# Patient Record
Sex: Female | Born: 1950
Health system: Southern US, Community
[De-identification: ages and names within clinical notes are randomized; demographics above are authoritative.]

## PROBLEM LIST (undated history)

## (undated) DIAGNOSIS — F32A Depression, unspecified: Secondary | ICD-10-CM

## (undated) DIAGNOSIS — M810 Age-related osteoporosis without current pathological fracture: Secondary | ICD-10-CM

## (undated) DIAGNOSIS — G5680 Other specified mononeuropathies of unspecified upper limb: Secondary | ICD-10-CM

## (undated) DIAGNOSIS — J189 Pneumonia, unspecified organism: Secondary | ICD-10-CM

## (undated) DIAGNOSIS — J449 Chronic obstructive pulmonary disease, unspecified: Secondary | ICD-10-CM

## (undated) DIAGNOSIS — K219 Gastro-esophageal reflux disease without esophagitis: Secondary | ICD-10-CM

## (undated) DIAGNOSIS — F329 Major depressive disorder, single episode, unspecified: Secondary | ICD-10-CM

## (undated) DIAGNOSIS — R519 Headache, unspecified: Secondary | ICD-10-CM

## (undated) DIAGNOSIS — I10 Essential (primary) hypertension: Secondary | ICD-10-CM

## (undated) DIAGNOSIS — G8929 Other chronic pain: Secondary | ICD-10-CM

## (undated) DIAGNOSIS — Z860100 Personal history of colon polyps, unspecified: Secondary | ICD-10-CM

## (undated) DIAGNOSIS — R200 Anesthesia of skin: Secondary | ICD-10-CM

## (undated) DIAGNOSIS — F419 Anxiety disorder, unspecified: Secondary | ICD-10-CM

## (undated) DIAGNOSIS — F1910 Other psychoactive substance abuse, uncomplicated: Secondary | ICD-10-CM

## (undated) DIAGNOSIS — M2550 Pain in unspecified joint: Secondary | ICD-10-CM

## (undated) DIAGNOSIS — M549 Dorsalgia, unspecified: Secondary | ICD-10-CM

## (undated) DIAGNOSIS — E785 Hyperlipidemia, unspecified: Secondary | ICD-10-CM

## (undated) DIAGNOSIS — Z8619 Personal history of other infectious and parasitic diseases: Secondary | ICD-10-CM

## (undated) DIAGNOSIS — IMO0002 Reserved for concepts with insufficient information to code with codable children: Secondary | ICD-10-CM

## (undated) DIAGNOSIS — Z8709 Personal history of other diseases of the respiratory system: Secondary | ICD-10-CM

## (undated) DIAGNOSIS — K759 Inflammatory liver disease, unspecified: Secondary | ICD-10-CM

## (undated) DIAGNOSIS — R51 Headache: Secondary | ICD-10-CM

## (undated) DIAGNOSIS — D332 Benign neoplasm of brain, unspecified: Secondary | ICD-10-CM

## (undated) DIAGNOSIS — T40601A Poisoning by unspecified narcotics, accidental (unintentional), initial encounter: Secondary | ICD-10-CM

## (undated) HISTORY — PX: COLONOSCOPY: SHX174

## (undated) HISTORY — PX: TUBAL LIGATION: SHX77

## (undated) HISTORY — PX: ANKLE FRACTURE SURGERY: SHX122

## (undated) HISTORY — PX: FACIAL FRACTURE SURGERY: SHX1570

## (undated) HISTORY — PX: KNEE SURGERY: SHX244

---

## 2000-04-19 ENCOUNTER — Other Ambulatory Visit: Admission: RE | Admit: 2000-04-19 | Discharge: 2000-04-19 | Payer: Self-pay | Admitting: Family Medicine

## 2001-03-07 ENCOUNTER — Ambulatory Visit (HOSPITAL_COMMUNITY): Admission: RE | Admit: 2001-03-07 | Discharge: 2001-03-07 | Payer: Self-pay | Admitting: Gastroenterology

## 2001-11-08 ENCOUNTER — Encounter: Admission: RE | Admit: 2001-11-08 | Discharge: 2001-11-08 | Payer: Self-pay | Admitting: Family Medicine

## 2001-11-08 ENCOUNTER — Encounter: Payer: Self-pay | Admitting: Family Medicine

## 2001-11-13 ENCOUNTER — Encounter: Admission: RE | Admit: 2001-11-13 | Discharge: 2001-12-28 | Payer: Self-pay | Admitting: Family Medicine

## 2002-02-12 ENCOUNTER — Encounter: Admission: RE | Admit: 2002-02-12 | Discharge: 2002-02-12 | Payer: Self-pay | Admitting: Orthopaedic Surgery

## 2002-02-12 ENCOUNTER — Encounter: Payer: Self-pay | Admitting: Orthopaedic Surgery

## 2002-07-03 ENCOUNTER — Encounter: Payer: Self-pay | Admitting: Emergency Medicine

## 2002-07-03 ENCOUNTER — Inpatient Hospital Stay (HOSPITAL_COMMUNITY): Admission: AC | Admit: 2002-07-03 | Discharge: 2002-07-10 | Payer: Self-pay

## 2002-07-04 ENCOUNTER — Encounter: Payer: Self-pay | Admitting: Orthopedic Surgery

## 2002-07-05 ENCOUNTER — Encounter: Payer: Self-pay | Admitting: Orthopedic Surgery

## 2002-07-10 ENCOUNTER — Inpatient Hospital Stay (HOSPITAL_COMMUNITY)
Admission: RE | Admit: 2002-07-10 | Discharge: 2002-07-16 | Payer: Self-pay | Admitting: Physical Medicine & Rehabilitation

## 2002-09-12 ENCOUNTER — Ambulatory Visit (HOSPITAL_BASED_OUTPATIENT_CLINIC_OR_DEPARTMENT_OTHER): Admission: RE | Admit: 2002-09-12 | Discharge: 2002-09-13 | Payer: Self-pay | Admitting: Orthopedic Surgery

## 2003-03-13 ENCOUNTER — Ambulatory Visit (HOSPITAL_COMMUNITY): Admission: RE | Admit: 2003-03-13 | Discharge: 2003-03-14 | Payer: Self-pay | Admitting: Orthopedic Surgery

## 2003-07-15 ENCOUNTER — Emergency Department (HOSPITAL_COMMUNITY): Admission: EM | Admit: 2003-07-15 | Discharge: 2003-07-15 | Payer: Self-pay | Admitting: Emergency Medicine

## 2003-08-08 ENCOUNTER — Inpatient Hospital Stay (HOSPITAL_COMMUNITY): Admission: RE | Admit: 2003-08-08 | Discharge: 2003-08-12 | Payer: Self-pay | Admitting: Orthopedic Surgery

## 2003-11-05 ENCOUNTER — Encounter
Admission: RE | Admit: 2003-11-05 | Discharge: 2004-02-03 | Payer: Self-pay | Admitting: Physical Medicine and Rehabilitation

## 2003-11-12 ENCOUNTER — Encounter
Admission: RE | Admit: 2003-11-12 | Discharge: 2003-11-19 | Payer: Self-pay | Admitting: Physical Medicine and Rehabilitation

## 2004-02-03 ENCOUNTER — Encounter
Admission: RE | Admit: 2004-02-03 | Discharge: 2004-05-03 | Payer: Self-pay | Admitting: Physical Medicine and Rehabilitation

## 2004-04-30 ENCOUNTER — Encounter
Admission: RE | Admit: 2004-04-30 | Discharge: 2004-07-29 | Payer: Self-pay | Admitting: Physical Medicine and Rehabilitation

## 2006-06-03 ENCOUNTER — Emergency Department (HOSPITAL_COMMUNITY): Admission: EM | Admit: 2006-06-03 | Discharge: 2006-06-03 | Payer: Self-pay | Admitting: Emergency Medicine

## 2008-02-15 ENCOUNTER — Ambulatory Visit (HOSPITAL_COMMUNITY): Payer: Self-pay | Admitting: Psychiatry

## 2008-03-12 ENCOUNTER — Ambulatory Visit (HOSPITAL_COMMUNITY): Payer: Self-pay | Admitting: Psychiatry

## 2008-03-21 ENCOUNTER — Ambulatory Visit (HOSPITAL_COMMUNITY): Admission: RE | Admit: 2008-03-21 | Discharge: 2008-03-21 | Payer: Self-pay | Admitting: Family Medicine

## 2008-04-18 ENCOUNTER — Emergency Department (HOSPITAL_COMMUNITY): Admission: EM | Admit: 2008-04-18 | Discharge: 2008-04-18 | Payer: Self-pay | Admitting: Emergency Medicine

## 2008-05-23 ENCOUNTER — Ambulatory Visit (HOSPITAL_COMMUNITY): Payer: Self-pay | Admitting: Psychiatry

## 2008-06-02 ENCOUNTER — Emergency Department (HOSPITAL_COMMUNITY): Admission: EM | Admit: 2008-06-02 | Discharge: 2008-06-02 | Payer: Self-pay | Admitting: Emergency Medicine

## 2008-06-29 ENCOUNTER — Emergency Department (HOSPITAL_COMMUNITY): Admission: EM | Admit: 2008-06-29 | Discharge: 2008-06-29 | Payer: Self-pay | Admitting: Emergency Medicine

## 2008-07-06 ENCOUNTER — Emergency Department (HOSPITAL_COMMUNITY): Admission: EM | Admit: 2008-07-06 | Discharge: 2008-07-07 | Payer: Self-pay | Admitting: Emergency Medicine

## 2009-05-16 ENCOUNTER — Emergency Department (HOSPITAL_COMMUNITY): Admission: EM | Admit: 2009-05-16 | Discharge: 2009-05-16 | Payer: Self-pay | Admitting: Emergency Medicine

## 2009-06-01 ENCOUNTER — Emergency Department (HOSPITAL_COMMUNITY): Admission: EM | Admit: 2009-06-01 | Discharge: 2009-06-01 | Payer: Self-pay | Admitting: Emergency Medicine

## 2009-06-14 ENCOUNTER — Emergency Department (HOSPITAL_COMMUNITY): Admission: EM | Admit: 2009-06-14 | Discharge: 2009-06-14 | Payer: Self-pay | Admitting: Emergency Medicine

## 2009-07-24 ENCOUNTER — Emergency Department (HOSPITAL_COMMUNITY): Admission: EM | Admit: 2009-07-24 | Discharge: 2009-07-24 | Payer: Self-pay | Admitting: Emergency Medicine

## 2009-09-18 ENCOUNTER — Emergency Department (HOSPITAL_COMMUNITY): Admission: EM | Admit: 2009-09-18 | Discharge: 2009-09-18 | Payer: Self-pay | Admitting: Emergency Medicine

## 2009-10-08 ENCOUNTER — Ambulatory Visit: Payer: Self-pay | Admitting: Physician Assistant

## 2009-10-19 ENCOUNTER — Emergency Department (HOSPITAL_COMMUNITY): Admission: EM | Admit: 2009-10-19 | Discharge: 2009-10-19 | Payer: Self-pay | Admitting: Emergency Medicine

## 2009-11-07 ENCOUNTER — Emergency Department (HOSPITAL_COMMUNITY): Admission: EM | Admit: 2009-11-07 | Discharge: 2009-11-07 | Payer: Self-pay | Admitting: Emergency Medicine

## 2009-12-06 ENCOUNTER — Emergency Department (HOSPITAL_COMMUNITY): Admission: EM | Admit: 2009-12-06 | Discharge: 2009-12-06 | Payer: Self-pay | Admitting: Emergency Medicine

## 2009-12-24 ENCOUNTER — Emergency Department (HOSPITAL_COMMUNITY): Admission: EM | Admit: 2009-12-24 | Discharge: 2009-12-24 | Payer: Self-pay | Admitting: Emergency Medicine

## 2010-01-06 ENCOUNTER — Emergency Department (HOSPITAL_COMMUNITY): Admission: EM | Admit: 2010-01-06 | Discharge: 2010-01-06 | Payer: Self-pay | Admitting: Emergency Medicine

## 2010-02-17 ENCOUNTER — Emergency Department (HOSPITAL_COMMUNITY): Admission: EM | Admit: 2010-02-17 | Discharge: 2010-02-17 | Payer: Self-pay | Admitting: Emergency Medicine

## 2010-02-25 ENCOUNTER — Emergency Department (HOSPITAL_COMMUNITY): Admission: EM | Admit: 2010-02-25 | Discharge: 2010-02-25 | Payer: Self-pay | Admitting: Emergency Medicine

## 2010-03-17 ENCOUNTER — Ambulatory Visit (HOSPITAL_COMMUNITY): Admission: RE | Admit: 2010-03-17 | Discharge: 2010-03-17 | Payer: Self-pay | Admitting: Family Medicine

## 2010-04-12 ENCOUNTER — Emergency Department (HOSPITAL_COMMUNITY): Admission: EM | Admit: 2010-04-12 | Discharge: 2010-04-12 | Payer: Self-pay | Admitting: Emergency Medicine

## 2010-07-04 ENCOUNTER — Emergency Department (HOSPITAL_COMMUNITY): Admission: EM | Admit: 2010-07-04 | Discharge: 2010-07-04 | Payer: Self-pay | Admitting: Emergency Medicine

## 2010-08-03 ENCOUNTER — Emergency Department (HOSPITAL_COMMUNITY): Admission: EM | Admit: 2010-08-03 | Discharge: 2010-08-03 | Payer: Self-pay | Admitting: Emergency Medicine

## 2010-08-24 ENCOUNTER — Emergency Department (HOSPITAL_COMMUNITY): Admission: EM | Admit: 2010-08-24 | Discharge: 2010-08-24 | Payer: Self-pay | Admitting: Emergency Medicine

## 2010-10-06 ENCOUNTER — Emergency Department (HOSPITAL_COMMUNITY): Admission: EM | Admit: 2010-10-06 | Discharge: 2010-10-06 | Payer: Self-pay | Admitting: Emergency Medicine

## 2010-10-18 ENCOUNTER — Emergency Department (HOSPITAL_COMMUNITY): Admission: EM | Admit: 2010-10-18 | Discharge: 2010-10-18 | Payer: Self-pay | Admitting: Emergency Medicine

## 2010-11-09 ENCOUNTER — Encounter
Admission: RE | Admit: 2010-11-09 | Discharge: 2010-11-17 | Payer: Self-pay | Admitting: Physical Medicine & Rehabilitation

## 2010-11-17 ENCOUNTER — Ambulatory Visit: Payer: Self-pay | Admitting: Physical Medicine & Rehabilitation

## 2010-11-18 ENCOUNTER — Emergency Department (HOSPITAL_COMMUNITY)
Admission: EM | Admit: 2010-11-18 | Discharge: 2010-11-18 | Payer: Self-pay | Source: Home / Self Care | Admitting: Emergency Medicine

## 2010-12-07 ENCOUNTER — Emergency Department (HOSPITAL_COMMUNITY)
Admission: EM | Admit: 2010-12-07 | Discharge: 2010-12-07 | Payer: Self-pay | Source: Home / Self Care | Admitting: Emergency Medicine

## 2010-12-20 ENCOUNTER — Inpatient Hospital Stay (HOSPITAL_COMMUNITY)
Admission: EM | Admit: 2010-12-20 | Discharge: 2010-12-24 | Payer: Self-pay | Source: Home / Self Care | Attending: Internal Medicine | Admitting: Internal Medicine

## 2010-12-20 DIAGNOSIS — J189 Pneumonia, unspecified organism: Secondary | ICD-10-CM

## 2010-12-20 HISTORY — DX: Pneumonia, unspecified organism: J18.9

## 2010-12-22 ENCOUNTER — Other Ambulatory Visit: Payer: Self-pay | Admitting: Internal Medicine

## 2010-12-23 LAB — CBC
HCT: 33.5 % — ABNORMAL LOW (ref 36.0–46.0)
Hemoglobin: 11.4 g/dL — ABNORMAL LOW (ref 12.0–15.0)
MCH: 31.6 pg (ref 26.0–34.0)
MCHC: 34 g/dL (ref 30.0–36.0)
MCV: 92.8 fL (ref 78.0–100.0)
Platelets: 272 10*3/uL (ref 150–400)
RBC: 3.61 MIL/uL — ABNORMAL LOW (ref 3.87–5.11)
RDW: 14.3 % (ref 11.5–15.5)
WBC: 9.2 10*3/uL (ref 4.0–10.5)

## 2010-12-23 LAB — DIFFERENTIAL
Basophils Absolute: 0.1 10*3/uL (ref 0.0–0.1)
Basophils Relative: 1 % (ref 0–1)
Eosinophils Absolute: 0.6 10*3/uL (ref 0.0–0.7)
Eosinophils Relative: 6 % — ABNORMAL HIGH (ref 0–5)
Lymphocytes Relative: 19 % (ref 12–46)
Lymphs Abs: 1.7 10*3/uL (ref 0.7–4.0)
Monocytes Absolute: 0.8 10*3/uL (ref 0.1–1.0)
Monocytes Relative: 9 % (ref 3–12)
Neutro Abs: 6.1 10*3/uL (ref 1.7–7.7)
Neutrophils Relative %: 66 % (ref 43–77)

## 2011-01-10 ENCOUNTER — Encounter: Payer: Self-pay | Admitting: Family Medicine

## 2011-01-27 ENCOUNTER — Emergency Department (HOSPITAL_COMMUNITY)
Admission: EM | Admit: 2011-01-27 | Discharge: 2011-01-27 | Disposition: A | Discharge status: Home / Self Care | Payer: MEDICARE | Attending: Emergency Medicine | Admitting: Emergency Medicine

## 2011-01-27 ENCOUNTER — Emergency Department (HOSPITAL_COMMUNITY): Payer: MEDICARE

## 2011-01-27 DIAGNOSIS — Y92009 Unspecified place in unspecified non-institutional (private) residence as the place of occurrence of the external cause: Secondary | ICD-10-CM | POA: Insufficient documentation

## 2011-01-27 DIAGNOSIS — M25579 Pain in unspecified ankle and joints of unspecified foot: Secondary | ICD-10-CM | POA: Insufficient documentation

## 2011-01-27 DIAGNOSIS — X500XXA Overexertion from strenuous movement or load, initial encounter: Secondary | ICD-10-CM | POA: Insufficient documentation

## 2011-02-04 ENCOUNTER — Emergency Department (HOSPITAL_COMMUNITY)
Admission: EM | Admit: 2011-02-04 | Discharge: 2011-02-04 | Disposition: A | Discharge status: Home / Self Care | Payer: MEDICARE | Attending: Emergency Medicine | Admitting: Emergency Medicine

## 2011-02-04 DIAGNOSIS — M25579 Pain in unspecified ankle and joints of unspecified foot: Secondary | ICD-10-CM | POA: Insufficient documentation

## 2011-03-01 LAB — PROTIME-INR
INR: 1.1 (ref 0.00–1.49)
Prothrombin Time: 14.4 seconds (ref 11.6–15.2)

## 2011-03-01 LAB — LEGIONELLA ANTIGEN, URINE: Legionella Antigen, Urine: NEGATIVE

## 2011-03-01 LAB — DIFFERENTIAL
Basophils Absolute: 0 K/uL (ref 0.0–0.1)
Basophils Absolute: 0.1 10*3/uL (ref 0.0–0.1)
Basophils Relative: 0 % (ref 0–1)
Basophils Relative: 0 % (ref 0–1)
Eosinophils Absolute: 0.4 K/uL (ref 0.0–0.7)
Eosinophils Relative: 3 % (ref 0–5)
Lymphocytes Relative: 14 % (ref 12–46)
Lymphs Abs: 1.6 K/uL (ref 0.7–4.0)
Lymphs Abs: 2.1 10*3/uL (ref 0.7–4.0)
Monocytes Absolute: 0.7 K/uL (ref 0.1–1.0)
Monocytes Relative: 6 % (ref 3–12)
Neutro Abs: 8.8 K/uL — ABNORMAL HIGH (ref 1.7–7.7)
Neutrophils Relative %: 76 % (ref 43–77)

## 2011-03-01 LAB — CBC
HCT: 31.3 % — ABNORMAL LOW (ref 36.0–46.0)
HCT: 35.7 % — ABNORMAL LOW (ref 36.0–46.0)
Hemoglobin: 10.4 g/dL — ABNORMAL LOW (ref 12.0–15.0)
Hemoglobin: 12 g/dL (ref 12.0–15.0)
MCH: 31 pg (ref 26.0–34.0)
MCHC: 33.2 g/dL (ref 30.0–36.0)
MCHC: 33.6 g/dL (ref 30.0–36.0)
MCV: 93.4 fL (ref 78.0–100.0)
MCV: 93.9 fL (ref 78.0–100.0)
Platelets: 211 10*3/uL (ref 150–400)
RBC: 3.35 MIL/uL — ABNORMAL LOW (ref 3.87–5.11)
RBC: 3.8 MIL/uL — ABNORMAL LOW (ref 3.87–5.11)
RDW: 14.3 % (ref 11.5–15.5)

## 2011-03-01 LAB — RENAL FUNCTION PANEL
CO2: 25 mEq/L (ref 19–32)
Calcium: 8.4 mg/dL (ref 8.4–10.5)
Glucose, Bld: 104 mg/dL — ABNORMAL HIGH (ref 70–99)
Sodium: 144 mEq/L (ref 135–145)

## 2011-03-01 LAB — CULTURE, BLOOD (ROUTINE X 2)
Culture  Setup Time: 201201012053
Culture  Setup Time: 201201012053
Culture: NO GROWTH
Culture: NO GROWTH

## 2011-03-01 LAB — STREP PNEUMONIAE URINARY ANTIGEN: Strep Pneumo Urinary Antigen: NEGATIVE

## 2011-03-01 LAB — BASIC METABOLIC PANEL
GFR calc non Af Amer: >60 mL/min (ref >60)
Glucose, Bld: 91 mg/dL (ref 70–99)
Sodium: 140 mEq/L (ref 135–145)

## 2011-03-01 LAB — URINALYSIS, ROUTINE W REFLEX MICROSCOPIC
Bilirubin Urine: NEGATIVE
Glucose, UA: NEGATIVE mg/dL
Hgb urine dipstick: NEGATIVE
Protein, ur: NEGATIVE mg/dL
Specific Gravity, Urine: 1.006 (ref 1.005–1.030)
Urobilinogen, UA: 0.2 mg/dL (ref 0.0–1.0)

## 2011-03-01 LAB — PROCALCITONIN

## 2011-03-01 LAB — BRAIN NATRIURETIC PEPTIDE: Pro B Natriuretic peptide (BNP): 72 pg/mL (ref 0.0–100.0)

## 2011-03-01 LAB — URINE CULTURE
Colony Count: NO GROWTH
Culture  Setup Time: 201201012121

## 2011-03-01 LAB — COMPREHENSIVE METABOLIC PANEL
ALT: 25 U/L (ref 0–35)
GFR calc Af Amer: >60 mL/min (ref >60)
Sodium: 143 mEq/L (ref 135–145)
Total Bilirubin: 0.3 mg/dL (ref 0.3–1.2)

## 2011-03-01 LAB — LIPID PANEL
Triglycerides: 85 mg/dL (ref <150)
VLDL: 17 mg/dL (ref 0–40)

## 2011-03-01 LAB — LACTIC ACID, PLASMA
Lactic Acid, Venous: 0.7 mmol/L (ref 0.5–2.2)
Lactic Acid, Venous: 1.8 mmol/L (ref 0.5–2.2)

## 2011-03-01 LAB — APTT: aPTT: 33 seconds (ref 24–37)

## 2011-04-01 ENCOUNTER — Emergency Department (HOSPITAL_COMMUNITY)
Admission: EM | Admit: 2011-04-01 | Discharge: 2011-04-01 | Disposition: A | Discharge status: Home / Self Care | Payer: MEDICARE | Attending: Emergency Medicine | Admitting: Emergency Medicine

## 2011-04-01 DIAGNOSIS — M549 Dorsalgia, unspecified: Secondary | ICD-10-CM | POA: Insufficient documentation

## 2011-04-01 DIAGNOSIS — E7800 Pure hypercholesterolemia, unspecified: Secondary | ICD-10-CM | POA: Insufficient documentation

## 2011-04-01 DIAGNOSIS — M25579 Pain in unspecified ankle and joints of unspecified foot: Secondary | ICD-10-CM | POA: Insufficient documentation

## 2011-04-01 DIAGNOSIS — F329 Major depressive disorder, single episode, unspecified: Secondary | ICD-10-CM | POA: Insufficient documentation

## 2011-04-01 DIAGNOSIS — F3289 Other specified depressive episodes: Secondary | ICD-10-CM | POA: Insufficient documentation

## 2011-04-01 DIAGNOSIS — M810 Age-related osteoporosis without current pathological fracture: Secondary | ICD-10-CM | POA: Insufficient documentation

## 2011-04-01 DIAGNOSIS — G8929 Other chronic pain: Secondary | ICD-10-CM | POA: Insufficient documentation

## 2011-04-01 DIAGNOSIS — I10 Essential (primary) hypertension: Secondary | ICD-10-CM | POA: Insufficient documentation

## 2011-05-07 NOTE — Assessment & Plan Note (Signed)
[name] is back in for a recheck today.  Her problem list includes the  following:  1. Cervical stenosis.  2. Chronic low back pain.  3. Right pylon fracture, status post revision.  4. Bilateral carpal tunnel syndrome.   Current medications include:  1. Effexor 75 mg p.o. b.i.d.  2. Tylox 5/500, 2 daily.  3. OxyContin 20 mg b.i.d.  4. Xanax 2 mg b.i.d. to 3 times daily.   She recently called in for a refill on her medications on December 17, 2003.  At that time OxyContin was referred as well as the Tylox.  She was given 62  units each.   Overall, her pain scores have improved since her last visit.  Her average  daily pain is about a 5, and prior to that her average daily pain had been a  7.  She reports her best time of day is after lunch.  She has been staying  quite active.  She stays very busy and was especially busy during the  holidays.  Her back pain is located right across the low back area just  above the gluteal crevice.   On exam today her blood pressure was 124/65, pulse 67, 99% saturation on  room air.  She appeared comfortable as she talked to me during our  interview.  Her mood was bright.  She was able to get out of the chair  easily.  Her right lower extremity, again, does impede her normal gait, and  she has somewhat of an antalgic gait.  She has limitation, especially in  extension, with her lumbar mobility.  Seated, her reflexes were 2+ at the  knees, 1+ at the ankles.  Toes are downgoing.  There is no clonus noted.  Sensation was intact, and motor strength was 5/5.   IMPRESSION:  Overall stable with pain management on current medications.  Will continue these medications.  We will see her back again in 4-6 weeks.  She noted also during the interview that she is going to be seeing Dr. Lajoyce Corners  on December 30, 2003, for him to look at her right ankle again.      Brantley Stage, M.D.   DMK/MedQ  D:  12/25/2003 18:22:48  T:  12/26/2003 06:55:20  Job  #:  [id number]

## 2011-05-07 NOTE — Op Note (Signed)
   [name], [name]                        ACCOUNT NO.:  [id number]   MEDICAL RECORD NO.:  [id number]                   PATIENT TYPE:  AMB   LOCATION:  DSC                                  FACILITY:  [facility]   PHYSICIAN:  Danise Edge, M.D.                DATE OF BIRTH:  Sep 08, 1951   DATE OF PROCEDURE:  09/26/2002  DATE OF DISCHARGE:  09/13/2002                                 OPERATIVE REPORT   PROCEDURE PERFORMED:  Esophagogastroduodenoscopy.   REFERRING PHYSICIAN:  Richard A. Jacky Kindle, M.D.   ENDOSCOPIST:  Charolett Bumpers, M.D.   INDICATIONS FOR PROCEDURE:  The patient is a 60 year old female born May 29, 1933.  [name] was admitted through the [facility] emergency  department last night with a bleeding gastric cardia, Dieulafoy's lesion.  I  was able to inject this arterial bleeder with epinephrine through a  sclerotherapy needle and applied endo clips.  Unfortunately, the endoscopic  view of this lesion is tangential making endoclipping difficult and probably  somewhat ineffective.  The patient redeveloped upper gastrointestinal  bleeding this morning.   PROCEDURE PERFORMED:  Esophagogastroduodenoscopy.   ENDOSCOPIST:  Charolett Bumpers, M.D.   PREMEDICATION:  Fentanyl 25 mcg, Versed 5 mg.   INSTRUMENT USED:  Olympus gastroscope.   DESCRIPTION OF PROCEDURE:  The patient was placed in the left lateral  decubitus position.  I administered fentanyl and intravenous Versed to  achieve conscious sedation for the procedure.  The patient's blood pressure,  oxygen saturations and cardiac rhythm were monitored throughout the  procedure and documented in the medical record.   The Olympus gastroscope was passed through the posterior hypopharynx and the  proximal esophagus without difficulty.  The hypopharynx, larynx and vocal  cords appeared normal.   Esophagoscopy:  The proximal, mid and lower segments of the esophagus appear  normal.   Gastroscopy:  Upon  entering the stomach, the stomach is filled with fresh  blood and blood clots.  I was able to identify the gastric cardia,  Dieulafoy's lesion on the greater curvature aspect with endo clips in place.  An additional endo clip was applied.  The gastric body, antrum and pylorus  appeared normal.   Duodenoscopy:  The duodenal bulb and descending duodenum appeared normal.   PLAN:  Dr. Ezzard Standing is at the patient's bedside and gastric surgery is planned  on an emergent basis today.                                               Danise Edge, M.D.    MJ/MEDQ  D:  09/26/2002  T:  09/26/2002  Job:  [id number]
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
## 2011-05-07 NOTE — Discharge Summary (Signed)
   [name], [name]                        ACCOUNT NO.:  [id number]   MEDICAL RECORD NO.:  [id number]                   PATIENT TYPE:  INP   LOCATION:  5004                                 FACILITY:  [facility]   PHYSICIAN:  Nadara Mustard, M.D.                DATE OF BIRTH:  Jan 16, 1951   DATE OF ADMISSION:  08/08/2003  DATE OF DISCHARGE:  08/12/2003                                 DISCHARGE SUMMARY   DIAGNOSIS:  Osteomyelitis and abscess with retained hardware, right ankle.   PROCEDURE:  Incision and drainage of skin and soft tissue, removal of  infected bone, removal of hardware.   DISPOSITION:  Discharged to home in stable condition.   HISTORY OF PRESENT ILLNESS:  The patient is a 60 year old woman who is  status post subtalar fusion with painful retained hardware. Radiograph shows  lytic changes around the retained hardware, shows a stable fusion, and she  presents at this time for removal of retained hardware, cultures, and IV  antibiotics.   HOSPITAL COURSE:  The patient's hospital course was essentially  unremarkable. She underwent irrigation and debridement of skin and soft  tissue with removal of infected bone and removal of retained hardware on  August 19. Cultures were obtained x2. Tourniquet time none. The patient was  placed on IV Zosyn postoperatively, Kefzol preoperatively. Cultures were  negative on postoperative day #1 and remained negative throughout her  hospital stay. The patient was ambulating with physical therapy  approximately 100 feet. She was kept on Zosyn during her hospital course.  The drain was removed. The dressing was removed. The incision was clean and  dry. She was discharged to home in stable condition with a prescription for  Tylox. Plan to followup in one week. The patient will continue to use her  Cipro 500 mg two times a day after discharge. Followup in one week. Cultures  remained negative through date of discharge.                                 Nadara Mustard, M.D.    MVD/MEDQ  D:  09/10/2003  T:  09/11/2003  Job:  [id number]
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
## 2011-05-07 NOTE — Assessment & Plan Note (Signed)
REFERRING PHYSICIAN:  Nadara Mustard, M.D.   REASON FOR VISIT:  [name] is in for a recheck today.  She is being  followed for her chronic low back pain.  She reports that she has been a  little bit worse.  Her right foot was recently taken out of the cast boot.  She is status post a pylon fracture with revision for nonunion and she  reports it has been a little stiff and achy since it has been out of the  boot.  She has also been attending physical therapy and with the holidays  coming up already she feels a little overwhelmed with all that she has to do  with respect to Christmas and her daily activities and is wondering if we  might be able to give her the physical therapy for a month.   Today she denies any new problems.  No new numbness, tingling, weakness.  No  bowel or bladder problems.  Her gait is somewhat stiffer because of her  right foot.  She has been taking the OxyContin 20 mg p.o. b.i.d.  It helps  for several hours and she is sleeping okay when she takes it at 6 p.m. at  night, she has no problems during the night; she takes it also at 6 a.m.  It  is during the middle part of the day she is having some trouble.   She is also saying she has been a lot more anxious with the holidays coming  and is wondering if she can return back to her original dose of Xanax.   On exam she appears comfortable as she sits talking to me in the room today.  Her mood appears bright.  She is able to get out of the chair.  Her right  lower extremity is obviously bothering her somewhat as she walks as she has  a slightly antalgic gait and her right foot appears to be somewhat stiff as  she bears weight on it.  Back in the seated position her reflexes were 2+ at  the knees, 1+ at the ankles, toes are downgoing, no clonus is noted,  sensation is intact.  Motor strength is 5/5.   IMPRESSION:  1. Cervical stenosis.  2. Chronic low back pain.  3. Right pylon fracture status post  revision.  4. History of bilateral carpal tunnel syndrome.   PLAN:  Will continue her on OxyContin 20 mg p.o. b.i.d.  Will give her Tylox  5/500 one to two p.o. daily.  Will also refill her Xanax 2 mg one p.o.  t.i.d. p.r.n.  Will see her in about four weeks.  At that time we will  consider having her set up to see Dr. Leonides Cave to learn how to pace herself  and for an evaluation.  Also consider having her return to physical therapy  in about a month and at that time will have them finish up her home program  education as well as work on Estate manager/land agent and pacing issues.   MEDICATIONS WRITTEN FOR TODAY:  1. Xanax 2 mg one p.o. t.i.d. #93 units.  2. Tylox 5/500 one to two p.o. daily p.r.n. #62 units.  3. OxyContin 20 mg one p.o. q.12h. #62 units.      Brantley Stage, M.D.   DMK/MedQ  D:  11/20/2003 13:16:54  T:  11/20/2003 13:53:24  Job #:  [id number]   cc:   Nadara Mustard, M.D.  [address]  [place]  [place] [address]  Fax: [phone]

## 2011-05-07 NOTE — Assessment & Plan Note (Signed)
PROBLEM LIST:  1. Cervical stenosis.  2. Chronic low back pain.  3. Status post right pylon fracture.  4. History of bilateral carpal tunnel syndrome.   HISTORY OF PRESENT ILLNESS:  [name] ran out of her medications  approximately 2 days ago. She is somewhat upset in the office today but is  not experiencing any kind of severe withdrawal symptoms at this time. She  tells me that she will be following up with Dr. Charm Barges with an appointment  on Monday. Overall, she has been doing well. She has continued to be able to  negotiate her stairs, do her cleaning, and grocery shopping and the rest of  her activities of daily living without much difficulty. At the last visit,  she tells me that she had had some of her medications taken and apparently  was at a [employer] party.   PHYSICAL EXAMINATION:  VITAL SIGNS:  Blood pressure 125/77, pulse 82. She is  95% saturated on room air.  GENERAL:  She is appropriate, cooperative, somewhat anxious. However,  otherwise in no apparent distress.  SKIN:  Unremarkable.  NEUROLOGIC:  She is able to get off the examination table easily. Her gait  looks pretty good in the office today. No antalgia at all and weight bearing  fairly equally during the gait cycle. Seated, her reflexes are 2+ at the  upper extremities at biceps, triceps, brachial radialis and 2+ at the lower  extremities at the patellar tendons and Achilles tendon. Toes are downgoing.  No clonus is noted.  NECK:  She has good cervical range of motion. No adenopathy noted.  HEART:  Regular rhythm.  LUNGS:  Clear.  ABDOMEN:  Benign.  EXTREMITIES:  Her motor strength in the upper and lower extremities is in  the 5 over 5 range without any side-to-side weakness. Mild calf atrophy is  noted in the right lower extremity. No edema is noted. No significant  tenderness to palpation today in the right lower extremity.   PLAN:  Will be switching patient from OxyContin to Methadone 5 mg 1 p.o.  q.  12 hours, #60 tablets given. Will also give her Ultracet 2 tablets p.o. q. 4  hours p.r.n. #84, and Paxil 20 mg 1 p.o. q. a.m. #30. I discussed using  Methadone at length with her. I have told her that it is important that she  take it as directed, that as with any of the opiates, there is a possibility  if it is taken incorrectly, complications could arise including respiratory  depression and possibly death. She understands the risks and benefits of the  Methadone and would like to try it and she will give [place] a call back in about  2 weeks and let us know how she is doing. We may add a third dose, as we  titrate her on this particular medication. She was given some Ultracet to  bridge her while she starts on the Methadone. I will see her back in 1 month  otherwise.      Brantley Stage, M.D.   DMK/MedQ  D:  03/06/2004 18:44:24  T:  03/07/2004 14:15:52  Job #:  [id number]

## 2011-05-14 ENCOUNTER — Emergency Department (HOSPITAL_COMMUNITY)
Admission: EM | Admit: 2011-05-14 | Discharge: 2011-05-14 | Disposition: A | Discharge status: Home / Self Care | Payer: Medicare Other | Attending: Emergency Medicine | Admitting: Emergency Medicine

## 2011-05-14 DIAGNOSIS — K219 Gastro-esophageal reflux disease without esophagitis: Secondary | ICD-10-CM | POA: Insufficient documentation

## 2011-05-14 DIAGNOSIS — Z79899 Other long term (current) drug therapy: Secondary | ICD-10-CM | POA: Insufficient documentation

## 2011-05-14 DIAGNOSIS — M810 Age-related osteoporosis without current pathological fracture: Secondary | ICD-10-CM | POA: Insufficient documentation

## 2011-05-14 DIAGNOSIS — E7800 Pure hypercholesterolemia, unspecified: Secondary | ICD-10-CM | POA: Insufficient documentation

## 2011-05-14 DIAGNOSIS — I10 Essential (primary) hypertension: Secondary | ICD-10-CM | POA: Insufficient documentation

## 2011-05-14 DIAGNOSIS — IMO0002 Reserved for concepts with insufficient information to code with codable children: Secondary | ICD-10-CM | POA: Insufficient documentation

## 2011-05-14 DIAGNOSIS — J40 Bronchitis, not specified as acute or chronic: Secondary | ICD-10-CM | POA: Insufficient documentation

## 2011-05-14 DIAGNOSIS — F411 Generalized anxiety disorder: Secondary | ICD-10-CM | POA: Insufficient documentation

## 2011-09-16 LAB — URINALYSIS, ROUTINE W REFLEX MICROSCOPIC
Nitrite: NEGATIVE
Specific Gravity, Urine: 1.008
Urobilinogen, UA: 0.2

## 2011-12-18 ENCOUNTER — Encounter: Payer: Self-pay | Admitting: *Deleted

## 2011-12-18 ENCOUNTER — Emergency Department (HOSPITAL_COMMUNITY)
Admission: EM | Admit: 2011-12-18 | Discharge: 2011-12-18 | Disposition: A | Discharge status: Home / Self Care | Payer: Medicare Other | Attending: Emergency Medicine | Admitting: Emergency Medicine

## 2011-12-18 DIAGNOSIS — M545 Low back pain, unspecified: Secondary | ICD-10-CM | POA: Insufficient documentation

## 2011-12-18 DIAGNOSIS — IMO0002 Reserved for concepts with insufficient information to code with codable children: Secondary | ICD-10-CM | POA: Insufficient documentation

## 2011-12-18 DIAGNOSIS — M79609 Pain in unspecified limb: Secondary | ICD-10-CM | POA: Insufficient documentation

## 2011-12-18 DIAGNOSIS — E785 Hyperlipidemia, unspecified: Secondary | ICD-10-CM | POA: Insufficient documentation

## 2011-12-18 DIAGNOSIS — M62838 Other muscle spasm: Secondary | ICD-10-CM

## 2011-12-18 DIAGNOSIS — M5380 Other specified dorsopathies, site unspecified: Secondary | ICD-10-CM | POA: Insufficient documentation

## 2011-12-18 DIAGNOSIS — G8929 Other chronic pain: Secondary | ICD-10-CM

## 2011-12-18 DIAGNOSIS — I10 Essential (primary) hypertension: Secondary | ICD-10-CM | POA: Insufficient documentation

## 2011-12-18 DIAGNOSIS — F17200 Nicotine dependence, unspecified, uncomplicated: Secondary | ICD-10-CM | POA: Insufficient documentation

## 2011-12-18 HISTORY — DX: Hyperlipidemia, unspecified: E78.5

## 2011-12-18 HISTORY — DX: Essential (primary) hypertension: I10

## 2011-12-18 HISTORY — DX: Reserved for concepts with insufficient information to code with codable children: IMO0002

## 2011-12-18 HISTORY — DX: Benign neoplasm of brain, unspecified: D33.2

## 2011-12-18 HISTORY — DX: Other specified mononeuropathies of unspecified upper limb: G56.80

## 2011-12-18 MED ORDER — OXYCODONE-ACETAMINOPHEN 5-325 MG PO TABS: 1.0000 | ORAL_TABLET | Freq: Four times a day (QID) | ORAL | Status: AC | PRN

## 2011-12-18 MED ORDER — CYCLOBENZAPRINE HCL 10 MG PO TABS: 10.0000 mg | ORAL_TABLET | Freq: Two times a day (BID) | ORAL | Status: AC | PRN

## 2011-12-18 MED ORDER — OXYCODONE-ACETAMINOPHEN 5-325 MG PO TABS
1.0000 | ORAL_TABLET | Freq: Once | ORAL | Status: AC
  Administered 2011-12-18: 1 via ORAL
  Filled 2011-12-18: qty 1

## 2011-12-18 NOTE — ED Notes (Signed)
Pt lifted a church pew a couple of weeks ago , felt something "pop" in her lower back, symptoms are getting worse

## 2011-12-18 NOTE — ED Provider Notes (Signed)
History     CSN: [id number]  Arrival date & time 12/18/11  1659   First MD Initiated Contact with Patient 12/18/11 1833      Chief Complaint  Patient presents with  . Back Pain    (Consider location/radiation/quality/duration/timing/severity/associated sxs/prior treatment) HPI Comments: Patient with history of chronic right lower extremity and lower back pain -- presents with worsening pain in her right lower back without radiation for the past 10 days after lifting a pew at church. She states her back popped when she lifted. Pain is typical for her. Patient denies all red flag signs and symptoms of lower back pain except for age greater than 50. Patient admits to using more of her oxycodone than she's been prescribed. She is now out of this medication.   Patient is a 60 y.o. female presenting with back pain. The history is provided by the patient.  Back Pain  This is a chronic problem. The current episode started more than 1 week ago. The problem occurs constantly. The pain is associated with lifting heavy objects. The pain is present in the lumbar spine. The quality of the pain is described as cramping. The pain does not radiate. The symptoms are aggravated by twisting and bending. Pertinent negatives include no fever, no numbness, no weight loss, no bowel incontinence, no perianal numbness, no dysuria, no pelvic pain, no leg pain, no paresthesias, no tingling and no weakness.    Past Medical History  Diagnosis Date  . Disc degeneration   . Hypertension   . Hyperlipemia   . Brain tumor (benign)   . Pinched nerve in shoulder     Past Surgical History  Procedure Date  . Knee surgery   . Facial fracture surgery     No family history on file.  History  Substance Use Topics  . Smoking status: Current Everyday Smoker  . Smokeless tobacco: Not on file  . Alcohol Use: No    OB History    Grav Para Term Preterm Abortions TAB SAB Ect Mult Living                  Review of  Systems  Constitutional: Negative for fever and weight loss.  Gastrointestinal: Negative for constipation and bowel incontinence.       Negative for fecal incontinence.   Genitourinary: Negative for dysuria, hematuria, flank pain and pelvic pain.       Negative for urinary incontinence or retention.  Musculoskeletal: Positive for back pain. Negative for myalgias.  Skin: Negative for rash.  Neurological: Negative for tingling, weakness, numbness and paresthesias.       Denies saddle paresthesias.    Allergies  Review of patient's allergies indicates no known allergies.  Home Medications  No current outpatient prescriptions on file.  BP 126/79  Pulse 89  Temp(Src) 97.8 F (36.6 C) (Oral)  Resp 18  SpO2 95%  Physical Exam  Nursing note and vitals reviewed. Constitutional: She is oriented to person, place, and time. She appears well-developed and well-nourished.  HENT:  Head: Normocephalic and atraumatic.  Eyes: Conjunctivae are normal.  Neck: Normal range of motion. Neck supple.  Pulmonary/Chest: Effort normal.  Abdominal: Soft. There is no tenderness.  Musculoskeletal: Normal range of motion.       No tenderness to palpation over cervical/thoracic/lumbar/sacral cervical/thoracic/lumbar paraspinal muscles with spasm on right. No step-off noted with palpation of spine.   Neurological: She is alert and oriented to person, place, and time. She has normal reflexes.  Skin:  Skin is warm and dry. No rash noted.  Psychiatric: She has a normal mood and affect.    ED Course  Procedures (including critical care time)  Labs Reviewed - No data to display No results found.   1. Chronic back pain   2. Muscle spasm     7:26 PM patient seen and examined. Discussed the dangers of overusing prescribe narcotic medications. Patient was counseled on back pain precautions and told to do activity as tolerated but do not lift, push, or pull heavy objects more than 10 pounds.  Patient  counseled to use ice or heat on back for no longer than 15 minutes every hour.  Questions answered.  Patient verbalized understanding.    7:26 PM Patient counseled on use of narcotic pain medications. Counseled not to combine these medications with others containing tylenol. Urged not to drink alcohol, drive, or perform any other activities that requires focus while taking these medications. The patient verbalizes understanding and agrees with the plan.  7:26 PM Patient counseled on proper use of muscle relaxant medication.  They were told not to drink alcohol, drive any vehicle, or do any dangerous activities while taking this medication.  Patient verbalized understanding.    MDM  Patient with back pain.  No neurological deficits and normal neuro exam.  Patient can walk but states is painful.  No loss of bowel or bladder control.  No concern for cauda equina.  No fever, night sweats, weight loss, h/o cancer, IVDU.  RICE protocol and pain medicine indicated.            [name] [place], [place] 12/18/11 1929

## 2011-12-19 NOTE — ED Provider Notes (Signed)
Medical screening examination/treatment/procedure(s) were performed by non-physician practitioner and as supervising physician I was immediately available for consultation/collaboration.    Melanie Openshaw L Jakobee Brackins, MD 12/19/11 1124 

## 2012-09-01 ENCOUNTER — Emergency Department (HOSPITAL_COMMUNITY): Payer: Medicare Other

## 2012-09-01 ENCOUNTER — Emergency Department (HOSPITAL_COMMUNITY)
Admission: EM | Admit: 2012-09-01 | Discharge: 2012-09-01 | Disposition: A | Discharge status: Home / Self Care | Payer: Medicare Other | Attending: Emergency Medicine | Admitting: Emergency Medicine

## 2012-09-01 ENCOUNTER — Encounter (HOSPITAL_COMMUNITY): Payer: Self-pay | Admitting: *Deleted

## 2012-09-01 DIAGNOSIS — E785 Hyperlipidemia, unspecified: Secondary | ICD-10-CM | POA: Insufficient documentation

## 2012-09-01 DIAGNOSIS — I10 Essential (primary) hypertension: Secondary | ICD-10-CM | POA: Insufficient documentation

## 2012-09-01 DIAGNOSIS — J209 Acute bronchitis, unspecified: Secondary | ICD-10-CM

## 2012-09-01 DIAGNOSIS — F17200 Nicotine dependence, unspecified, uncomplicated: Secondary | ICD-10-CM | POA: Insufficient documentation

## 2012-09-01 DIAGNOSIS — J42 Unspecified chronic bronchitis: Secondary | ICD-10-CM | POA: Insufficient documentation

## 2012-09-01 MED ORDER — PREDNISONE 20 MG PO TABS
40.0000 mg | ORAL_TABLET | Freq: Once | ORAL | Status: AC
  Administered 2012-09-01: 40 mg via ORAL
  Filled 2012-09-01: qty 2

## 2012-09-01 MED ORDER — IPRATROPIUM BROMIDE 0.02 % IN SOLN
0.5000 mg | Freq: Once | RESPIRATORY_TRACT | Status: AC
  Administered 2012-09-01: 0.5 mg via RESPIRATORY_TRACT
  Filled 2012-09-01: qty 2.5

## 2012-09-01 MED ORDER — ALBUTEROL SULFATE (5 MG/ML) 0.5% IN NEBU
5.0000 mg | INHALATION_SOLUTION | Freq: Once | RESPIRATORY_TRACT | Status: AC
  Administered 2012-09-01: 5 mg via RESPIRATORY_TRACT
  Filled 2012-09-01: qty 1

## 2012-09-01 MED ORDER — PREDNISONE 10 MG PO TABS: 20.0000 mg | ORAL_TABLET | Freq: Every day | ORAL | Status: DC

## 2012-09-01 MED ORDER — AZITHROMYCIN 250 MG PO TABS: ORAL_TABLET | ORAL | Status: DC

## 2012-09-01 MED ORDER — ALBUTEROL SULFATE HFA 108 (90 BASE) MCG/ACT IN AERS: 2.0000 | INHALATION_SPRAY | RESPIRATORY_TRACT | Status: DC | PRN

## 2012-09-01 NOTE — ED Notes (Signed)
Pt reports that she has had a cough for 2 days, feeling worse today. Coughing up green mucus.  Unsure of fever.

## 2012-09-01 NOTE — ED Provider Notes (Signed)
History     CSN: [id number]  Arrival date & time 09/01/12  1439   First MD Initiated Contact with Patient 09/01/12 1504      Chief Complaint  Patient presents with  . Cough    (Consider location/radiation/quality/duration/timing/severity/associated sxs/prior treatment) HPI  Past Medical History  Diagnosis Date  . Disc degeneration   . Hypertension   . Hyperlipemia   . Brain tumor (benign)   . Pinched nerve in shoulder     Past Surgical History  Procedure Date  . Knee surgery   . Facial fracture surgery     History reviewed. No pertinent family history.  History  Substance Use Topics  . Smoking status: Current Every Day Smoker  . Smokeless tobacco: Not on file  . Alcohol Use: No    OB History    Grav Para Term Preterm Abortions TAB SAB Ect Mult Living                  Review of Systems  Allergies  Review of patient's allergies indicates no known allergies.  Home Medications   Current Outpatient Rx  Name Route Sig Dispense Refill  . ALBUTEROL SULFATE HFA 108 (90 BASE) MCG/ACT IN AERS Inhalation Inhale 2 puffs into the lungs every 6 (six) hours as needed. For wheezing     . ALENDRONATE SODIUM 70 MG PO TABS Oral Take 70 mg by mouth every 7 (seven) days. On mondays. Take with a full glass of water on an empty stomach.     . AMLODIPINE BESY-BENAZEPRIL HCL 5-10 MG PO CAPS Oral Take 1 capsule by mouth daily.    Marland Kitchen DIAZEPAM 10 MG PO TABS Oral Take 10 mg by mouth every 8 (eight) hours as needed. For anxiety     . GABAPENTIN 600 MG PO TABS Oral Take 600 mg by mouth 3 (three) times daily.      . OXYCODONE-ACETAMINOPHEN 7.5-500 MG PO TABS Oral Take 1 tablet by mouth every 8 (eight) hours as needed. For pain     . PRAVASTATIN SODIUM 20 MG PO TABS Oral Take 20 mg by mouth daily.    . VENLAFAXINE HCL ER 150 MG PO CP24 Oral Take 300 mg by mouth daily.      Marland Kitchen VITAMIN D (ERGOCALCIFEROL) 50000 UNITS PO CAPS Oral Take 50,000 Units by mouth every 7 (seven) days. On mondays      . ALBUTEROL SULFATE HFA 108 (90 BASE) MCG/ACT IN AERS Inhalation Inhale 2 puffs into the lungs every 4 (four) hours as needed for wheezing. 1 Inhaler 0  . AZITHROMYCIN 250 MG PO TABS  2 tabs the first day then one tab daily until gone 6 tablet 0  . PREDNISONE 10 MG PO TABS Oral Take 2 tablets (20 mg total) by mouth daily. 10 tablet 0    BP 119/76  Pulse 76  Temp 98.6 F (37 C) (Oral)  Resp 20  Ht 5\' 2"  (1.575 m)  Wt 147 lb (66.679 kg)  BMI 26.89 kg/m2  SpO2 90%  Physical Exam  ED Course  Procedures (including critical care time)  Labs Reviewed - No data to display Dg Chest 2 View  09/01/2012  *RADIOLOGY REPORT*  Clinical Data: Cough.  CHEST - 2 VIEW  Comparison: 12/20/2010  Findings: Heart and mediastinal contours are within normal limits. No focal opacities or effusions.  No acute bony abnormality.  IMPRESSION: No active cardiopulmonary disease.   Original Report Authenticated By: Cyndie Chime, M.D.  1. Chronic bronchitis with acute exacerbation       MDM  no PNA F/u with PCP in [place] in next few days. rx-prednisone 40 mg QD x 5 days Albuterol HFA rx-Z-pack        Evalina Field, PA 09/01/12 1835

## 2012-09-01 NOTE — ED Notes (Signed)
Cough , productive green sputum, .

## 2012-09-01 NOTE — ED Provider Notes (Signed)
History     CSN: [id number]  Arrival date & time 09/01/12  1439   First MD Initiated Contact with Patient 09/01/12 1504      Chief Complaint  Patient presents with  . Cough    HPI [name] is a 61 y.o. female who presents to the ED with cough. The cough started 2 days ago. She describes the cough as productive with yellow green sputum. Associated symptom include chills, ? Fever, wheezing and tightness in chest. Hx of bronchitis, using inhaler without relief.   Past Medical History  Diagnosis Date  . Disc degeneration   . Hypertension   . Hyperlipemia   . Brain tumor (benign)   . Pinched nerve in shoulder     Past Surgical History  Procedure Date  . Knee surgery   . Facial fracture surgery     History reviewed. No pertinent family history.  History  Substance Use Topics  . Smoking status: Current Every Day Smoker  . Smokeless tobacco: Not on file  . Alcohol Use: No    OB History    Grav Para Term Preterm Abortions TAB SAB Ect Mult Living                  Review of Systems  Constitutional: Negative for fever and chills.  HENT: Positive for congestion.   Eyes: Negative.   Respiratory: Positive for cough, chest tightness, shortness of breath and wheezing.   Cardiovascular: Negative for chest pain and palpitations.  Gastrointestinal: Negative for nausea and abdominal pain.  Musculoskeletal: Positive for back pain (with cough).  Skin: Negative for rash.  Neurological: Negative for dizziness and seizures.  Psychiatric/Behavioral: Negative for confusion and agitation.    Allergies  Review of patient's allergies indicates no known allergies.  Home Medications   Current Outpatient Rx  Name Route Sig Dispense Refill  . ALBUTEROL SULFATE HFA 108 (90 BASE) MCG/ACT IN AERS Inhalation Inhale 2 puffs into the lungs every 6 (six) hours as needed. For wheezing     . ALENDRONATE SODIUM 70 MG PO TABS Oral Take 70 mg by mouth every 7 (seven) days. On mondays.  Take with a full glass of water on an empty stomach.     . AMLODIPINE BESY-BENAZEPRIL HCL 5-10 MG PO CAPS Oral Take 1 capsule by mouth daily.    Marland Kitchen DIAZEPAM 10 MG PO TABS Oral Take 10 mg by mouth every 8 (eight) hours as needed. For anxiety     . GABAPENTIN 600 MG PO TABS Oral Take 600 mg by mouth 3 (three) times daily.      . OXYCODONE-ACETAMINOPHEN 7.5-500 MG PO TABS Oral Take 1 tablet by mouth every 8 (eight) hours as needed. For pain     . PRAVASTATIN SODIUM 20 MG PO TABS Oral Take 20 mg by mouth daily.    . VENLAFAXINE HCL ER 150 MG PO CP24 Oral Take 300 mg by mouth daily.      Marland Kitchen VITAMIN D (ERGOCALCIFEROL) 50000 UNITS PO CAPS Oral Take 50,000 Units by mouth every 7 (seven) days. On mondays       BP 119/76  Pulse 76  Temp 98.6 F (37 C) (Oral)  Resp 20  Ht 5\' 2"  (1.575 m)  Wt 147 lb (66.679 kg)  BMI 26.89 kg/m2  SpO2 98%  Physical Exam  Nursing note and vitals reviewed. Constitutional: She is oriented to person, place, and time. She appears well-developed and well-nourished.  HENT:  Head: Normocephalic.  Neck: Neck supple.  Cardiovascular: Normal rate.   Pulmonary/Chest: No respiratory distress. She has decreased breath sounds. She has wheezes.       Wheezing through   Neurological: She is alert and oriented to person, place, and time.  Skin: Skin is warm and dry.  Psychiatric: She has a normal mood and affect. Her behavior is normal. Judgment and thought content normal.    ED Course  Procedures  Dg Chest 2 View  09/01/2012  *RADIOLOGY REPORT*  Clinical Data: Cough.  CHEST - 2 VIEW  Comparison: 12/20/2010  Findings: Heart and mediastinal contours are within normal limits. No focal opacities or effusions.  No acute bony abnormality.  IMPRESSION: No active cardiopulmonary disease.   Original Report Authenticated By: Cyndie Chime, M.D.    Albuterol/atrovent HHN treatment ordered. Care turned over to [facility] @ 5:15 pm to re evaluate the patient and make disposition based  on clinical findings.           [address] [place], [place] 09/02/12 [phone]

## 2012-09-02 NOTE — ED Provider Notes (Signed)
Medical screening examination/treatment/procedure(s) were performed by non-physician practitioner and as supervising physician I was immediately available for consultation/collaboration.   Suzi Roots, MD 09/02/12 1440

## 2012-09-02 NOTE — ED Provider Notes (Signed)
Medical screening examination/treatment/procedure(s) were performed by non-physician practitioner and as supervising physician I was immediately available for consultation/collaboration.  Malka Bocek T Natsuko Kelsay, MD 09/02/12 1458 

## 2012-11-18 ENCOUNTER — Emergency Department (HOSPITAL_COMMUNITY)
Admission: EM | Admit: 2012-11-18 | Discharge: 2012-11-18 | Disposition: A | Discharge status: Home / Self Care | Payer: Medicare Other | Attending: Emergency Medicine | Admitting: Emergency Medicine

## 2012-11-18 ENCOUNTER — Encounter (HOSPITAL_COMMUNITY): Payer: Self-pay

## 2012-11-18 DIAGNOSIS — E785 Hyperlipidemia, unspecified: Secondary | ICD-10-CM | POA: Insufficient documentation

## 2012-11-18 DIAGNOSIS — T63461A Toxic effect of venom of wasps, accidental (unintentional), initial encounter: Secondary | ICD-10-CM | POA: Insufficient documentation

## 2012-11-18 DIAGNOSIS — F17200 Nicotine dependence, unspecified, uncomplicated: Secondary | ICD-10-CM | POA: Insufficient documentation

## 2012-11-18 DIAGNOSIS — W57XXXA Bitten or stung by nonvenomous insect and other nonvenomous arthropods, initial encounter: Secondary | ICD-10-CM

## 2012-11-18 DIAGNOSIS — Y9389 Activity, other specified: Secondary | ICD-10-CM | POA: Insufficient documentation

## 2012-11-18 DIAGNOSIS — I10 Essential (primary) hypertension: Secondary | ICD-10-CM | POA: Insufficient documentation

## 2012-11-18 DIAGNOSIS — Z79899 Other long term (current) drug therapy: Secondary | ICD-10-CM | POA: Insufficient documentation

## 2012-11-18 DIAGNOSIS — Y929 Unspecified place or not applicable: Secondary | ICD-10-CM | POA: Insufficient documentation

## 2012-11-18 DIAGNOSIS — T6391XA Toxic effect of contact with unspecified venomous animal, accidental (unintentional), initial encounter: Secondary | ICD-10-CM | POA: Insufficient documentation

## 2012-11-18 DIAGNOSIS — IMO0002 Reserved for concepts with insufficient information to code with codable children: Secondary | ICD-10-CM | POA: Insufficient documentation

## 2012-11-18 MED ORDER — DOXYCYCLINE HYCLATE 100 MG PO CAPS: 100.0000 mg | ORAL_CAPSULE | Freq: Two times a day (BID) | ORAL | Status: DC
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
## 2012-11-18 NOTE — ED Notes (Signed)
Had tick to upper chest wall 1 week ago, thinks head may still be in wound.

## 2012-11-18 NOTE — ED Provider Notes (Signed)
History/physical exam/procedure(s) were performed by non-physician practitioner and as supervising physician I was immediately available for consultation/collaboration. I have reviewed all notes and am in agreement with care and plan.   Yulanda Diggs S Timiyah Romito, MD 11/18/12 1552 

## 2013-06-30 ENCOUNTER — Encounter (HOSPITAL_COMMUNITY): Payer: Self-pay | Admitting: *Deleted

## 2013-06-30 ENCOUNTER — Emergency Department (HOSPITAL_COMMUNITY)
Admission: EM | Admit: 2013-06-30 | Discharge: 2013-07-01 | Disposition: A | Discharge status: Home / Self Care | Payer: Medicare Other | Attending: Emergency Medicine | Admitting: Emergency Medicine

## 2013-06-30 ENCOUNTER — Emergency Department (HOSPITAL_COMMUNITY): Payer: Medicare Other

## 2013-06-30 DIAGNOSIS — R112 Nausea with vomiting, unspecified: Secondary | ICD-10-CM | POA: Insufficient documentation

## 2013-06-30 DIAGNOSIS — S92009A Unspecified fracture of unspecified calcaneus, initial encounter for closed fracture: Secondary | ICD-10-CM | POA: Insufficient documentation

## 2013-06-30 DIAGNOSIS — S92001A Unspecified fracture of right calcaneus, initial encounter for closed fracture: Secondary | ICD-10-CM

## 2013-06-30 DIAGNOSIS — I10 Essential (primary) hypertension: Secondary | ICD-10-CM | POA: Insufficient documentation

## 2013-06-30 DIAGNOSIS — Z85841 Personal history of malignant neoplasm of brain: Secondary | ICD-10-CM | POA: Insufficient documentation

## 2013-06-30 DIAGNOSIS — Y939 Activity, unspecified: Secondary | ICD-10-CM | POA: Insufficient documentation

## 2013-06-30 DIAGNOSIS — Z8669 Personal history of other diseases of the nervous system and sense organs: Secondary | ICD-10-CM | POA: Insufficient documentation

## 2013-06-30 DIAGNOSIS — Y929 Unspecified place or not applicable: Secondary | ICD-10-CM | POA: Insufficient documentation

## 2013-06-30 DIAGNOSIS — Z79899 Other long term (current) drug therapy: Secondary | ICD-10-CM | POA: Insufficient documentation

## 2013-06-30 DIAGNOSIS — F17200 Nicotine dependence, unspecified, uncomplicated: Secondary | ICD-10-CM | POA: Insufficient documentation

## 2013-06-30 DIAGNOSIS — Z8639 Personal history of other endocrine, nutritional and metabolic disease: Secondary | ICD-10-CM | POA: Insufficient documentation

## 2013-06-30 DIAGNOSIS — Z862 Personal history of diseases of the blood and blood-forming organs and certain disorders involving the immune mechanism: Secondary | ICD-10-CM | POA: Insufficient documentation

## 2013-06-30 DIAGNOSIS — X500XXA Overexertion from strenuous movement or load, initial encounter: Secondary | ICD-10-CM | POA: Insufficient documentation

## 2013-06-30 DIAGNOSIS — Z8739 Personal history of other diseases of the musculoskeletal system and connective tissue: Secondary | ICD-10-CM | POA: Insufficient documentation

## 2013-06-30 HISTORY — DX: Reserved for concepts with insufficient information to code with codable children: IMO0002

## 2013-06-30 MED ORDER — OXYCODONE-ACETAMINOPHEN 5-325 MG PO TABS
ORAL_TABLET | ORAL | Status: AC
  Administered 2013-06-30: 1
  Filled 2013-06-30: qty 1

## 2013-06-30 MED ORDER — PROMETHAZINE HCL 12.5 MG PO TABS
12.5000 mg | ORAL_TABLET | Freq: Once | ORAL | Status: AC
  Administered 2013-06-30: 12.5 mg via ORAL
  Filled 2013-06-30: qty 1

## 2013-06-30 MED ORDER — HYDROMORPHONE HCL PF 1 MG/ML IJ SOLN
1.0000 mg | Freq: Once | INTRAMUSCULAR | Status: AC
  Administered 2013-06-30: 1 mg via INTRAMUSCULAR
  Filled 2013-06-30: qty 1

## 2013-06-30 NOTE — ED Notes (Signed)
Pt slipped and fell down 1 step and twisted her right ankle. Right ankle has plates and screws from a previous injury. Obvious deformity to right leg/ankle.

## 2013-06-30 NOTE — ED Provider Notes (Signed)
History    CSN: [id number] Arrival date & time 06/30/13  2208  First MD Initiated Contact with Patient 06/30/13 2304     Chief Complaint  Patient presents with  . Ankle Injury   (Consider location/radiation/quality/duration/timing/severity/associated sxs/prior Treatment) Patient is a 62 y.o. female presenting with lower extremity injury. The history is provided by the patient.  Ankle Injury This is a new problem. The current episode started today. The problem occurs constantly. The problem has been rapidly worsening. Associated symptoms include nausea and vomiting. Pertinent negatives include no abdominal pain, arthralgias, chest pain, coughing or neck pain. Nothing aggravates the symptoms. She has tried nothing for the symptoms. The treatment provided no relief.   Past Medical History  Diagnosis Date  . Disc degeneration   . Hypertension   . Hyperlipemia   . Brain tumor (benign)   . Pinched nerve in shoulder   . Degenerative disc disease    Past Surgical History  Procedure Laterality Date  . Knee surgery    . Facial fracture surgery    . Ankle fracture surgery    . Tubal ligation     History reviewed. No pertinent family history. History  Substance Use Topics  . Smoking status: Current Every Day Smoker    Types: Cigarettes  . Smokeless tobacco: Not on file  . Alcohol Use: No   OB History   Grav Para Term Preterm Abortions TAB SAB Ect Mult Living                 Review of Systems  Constitutional: Negative for activity change.       All ROS Neg except as noted in HPI  HENT: Negative for nosebleeds and neck pain.   Eyes: Negative for photophobia and discharge.  Respiratory: Negative for cough, shortness of breath and wheezing.   Cardiovascular: Negative for chest pain and palpitations.  Gastrointestinal: Positive for nausea and vomiting. Negative for abdominal pain and blood in stool.  Genitourinary: Negative for dysuria, frequency and hematuria.   Musculoskeletal: Positive for back pain. Negative for arthralgias.  Skin: Negative.   Neurological: Negative for dizziness, seizures and speech difficulty.  Psychiatric/Behavioral: Negative for hallucinations and confusion.    Allergies  Review of patient's allergies indicates no known allergies.  Home Medications   Current Outpatient Rx  Name  Route  Sig  Dispense  Refill  . albuterol (PROVENTIL HFA;VENTOLIN HFA) 108 (90 BASE) MCG/ACT inhaler   Inhalation   Inhale 2 puffs into the lungs every 4 (four) hours as needed for wheezing.   1 Inhaler   0   . alendronate (FOSAMAX) 70 MG tablet   Oral   Take 70 mg by mouth every 7 (seven) days. On mondays. Take with a full glass of water on an empty stomach.          Marland Kitchen alprazolam (XANAX) 2 MG tablet   Oral   Take 2 mg by mouth 3 (three) times daily.         Marland Kitchen amLODipine-benazepril (LOTREL) 5-10 MG per capsule   Oral   Take 1 capsule by mouth daily.         Marland Kitchen gabapentin (NEURONTIN) 600 MG tablet   Oral   Take 600 mg by mouth 3 (three) times daily.           Marland Kitchen oxyCODONE-acetaminophen (PERCOCET) 7.5-500 MG per tablet   Oral   Take 1 tablet by mouth every 8 (eight) hours as needed. For pain          .  pravastatin (PRAVACHOL) 20 MG tablet   Oral   Take 20 mg by mouth daily.         . ranitidine (ZANTAC) 75 MG tablet   Oral   Take 75 mg by mouth daily as needed for heartburn.         . venlafaxine (EFFEXOR-XR) 150 MG 24 hr capsule   Oral   Take 300 mg by mouth daily.           . Vitamin D, Ergocalciferol, (DRISDOL) 50000 UNITS CAPS   Oral   Take 50,000 Units by mouth every 7 (seven) days. On mondays           BP 143/94  Pulse 84  Temp(Src) 98.1 F (36.7 C) (Oral)  Resp 20  Ht 5\' 2"  (1.575 m)  Wt 145 lb (65.772 kg)  BMI 26.51 kg/m2  SpO2 97% Physical Exam  Nursing note and vitals reviewed. Constitutional: She is oriented to person, place, and time. She appears well-developed and well-nourished.   Non-toxic appearance.  HENT:  Head: Normocephalic.  Right Ear: Tympanic membrane and external ear normal.  Left Ear: Tympanic membrane and external ear normal.  Eyes: EOM and lids are normal. Pupils are equal, round, and reactive to light.  Neck: Normal range of motion. Neck supple. Carotid bruit is not present.  Cardiovascular: Normal rate, regular rhythm, normal heart sounds, intact distal pulses and normal pulses.   Pulmonary/Chest: Breath sounds normal. No respiratory distress.  Abdominal: Soft. Bowel sounds are normal. There is no tenderness. There is no guarding.  Musculoskeletal: Normal range of motion.  Good ROM of the hip and knee. Pain to palpation of the right foot. Can not fully assess achilles due to ankle pain. DP pulse 2+.   Lymphadenopathy:       Head (right side): No submandibular adenopathy present.       Head (left side): No submandibular adenopathy present.    She has no cervical adenopathy.  Neurological: She is alert and oriented to person, place, and time. She has normal strength. No cranial nerve deficit or sensory deficit.  Skin: Skin is warm and dry.  Psychiatric: Her speech is normal. Her mood appears anxious.    ED Course  Procedures (including critical care time) Labs Reviewed - No data to display Dg Tibia/fibula Right  06/30/2013   *RADIOLOGY REPORT*  Clinical Data: Ankle injury.  Post fall.  RIGHT TIBIA AND FIBULA - 2 VIEW  Comparison: 12/07/2010 and 01/27/2011  Findings: There are minimal degenerative changes of the right knee. There are chronic changes of the distal tibia and fibula compatible with old fractures with fixation hardware including lateral plate and screws over the mid to distal medial tibia unchanged.  Note that there is fracture of a screw over the distal tibial metaphyseal region unchanged.  There are degenerate changes of the ankle mortise.  There is a displaced fracture of the posterior superior calcaneus.  Remainder of the exam is  unchanged.  IMPRESSION: Displaced posterior superior calcaneal fracture.  Chronic changes as described involving the the distal tibia and fibula compatible with old fractures post fixation of the tibial fracture.   Known fractured distal metaphyseal tibial screw unchanged.   Original Report Authenticated By: Elberta Fortis, M.D.   No diagnosis found.  MDM  I have reviewed nursing notes, vital signs, and all appropriate lab and imaging results for this patient. Patient states she slipped and fell while coming down steps tonight. She injured the right ankle and foot.  X-ray of the tib-fib area reveals a displaced posterior superior calcaneal fracture. There is a chronic change of hardware from a previous operation which involves the fracture of the screw over the distal tibial metaphyseal region that is unchanged. No other fractures appreciated.  These findings were discussed with the patient. These findings were also discussed with Dr. supple with [facility] orthopedics, as Dr. Aldean Baker performed her initial surgery. He suggested the patient have a well-padded splint, be nonweightbearing, and see Dr. Lajoyce Corners in the office.   Kathie Dike, PA-C 07/01/13 [phone]

## 2013-07-01 ENCOUNTER — Encounter (HOSPITAL_COMMUNITY): Payer: Self-pay | Admitting: Nurse Practitioner

## 2013-07-01 ENCOUNTER — Emergency Department (HOSPITAL_COMMUNITY)
Admission: EM | Admit: 2013-07-01 | Discharge: 2013-07-01 | Disposition: A | Discharge status: Home / Self Care | Payer: Medicare Other | Source: Home / Self Care | Attending: Emergency Medicine | Admitting: Emergency Medicine

## 2013-07-01 DIAGNOSIS — Z8739 Personal history of other diseases of the musculoskeletal system and connective tissue: Secondary | ICD-10-CM | POA: Insufficient documentation

## 2013-07-01 DIAGNOSIS — R52 Pain, unspecified: Secondary | ICD-10-CM

## 2013-07-01 DIAGNOSIS — M79609 Pain in unspecified limb: Secondary | ICD-10-CM | POA: Insufficient documentation

## 2013-07-01 DIAGNOSIS — E785 Hyperlipidemia, unspecified: Secondary | ICD-10-CM | POA: Insufficient documentation

## 2013-07-01 DIAGNOSIS — Z79899 Other long term (current) drug therapy: Secondary | ICD-10-CM | POA: Insufficient documentation

## 2013-07-01 DIAGNOSIS — G8911 Acute pain due to trauma: Secondary | ICD-10-CM | POA: Insufficient documentation

## 2013-07-01 DIAGNOSIS — Z8669 Personal history of other diseases of the nervous system and sense organs: Secondary | ICD-10-CM | POA: Insufficient documentation

## 2013-07-01 DIAGNOSIS — M79671 Pain in right foot: Secondary | ICD-10-CM

## 2013-07-01 DIAGNOSIS — S92001S Unspecified fracture of right calcaneus, sequela: Secondary | ICD-10-CM

## 2013-07-01 DIAGNOSIS — I10 Essential (primary) hypertension: Secondary | ICD-10-CM | POA: Insufficient documentation

## 2013-07-01 DIAGNOSIS — F17200 Nicotine dependence, unspecified, uncomplicated: Secondary | ICD-10-CM | POA: Insufficient documentation

## 2013-07-01 MED ORDER — HYDROMORPHONE HCL PF 2 MG/ML IJ SOLN
2.0000 mg | Freq: Once | INTRAMUSCULAR | Status: AC
  Administered 2013-07-01: 2 mg via INTRAMUSCULAR
  Filled 2013-07-01: qty 1

## 2013-07-01 MED ORDER — OXYCODONE-ACETAMINOPHEN 7.5-325 MG PO TABS: 1.0000 | ORAL_TABLET | ORAL | Status: DC | PRN

## 2013-07-01 NOTE — ED Notes (Signed)
Pt assisted to wheelchair & then to restroom. Pt returned to room via wheelchair w/ no complications.

## 2013-07-01 NOTE — ED Notes (Signed)
Pt reports she was treated last night at [facility] for R foot fracture, discharged home with percocet which is not giving her any pain relief.

## 2013-07-01 NOTE — ED Notes (Signed)
Pt alert & oriented x4. Patient given discharge instructions, paperwork & prescription(s). Patient instructed to stop at the registration desk to finish any additional paperwork. Patient verbalized understanding. Pt left department w/ no further questions. 

## 2013-07-01 NOTE — ED Provider Notes (Signed)
History  This chart was scribed for non-physician practitioner working with Carleene Cooper III, MD. This patient was seen in room TR08C/TR08C and the patient's care was started at 3:17 PM.   CSN: [id number]  Arrival date & time 07/01/13  1355   Chief Complaint  Patient presents with  . Leg Pain    The history is provided by the patient. No language interpreter was used.   HPI Comments: [name] is a 62 y.o. female with a hx of DDD and HTN who presents to the Emergency Department complaining of constant, sharp, severe "10/10" right heel pain. Pt states that she was seen in the [facility] ED last night and was treated for a right foot fracture. She was given 7.5 mg Percocet which she has taken every 4 hours without relief of pain. Pt states that she is scheduled to see the Orthopedist tomorrow. She states that she has not taken any Motrin or other anti-inflammatory in addition to her Percocet. She is here today for pain management. She states that she hasn't been able to sleep, and she states that she needs stronger pain management, at least until tomorrow. She denies re-injury to foot, numbness or tingling, cool sensation of the leg, worsening swelling. Pt denies fever, chills or any other symtpoms. She denies alcohol use and is a current every day smoker.  PCP- Dr. Lia Hopping  Past Medical History  Diagnosis Date  . Disc degeneration   . Hypertension   . Hyperlipemia   . Brain tumor (benign)   . Pinched nerve in shoulder   . Degenerative disc disease    Past Surgical History  Procedure Laterality Date  . Knee surgery    . Facial fracture surgery    . Ankle fracture surgery    . Tubal ligation     History reviewed. No pertinent family history. History  Substance Use Topics  . Smoking status: Current Every Day Smoker    Types: Cigarettes  . Smokeless tobacco: Not on file  . Alcohol Use: No   OB History   Grav Para Term Preterm Abortions TAB SAB Ect Mult Living                  Review of Systems  Constitutional: Negative for fever and chills.  Musculoskeletal: Positive for gait problem.       Right heel pain.    Allergies  Review of patient's allergies indicates no known allergies.  Home Medications   Current Outpatient Rx  Name  Route  Sig  Dispense  Refill  . albuterol (PROVENTIL HFA;VENTOLIN HFA) 108 (90 BASE) MCG/ACT inhaler   Inhalation   Inhale 2 puffs into the lungs every 4 (four) hours as needed for wheezing.   1 Inhaler   0   . alendronate (FOSAMAX) 70 MG tablet   Oral   Take 70 mg by mouth every 7 (seven) days. On mondays. Take with a full glass of water on an empty stomach.          Marland Kitchen alprazolam (XANAX) 2 MG tablet   Oral   Take 2 mg by mouth 3 (three) times daily.         Marland Kitchen amLODipine-benazepril (LOTREL) 5-10 MG per capsule   Oral   Take 1 capsule by mouth daily.         Marland Kitchen gabapentin (NEURONTIN) 600 MG tablet   Oral   Take 600 mg by mouth 3 (three) times daily.           Marland Kitchen  oxyCODONE-acetaminophen (PERCOCET) 7.5-325 MG per tablet   Oral   Take 1 tablet by mouth every 4 (four) hours as needed for pain.   30 tablet   0   . oxyCODONE-acetaminophen (PERCOCET) 7.5-500 MG per tablet   Oral   Take 1 tablet by mouth every 8 (eight) hours as needed. For pain          . pravastatin (PRAVACHOL) 20 MG tablet   Oral   Take 20 mg by mouth daily.         . ranitidine (ZANTAC) 75 MG tablet   Oral   Take 75 mg by mouth daily as needed for heartburn.         . venlafaxine (EFFEXOR-XR) 150 MG 24 hr capsule   Oral   Take 300 mg by mouth daily.           . Vitamin D, Ergocalciferol, (DRISDOL) 50000 UNITS CAPS   Oral   Take 50,000 Units by mouth every 7 (seven) days. On mondays           Triage Vitals: BP 129/67  Pulse 78  Temp(Src) 97.9 F (36.6 C) (Oral)  Resp 16  SpO2 95%  Physical Exam  Nursing note and vitals reviewed. Constitutional: She is oriented to person, place, and time. She appears  well-developed and well-nourished. No distress.  HENT:  Head: Normocephalic and atraumatic.  Eyes: Conjunctivae are normal.  Neck: Neck supple.  Cardiovascular: Intact distal pulses.   Pulmonary/Chest: Effort normal.  Musculoskeletal:       Right ankle: Tenderness.  Neurological: She is alert and oriented to person, place, and time.  Skin: Skin is warm and dry. She is not diaphoretic. No erythema. No pallor.  Psychiatric: She has a normal mood and affect.    ED Course  Procedures (including critical care time)  DIAGNOSTIC STUDIES: Oxygen Saturation is 95% on RA, normal by my interpretation.    COORDINATION OF CARE: 3:20 PM- Pt advised of plan to manage her pain with Dilaudid and pt agrees.  Medications  HYDROmorphone (DILAUDID) injection 2 mg (2 mg Intramuscular Given 07/01/13 1532)    Labs Reviewed - No data to display  Dg Tibia/fibula Right  06/30/2013   *RADIOLOGY REPORT*  Clinical Data: Ankle injury.  Post fall.  RIGHT TIBIA AND FIBULA - 2 VIEW  Comparison: 12/07/2010 and 01/27/2011  Findings: There are minimal degenerative changes of the right knee. There are chronic changes of the distal tibia and fibula compatible with old fractures with fixation hardware including lateral plate and screws over the mid to distal medial tibia unchanged.  Note that there is fracture of a screw over the distal tibial metaphyseal region unchanged.  There are degenerate changes of the ankle mortise.  There is a displaced fracture of the posterior superior calcaneus.  Remainder of the exam is unchanged.  IMPRESSION: Displaced posterior superior calcaneal fracture.  Chronic changes as described involving the the distal tibia and fibula compatible with old fractures post fixation of the tibial fracture.   Known fractured distal metaphyseal tibial screw unchanged.   Original Report Authenticated By: Elberta Fortis, M.D.   1. Pain management   2. Heel pain, right   3. Calcaneal fracture, right, sequela      MDM  Pt presenting to the ED for uncontrolled pain for her calcaneal fracture despite using the Percocets prescribed at yesterdays visit. No concern for re or worsening injury. No signs of compartment syndrome from wrap used on PE or from history. Pt  will be treated here and advised to continue to use her Percocet provided at yesterdays visit until she is seen by the orthopedist tomorrow. Advised she would not be sent home with further pain medication. Patient is agreeable to plan. Patient is stable at time of discharge          I personally performed the services described in this documentation, which was scribed in my presence. The recorded information has been reviewed and is accurate.     Jeannetta Ellis, PA-C 07/01/13 1732

## 2013-07-02 NOTE — ED Provider Notes (Signed)
Medical screening examination/treatment/procedure(s) were performed by non-physician practitioner and as supervising physician I was immediately available for consultation/collaboration.   Carleene Cooper III, MD 07/02/13 [phone]

## 2013-07-11 ENCOUNTER — Other Ambulatory Visit (HOSPITAL_COMMUNITY): Payer: Self-pay | Admitting: Orthopedic Surgery

## 2013-07-11 NOTE — ED Provider Notes (Signed)
Medical screening examination/treatment/procedure(s) were performed by non-physician practitioner and as supervising physician I was immediately available for consultation/collaboration.  Nicoletta Dress. Colon Branch, MD 07/11/13 [phone]

## 2013-07-12 ENCOUNTER — Encounter (HOSPITAL_COMMUNITY): Payer: Self-pay

## 2013-07-12 ENCOUNTER — Observation Stay (HOSPITAL_COMMUNITY)
Admission: RE | Admit: 2013-07-12 | Discharge: 2013-07-14 | Disposition: A | Discharge status: Home / Self Care | Payer: Medicare Other | Source: Ambulatory Visit | Attending: Orthopedic Surgery | Admitting: Orthopedic Surgery

## 2013-07-12 ENCOUNTER — Other Ambulatory Visit (HOSPITAL_COMMUNITY): Payer: Self-pay | Admitting: Orthopedic Surgery

## 2013-07-12 ENCOUNTER — Encounter (HOSPITAL_COMMUNITY)
Admission: RE | Admit: 2013-07-12 | Discharge: 2013-07-12 | Disposition: A | Discharge status: Home / Self Care | Payer: Medicare Other | Source: Ambulatory Visit | Attending: Orthopedic Surgery | Admitting: Orthopedic Surgery

## 2013-07-12 DIAGNOSIS — I10 Essential (primary) hypertension: Secondary | ICD-10-CM | POA: Insufficient documentation

## 2013-07-12 DIAGNOSIS — M810 Age-related osteoporosis without current pathological fracture: Secondary | ICD-10-CM | POA: Insufficient documentation

## 2013-07-12 DIAGNOSIS — S92001A Unspecified fracture of right calcaneus, initial encounter for closed fracture: Secondary | ICD-10-CM

## 2013-07-12 DIAGNOSIS — Z01810 Encounter for preprocedural cardiovascular examination: Secondary | ICD-10-CM | POA: Insufficient documentation

## 2013-07-12 DIAGNOSIS — Z01812 Encounter for preprocedural laboratory examination: Secondary | ICD-10-CM | POA: Insufficient documentation

## 2013-07-12 DIAGNOSIS — S92009A Unspecified fracture of unspecified calcaneus, initial encounter for closed fracture: Principal | ICD-10-CM | POA: Insufficient documentation

## 2013-07-12 DIAGNOSIS — W108XXA Fall (on) (from) other stairs and steps, initial encounter: Secondary | ICD-10-CM | POA: Insufficient documentation

## 2013-07-12 DIAGNOSIS — Z79899 Other long term (current) drug therapy: Secondary | ICD-10-CM | POA: Insufficient documentation

## 2013-07-12 DIAGNOSIS — F17200 Nicotine dependence, unspecified, uncomplicated: Secondary | ICD-10-CM | POA: Insufficient documentation

## 2013-07-12 HISTORY — DX: Pneumonia, unspecified organism: J18.9

## 2013-07-12 HISTORY — DX: Anxiety disorder, unspecified: F41.9

## 2013-07-12 HISTORY — DX: Inflammatory liver disease, unspecified: K75.9

## 2013-07-12 HISTORY — DX: Personal history of other diseases of the respiratory system: Z87.09

## 2013-07-12 HISTORY — DX: Major depressive disorder, single episode, unspecified: F32.9

## 2013-07-12 HISTORY — DX: Personal history of colon polyps, unspecified: Z86.0100

## 2013-07-12 HISTORY — DX: Personal history of colonic polyps: Z86.010

## 2013-07-12 HISTORY — DX: Anesthesia of skin: R20.0

## 2013-07-12 HISTORY — DX: Personal history of other infectious and parasitic diseases: Z86.19

## 2013-07-12 HISTORY — DX: Pain in unspecified joint: M25.50

## 2013-07-12 HISTORY — DX: Depression, unspecified: F32.A

## 2013-07-12 HISTORY — DX: Age-related osteoporosis without current pathological fracture: M81.0

## 2013-07-12 HISTORY — DX: Other chronic pain: G89.29

## 2013-07-12 HISTORY — DX: Dorsalgia, unspecified: M54.9

## 2013-07-12 HISTORY — DX: Gastro-esophageal reflux disease without esophagitis: K21.9

## 2013-07-12 LAB — CBC
Hemoglobin: 13.3 g/dL (ref 12.0–15.0)
MCV: 94.4 fL (ref 78.0–100.0)
Platelets: 235 10*3/uL (ref 150–400)
RBC: 4.08 MIL/uL (ref 3.87–5.11)
WBC: 8 10*3/uL (ref 4.0–10.5)

## 2013-07-12 LAB — COMPREHENSIVE METABOLIC PANEL
ALT: 17 U/L (ref 0–35)
AST: 22 U/L (ref 0–37)
CO2: 29 mEq/L (ref 19–32)
Chloride: 100 mEq/L (ref 96–112)
Creatinine, Ser: 0.65 mg/dL (ref 0.50–1.10)
GFR calc non Af Amer: >90 mL/min (ref >90)
Sodium: 139 mEq/L (ref 135–145)
Total Bilirubin: 0.2 mg/dL — ABNORMAL LOW (ref 0.3–1.2)

## 2013-07-12 MED ORDER — CEFAZOLIN SODIUM-DEXTROSE 2-3 GM-% IV SOLR
2.0000 g | INTRAVENOUS | Status: AC
  Administered 2013-07-13: 2 g via INTRAVENOUS
  Filled 2013-07-12: qty 50

## 2013-07-12 NOTE — Progress Notes (Addendum)
Medical Md is Dr.Hasanaj in [place]  Echo report in epic from 2012  CXR in epic from 09-01-12   Pt doesn't have a cardiologist   Denies ever having a heart cath or stress test  Denies EKg in past yr

## 2013-07-12 NOTE — Pre-Procedure Instructions (Signed)
ZOEIE RITTER  07/12/2013   Your procedure is scheduled on:  Fri, July 25 @ 1:15 PM  Report to [facility] at 11:15 AM.  Call this number if you have problems the morning of surgery: [phone]   Remember:   Do not eat food or drink liquids after midnight.   Take these medicines the morning of surgery with A SIP OF WATER: Albuterol<Bring Your Inhaler With You>,Alprazolam(Xanax),Amlodipine-Benazepril(Lotrel),Gabapentin(Neurontin),Omeprazole(Prilosec),Effexor(Venlafaxine),and Pail Pill(if needed)            No Aspirin,Goody's,BC's,Aleve,Ibuprofen,Fish Oil,or any Herbal Medications   Do not wear jewelry, make-up or nail polish.  Do not wear lotions, powders, or perfumes. You may wear deodorant.  Do not shave 48 hours prior to surgery.   Do not bring valuables to the hospital.  [facility] is not responsible                   for any belongings or valuables.  Contacts, dentures or bridgework may not be worn into surgery.  Leave suitcase in the car. After surgery it may be brought to your room.  For patients admitted to the hospital, checkout time is 11:00 AM the day of  discharge.   Patients discharged the day of surgery will not be allowed to drive  home.    Special Instructions: Shower using CHG 2 nights before surgery and the night before surgery.  If you shower the day of surgery use CHG.  Use special wash - you have one bottle of CHG for all showers.  You should use approximately 1/3 of the bottle for each shower.   Please read over the following fact sheets that you were given: Pain Booklet, Coughing and Deep Breathing and Surgical Site Infection Prevention

## 2013-07-13 ENCOUNTER — Inpatient Hospital Stay (HOSPITAL_COMMUNITY): Payer: Medicare Other | Admitting: Anesthesiology

## 2013-07-13 ENCOUNTER — Encounter (HOSPITAL_COMMUNITY)
Admission: RE | Disposition: A | Discharge status: Home / Self Care | Payer: Self-pay | Source: Ambulatory Visit | Attending: Orthopedic Surgery

## 2013-07-13 ENCOUNTER — Encounter (HOSPITAL_COMMUNITY): Payer: Self-pay | Admitting: Anesthesiology

## 2013-07-13 HISTORY — PX: ORIF CALCANEOUS FRACTURE: SHX5030

## 2013-07-13 SURGERY — OPEN REDUCTION INTERNAL FIXATION (ORIF) CALCANEOUS FRACTURE
Anesthesia: General | Site: Foot | Laterality: Right | Wound class: Clean

## 2013-07-13 MED ORDER — HYDROMORPHONE HCL PF 1 MG/ML IJ SOLN: 0.2500 mg | INTRAMUSCULAR | Status: DC | PRN

## 2013-07-13 MED ORDER — ONDANSETRON HCL 4 MG PO TABS: 4.0000 mg | ORAL_TABLET | Freq: Four times a day (QID) | ORAL | Status: DC | PRN

## 2013-07-13 MED ORDER — ROPIVACAINE HCL 5 MG/ML IJ SOLN
INTRAMUSCULAR | Status: DC | PRN
  Administered 2013-07-13: 20 mL

## 2013-07-13 MED ORDER — LACTATED RINGERS IV SOLN
INTRAVENOUS | Status: DC
  Administered 2013-07-13: 12:00:00 via INTRAVENOUS

## 2013-07-13 MED ORDER — PHENYLEPHRINE HCL 10 MG/ML IJ SOLN
10.0000 mg | INTRAVENOUS | Status: DC | PRN
  Administered 2013-07-13: 15 ug/min via INTRAVENOUS

## 2013-07-13 MED ORDER — OXYCODONE HCL 5 MG PO TABS: 5.0000 mg | ORAL_TABLET | Freq: Once | ORAL | Status: DC | PRN

## 2013-07-13 MED ORDER — FENTANYL 50 MCG/HR TD PT72: 50.0000 ug | MEDICATED_PATCH | TRANSDERMAL | Status: DC

## 2013-07-13 MED ORDER — ALPRAZOLAM 0.5 MG PO TABS
2.0000 mg | ORAL_TABLET | Freq: Three times a day (TID) | ORAL | Status: DC | PRN
  Administered 2013-07-13 – 2013-07-14 (×3): 2 mg via ORAL
  Filled 2013-07-13 (×4): qty 4

## 2013-07-13 MED ORDER — OXYCODONE-ACETAMINOPHEN 7.5-325 MG PO TABS: 1.0000 | ORAL_TABLET | ORAL | Status: DC | PRN

## 2013-07-13 MED ORDER — ONDANSETRON HCL 4 MG/2ML IJ SOLN: 4.0000 mg | Freq: Four times a day (QID) | INTRAMUSCULAR | Status: DC | PRN

## 2013-07-13 MED ORDER — VENLAFAXINE HCL ER 150 MG PO CP24
300.0000 mg | ORAL_CAPSULE | Freq: Every day | ORAL | Status: DC
  Administered 2013-07-14: 300 mg via ORAL
  Filled 2013-07-13: qty 2

## 2013-07-13 MED ORDER — MIDAZOLAM HCL 5 MG/5ML IJ SOLN
INTRAMUSCULAR | Status: DC | PRN
  Administered 2013-07-13: 2 mg via INTRAVENOUS

## 2013-07-13 MED ORDER — LIDOCAINE HCL (CARDIAC) 20 MG/ML IV SOLN
INTRAVENOUS | Status: DC | PRN
  Administered 2013-07-13: 50 mg via INTRAVENOUS

## 2013-07-13 MED ORDER — PROPOFOL 10 MG/ML IV BOLUS
INTRAVENOUS | Status: DC | PRN
  Administered 2013-07-13: 160 mg via INTRAVENOUS

## 2013-07-13 MED ORDER — FENTANYL CITRATE 0.05 MG/ML IJ SOLN
INTRAMUSCULAR | Status: AC
  Filled 2013-07-13: qty 2

## 2013-07-13 MED ORDER — SODIUM CHLORIDE 0.9 % IV SOLN
INTRAVENOUS | Status: DC
  Administered 2013-07-13: 18:00:00 via INTRAVENOUS

## 2013-07-13 MED ORDER — PANTOPRAZOLE SODIUM 40 MG PO TBEC
40.0000 mg | DELAYED_RELEASE_TABLET | Freq: Every day | ORAL | Status: DC
  Administered 2013-07-14: 40 mg via ORAL
  Filled 2013-07-13 (×2): qty 1

## 2013-07-13 MED ORDER — CEFAZOLIN SODIUM 1-5 GM-% IV SOLN
1.0000 g | Freq: Four times a day (QID) | INTRAVENOUS | Status: AC
  Administered 2013-07-13 – 2013-07-14 (×3): 1 g via INTRAVENOUS
  Filled 2013-07-13 (×4): qty 50

## 2013-07-13 MED ORDER — ALBUTEROL SULFATE HFA 108 (90 BASE) MCG/ACT IN AERS
2.0000 | INHALATION_SPRAY | RESPIRATORY_TRACT | Status: DC | PRN
  Filled 2013-07-13: qty 6.7

## 2013-07-13 MED ORDER — ASPIRIN EC 325 MG PO TBEC
325.0000 mg | DELAYED_RELEASE_TABLET | Freq: Every day | ORAL | Status: DC
  Administered 2013-07-13 – 2013-07-14 (×2): 325 mg via ORAL
  Filled 2013-07-13 (×2): qty 1

## 2013-07-13 MED ORDER — METOCLOPRAMIDE HCL 10 MG PO TABS: 5.0000 mg | ORAL_TABLET | Freq: Three times a day (TID) | ORAL | Status: DC | PRN

## 2013-07-13 MED ORDER — OXYCODONE-ACETAMINOPHEN 5-325 MG PO TABS
1.0000 | ORAL_TABLET | ORAL | Status: DC | PRN
  Administered 2013-07-13 – 2013-07-14 (×4): 1 via ORAL
  Filled 2013-07-13 (×4): qty 1

## 2013-07-13 MED ORDER — LIDOCAINE HCL 1 % IJ SOLN
INTRAMUSCULAR | Status: DC | PRN
  Administered 2013-07-13: 2 mL via INTRADERMAL

## 2013-07-13 MED ORDER — MIDAZOLAM HCL 2 MG/2ML IJ SOLN
INTRAMUSCULAR | Status: AC
  Administered 2013-07-13: 2 mg
  Filled 2013-07-13: qty 2

## 2013-07-13 MED ORDER — OXYCODONE HCL 5 MG/5ML PO SOLN: 5.0000 mg | Freq: Once | ORAL | Status: DC | PRN

## 2013-07-13 MED ORDER — LACTATED RINGERS IV SOLN
INTRAVENOUS | Status: DC | PRN
  Administered 2013-07-13 (×2): via INTRAVENOUS

## 2013-07-13 MED ORDER — 0.9 % SODIUM CHLORIDE (POUR BTL) OPTIME
TOPICAL | Status: DC | PRN
  Administered 2013-07-13: 1000 mL

## 2013-07-13 MED ORDER — HYDROMORPHONE HCL PF 1 MG/ML IJ SOLN
0.5000 mg | INTRAMUSCULAR | Status: DC | PRN
  Administered 2013-07-13 – 2013-07-14 (×7): 1 mg via INTRAVENOUS
  Filled 2013-07-13 (×7): qty 1

## 2013-07-13 MED ORDER — FENTANYL CITRATE 0.05 MG/ML IJ SOLN
INTRAMUSCULAR | Status: AC
  Administered 2013-07-13: 100 ug
  Filled 2013-07-13: qty 2

## 2013-07-13 MED ORDER — PHENYLEPHRINE HCL 10 MG/ML IJ SOLN
INTRAMUSCULAR | Status: DC | PRN
  Administered 2013-07-13 (×2): 80 ug via INTRAVENOUS

## 2013-07-13 MED ORDER — SIMVASTATIN 5 MG PO TABS
5.0000 mg | ORAL_TABLET | Freq: Every day | ORAL | Status: DC
  Administered 2013-07-13: 5 mg via ORAL
  Filled 2013-07-13 (×2): qty 1

## 2013-07-13 MED ORDER — GABAPENTIN 600 MG PO TABS
600.0000 mg | ORAL_TABLET | Freq: Three times a day (TID) | ORAL | Status: DC
  Administered 2013-07-13 – 2013-07-14 (×2): 600 mg via ORAL
  Filled 2013-07-13 (×5): qty 1

## 2013-07-13 MED ORDER — METOCLOPRAMIDE HCL 5 MG/ML IJ SOLN: 5.0000 mg | Freq: Three times a day (TID) | INTRAMUSCULAR | Status: DC | PRN

## 2013-07-13 MED ORDER — METOCLOPRAMIDE HCL 5 MG/ML IJ SOLN: 10.0000 mg | Freq: Once | INTRAMUSCULAR | Status: DC | PRN

## 2013-07-13 MED ORDER — LIDOCAINE-EPINEPHRINE (PF) 1.5 %-1:200000 IJ SOLN
INTRAMUSCULAR | Status: DC | PRN
  Administered 2013-07-13: 20 mL

## 2013-07-13 SURGICAL SUPPLY — 52 items
ANCH SUT 2 CP-2 ROTR CUF (Anchor) ×3 IMPLANT
ANCHOR ROTATOR CUFF #2 (Anchor) ×3 IMPLANT
BANDAGE ELASTIC 4 VELCRO ST LF (GAUZE/BANDAGES/DRESSINGS) IMPLANT
BANDAGE ELASTIC 6 VELCRO ST LF (GAUZE/BANDAGES/DRESSINGS) IMPLANT
BANDAGE ESMARK 6X9 LF (GAUZE/BANDAGES/DRESSINGS) IMPLANT
BANDAGE GAUZE ELAST BULKY 4 IN (GAUZE/BANDAGES/DRESSINGS) ×1 IMPLANT
BNDG CMPR 9X6 STRL LF SNTH (GAUZE/BANDAGES/DRESSINGS)
BNDG COHESIVE 6X5 TAN STRL LF (GAUZE/BANDAGES/DRESSINGS) ×3 IMPLANT
BNDG ESMARK 6X9 LF (GAUZE/BANDAGES/DRESSINGS)
BNDG GAUZE STRTCH 6 (GAUZE/BANDAGES/DRESSINGS) ×3 IMPLANT
CLOTH BEACON ORANGE TIMEOUT ST (SAFETY) ×2 IMPLANT
COTTON STERILE ROLL (GAUZE/BANDAGES/DRESSINGS) ×1 IMPLANT
COVER SURGICAL LIGHT HANDLE (MISCELLANEOUS) ×2 IMPLANT
CUFF TOURNIQUET SINGLE 34IN LL (TOURNIQUET CUFF) IMPLANT
CUFF TOURNIQUET SINGLE 44IN (TOURNIQUET CUFF) IMPLANT
DRAPE INCISE IOBAN 66X45 STRL (DRAPES) ×2 IMPLANT
DRAPE OEC MINIVIEW 54X84 (DRAPES) ×2 IMPLANT
DRAPE PROXIMA HALF (DRAPES) ×2 IMPLANT
DRAPE U-SHAPE 47X51 STRL (DRAPES) ×2 IMPLANT
DRSG ADAPTIC 3X8 NADH LF (GAUZE/BANDAGES/DRESSINGS) ×2 IMPLANT
DRSG PAD ABDOMINAL 8X10 ST (GAUZE/BANDAGES/DRESSINGS) ×1 IMPLANT
DURAPREP 26ML APPLICATOR (WOUND CARE) ×2 IMPLANT
ELECT REM PT RETURN 9FT ADLT (ELECTROSURGICAL) ×2
ELECTRODE REM PT RTRN 9FT ADLT (ELECTROSURGICAL) ×1 IMPLANT
GLOVE BIOGEL PI IND STRL 9 (GLOVE) ×1 IMPLANT
GLOVE BIOGEL PI INDICATOR 9 (GLOVE) ×1
GLOVE SURG ORTHO 9.0 STRL STRW (GLOVE) ×2 IMPLANT
GOWN PREVENTION PLUS XLARGE (GOWN DISPOSABLE) ×2 IMPLANT
GOWN SRG XL XLNG 56XLVL 4 (GOWN DISPOSABLE) ×2 IMPLANT
GOWN STRL NON-REIN XL XLG LVL4 (GOWN DISPOSABLE) ×2
GUIDEPIN 2.8X300 THRD TIP OIC (Screw) ×3 IMPLANT
KIT BASIN OR (CUSTOM PROCEDURE TRAY) ×2 IMPLANT
KIT ROOM TURNOVER OR (KITS) ×2 IMPLANT
MANIFOLD NEPTUNE II (INSTRUMENTS) ×2 IMPLANT
NS IRRIG 1000ML POUR BTL (IV SOLUTION) ×2 IMPLANT
PACK ORTHO EXTREMITY (CUSTOM PROCEDURE TRAY) ×2 IMPLANT
PAD ARMBOARD 7.5X6 YLW CONV (MISCELLANEOUS) ×4 IMPLANT
PADDING CAST COTTON 6X4 STRL (CAST SUPPLIES) ×3 IMPLANT
SCREW CANN 7.3X35 16MM THRD (Screw) ×1 IMPLANT
SCREW CANN 7.3X40 16MM THRD (Screw) ×2 IMPLANT
SPLINT PLASTER CAST XFAST 5X30 (CAST SUPPLIES) IMPLANT
SPLINT PLASTER XFAST SET 5X30 (CAST SUPPLIES) ×1
SPONGE GAUZE 4X4 12PLY (GAUZE/BANDAGES/DRESSINGS) ×2 IMPLANT
SPONGE LAP 18X18 X RAY DECT (DISPOSABLE) ×2 IMPLANT
STAPLER VISISTAT 35W (STAPLE) IMPLANT
SUCTION FRAZIER TIP 10 FR DISP (SUCTIONS) ×2 IMPLANT
SUT ETHILON 2 0 PSLX (SUTURE) IMPLANT
SUT VIC AB 2-0 CTB1 (SUTURE) ×4 IMPLANT
TOWEL OR 17X24 6PK STRL BLUE (TOWEL DISPOSABLE) ×2 IMPLANT
TOWEL OR 17X26 10 PK STRL BLUE (TOWEL DISPOSABLE) ×2 IMPLANT
TUBE CONNECTING 12X1/4 (SUCTIONS) ×2 IMPLANT
WATER STERILE IRR 1000ML POUR (IV SOLUTION) ×1 IMPLANT

## 2013-07-13 NOTE — Anesthesia Procedure Notes (Addendum)
Anesthesia Regional Block:  Popliteal block  Pre-Anesthetic Checklist: ,, timeout performed, Correct Patient, Correct Site, Correct Laterality, Correct Procedure, Correct Position, site marked, Risks and benefits discussed,  Surgical consent,  Pre-op evaluation,  At surgeon's request and post-op pain management  Laterality: Right  Prep: chloraprep       Needles:   Needle Type: Other     Needle Length: 9cm  Needle Gauge: 21    Additional Needles:  Procedures: ultrasound guided (picture in chart) Popliteal block Narrative:  Start time: 07/13/2013 12:55 PM End time: 07/13/2013 1:05 PM Injection made incrementally with aspirations every 5 mL.  Performed by: Personally  Anesthesiologist: Aldona Lento, MD  Additional Notes: Ultrasound guidance used to: id relevant anatomy, confirm needle position, local anesthetic spread, avoidance of vascular puncture. Picture saved. No complications. Block performed personally by Janetta Hora. Gelene Mink, MD  .    Popliteal block Procedure Name: LMA Insertion Date/Time: 07/13/2013 2:05 PM Performed by: Gwenyth Allegra Pre-anesthesia Checklist: Patient identified, Timeout performed, Emergency Drugs available, Suction available and Patient being monitored Patient Re-evaluated:Patient Re-evaluated prior to inductionOxygen Delivery Method: Circle system utilized Preoxygenation: Pre-oxygenation with 100% oxygen Intubation Type: IV induction LMA: LMA inserted LMA Size: 4.0 Number of attempts: 1 Placement Confirmation: positive ETCO2 and breath sounds checked- equal and bilateral Tube secured with: Tape Dental Injury: Teeth and Oropharynx as per pre-operative assessment

## 2013-07-13 NOTE — Anesthesia Preprocedure Evaluation (Signed)
Anesthesia Evaluation  Patient identified by MRN, date of birth, ID band Patient awake    Reviewed: Allergy & Precautions, H&P , NPO status , Patient's Chart, lab work & pertinent test results, reviewed documented beta blocker date and time   Airway Mallampati: II TM Distance: >3 FB Neck ROM: full    Dental   Pulmonary pneumonia -, resolved, Current Smoker,  breath sounds clear to auscultation        Cardiovascular hypertension, On Medications Rhythm:regular     Neuro/Psych PSYCHIATRIC DISORDERS  Neuromuscular disease negative neurological ROS     GI/Hepatic Neg liver ROS, GERD-  Medicated and Controlled,(+) Hepatitis -  Endo/Other  negative endocrine ROS  Renal/GU negative Renal ROS  negative genitourinary   Musculoskeletal   Abdominal   Peds  Hematology negative hematology ROS (+)   Anesthesia Other Findings See surgeon's H&P   Reproductive/Obstetrics negative OB ROS                           Anesthesia Physical Anesthesia Plan  ASA: II  Anesthesia Plan: General   Post-op Pain Management:    Induction: Intravenous  Airway Management Planned: LMA  Additional Equipment:   Intra-op Plan:   Post-operative Plan:   Informed Consent: I have reviewed the patients History and Physical, chart, labs and discussed the procedure including the risks, benefits and alternatives for the proposed anesthesia with the patient or authorized representative who has indicated his/her understanding and acceptance.   Dental Advisory Given  Plan Discussed with: CRNA and Surgeon  Anesthesia Plan Comments:         Anesthesia Quick Evaluation

## 2013-07-13 NOTE — Op Note (Signed)
OPERATIVE REPORT  DATE OF SURGERY: 07/13/2013  PATIENT:  [name],  62 y.o. female  PRE-OPERATIVE DIAGNOSIS:  Right calcaneus fracture  POST-OPERATIVE DIAGNOSIS:  Right calcaneus fracture  PROCEDURE:  Procedure(s): RIGHT OPEN REDUCTION INTERNAL FIXATION (ORIF) CALCANEUS FRACTURE Partial excision right calcaneus  SURGEON:  Surgeon(s): Nadara Mustard, MD   ANESTHESIA:   general  EBL:  min ML  SPECIMEN:  No Specimen  TOURNIQUET:  * No tourniquets in log *  PROCEDURE DETAILS: Patient is a 62 year old woman who presents with a tongue-type right calcaneal fracture. This has been going on long enough that she is developed ischemic changes of the posterior aspect of the Achilles. Patient presents at this time urgently for open reduction internal fixation of the tongue-type fracture. Risks and benefits were discussed including infection necrosis of the skin nonhealing of the wound need for additional surgery. Patient states she understands and wished to proceed at this time. Description of procedure patient brought to the operating room and underwent general anesthetic. After adequate levels and anesthesia were obtained patient was placed in the left lateral decubitus position with the right side up and the right lower extremity was prepped using DuraPrep and draped into a sterile field. One stab incision was made posteriorly to the lateral aspect of the Achilles. Using aching: Clamp this was reduced then 2 percutaneous holes were made for a K wire to stabilize the fixation. 37.3 mm screws were then used to stabilize the tongue-type fracture. Do to severe osteoporosis he has screw heads were not strong enough to hold down the fragment. The screws were then removed an incision was made approximately 3 cm in length partial calcaneal excision was performed and the remainder of the calcaneus plus the Achilles tendon was then advanced to the bone and stabilized with 3 Mytec anchors. C-arm  fluoroscopy verified reduction. The wound was irrigated with normal saline the incision was closed using Denati Algower suture technique with 2-0 nylon. The wound was covered with Adaptic orthopedic sponges AB dressing Kerlix level and a posterior and sugar tong splint was applied patient was extubated taken to the PACU is stable condition plan for overnight observation.  PLAN OF CARE: Admit for overnight observation  PATIENT DISPOSITION:  PACU - hemodynamically stable.   Nadara Mustard, MD 07/13/2013 3:31 PM

## 2013-07-13 NOTE — Anesthesia Postprocedure Evaluation (Signed)
  Anesthesia Post-op Note  Patient: [name]  Procedure(s) Performed: Procedure(s): RIGHT OPEN REDUCTION INTERNAL FIXATION (ORIF) CALCANEUS FRACTURE (Right)  Patient Location: PACU  Anesthesia Type:GA combined with regional for post-op pain  Level of Consciousness: awake  Airway and Oxygen Therapy: Patient Spontanous Breathing  Post-op Pain: none  Post-op Assessment: Post-op Vital signs reviewed  Post-op Vital Signs: stable  Complications: No apparent anesthesia complications

## 2013-07-13 NOTE — Preoperative (Signed)
Beta Blockers   Reason not to administer Beta Blockers:Not Applicable 

## 2013-07-13 NOTE — Progress Notes (Signed)
Plan for discharge to home Saturday. Nonweightbearing right foot. Patient has pain medication at home.

## 2013-07-13 NOTE — Transfer of Care (Signed)
Immediate Anesthesia Transfer of Care Note  Patient: [name]  Procedure(s) Performed: Procedure(s): RIGHT OPEN REDUCTION INTERNAL FIXATION (ORIF) CALCANEUS FRACTURE (Right)  Patient Location: PACU  Anesthesia Type:GA combined with regional for post-op pain  Level of Consciousness: awake, alert  and oriented  Airway & Oxygen Therapy: Patient Spontanous Breathing and Patient connected to nasal cannula oxygen  Post-op Assessment: Report given to PACU RN and Post -op Vital signs reviewed and stable  Post vital signs: Reviewed and stable  Complications: No apparent anesthesia complications

## 2013-07-13 NOTE — H&P (Signed)
[name] is an 62 y.o. female.   Chief Complaint:  Tong-type right calcaneal fracture HPI: Patient is a 62 year old woman who sustained a displaced calcaneus fracture. She was initially seen in emergency room was curetted and then presented to the office. Patient has ischemic changes over the fracture site and is at risk of skin breakdown she presents at this time for urgent intervention.  Past Medical History  Diagnosis Date  . Disc degeneration   . Hyperlipemia     takes Pravastatin daily  . Brain tumor (benign)   . Pinched nerve in shoulder   . Degenerative disc disease   . Hypertension     takes Lotrel daily  . Depression     takes Effexor daily  . GERD (gastroesophageal reflux disease)     takes Omeprazole daily  . Anxiety     takes Xanax daily  . History of bronchitis     uses inhaler daily  . Pneumonia 2012  . Numbness in both hands   . Joint pain   . Chronic back pain     bulding disc  . Osteoporosis     Fosamax weekly  . Hepatitis     Hep C  . History of colon polyps   . History of staph infection     Past Surgical History  Procedure Laterality Date  . Knee surgery Right   . Facial fracture surgery      in the late 90's  . Ankle fracture surgery Bilateral early 2000's  . Tubal ligation    . Colonoscopy      No family history on file. Social History:  reports that she has been smoking Cigarettes.  She has a 20 pack-year smoking history. She does not have any smokeless tobacco history on file. She reports that she does not drink alcohol or use illicit drugs.  Allergies: No Known Allergies  No prescriptions prior to admission    Results for orders placed during the hospital encounter of 07/12/13 (from the past 48 hour(s))  CBC     Status: None   Collection Time    07/12/13  3:41 PM      Result Value Range   WBC 8.0  4.0 - 10.5 K/uL   RBC 4.08  3.87 - 5.11 MIL/uL   Hemoglobin 13.3  12.0 - 15.0 g/dL   HCT 30.8  65.7 - 84.6 %   MCV 94.4  78.0  - 100.0 fL   MCH 32.6  26.0 - 34.0 pg   MCHC 34.5  30.0 - 36.0 g/dL   RDW 96.2  95.2 - 84.1 %   Platelets 235  150 - 400 K/uL  COMPREHENSIVE METABOLIC PANEL     Status: Abnormal   Collection Time    07/12/13  3:41 PM      Result Value Range   Sodium 139  135 - 145 mEq/L   Potassium 4.7  3.5 - 5.1 mEq/L   Chloride 100  96 - 112 mEq/L   CO2 29  19 - 32 mEq/L   Glucose, Bld 94  70 - 99 mg/dL   BUN 10  6 - 23 mg/dL   Creatinine, Ser 3.24  0.50 - 1.10 mg/dL   Calcium 9.8  8.4 - 40.1 mg/dL   Total Protein 6.9  6.0 - 8.3 g/dL   Albumin 3.9  3.5 - 5.2 g/dL   AST 22  0 - 37 U/L   ALT 17  0 - 35 U/L   Alkaline  Phosphatase 44  39 - 117 U/L   Total Bilirubin 0.2 (*) 0.3 - 1.2 mg/dL   GFR calc non Af Amer >90  >90 mL/min   GFR calc Af Amer >90  >90 mL/min   Comment:            The eGFR has been calculated     using the CKD EPI equation.     This calculation has not been     validated in all clinical     situations.     eGFR's persistently     <90 mL/min signify     possible Chronic Kidney Disease.   No results found.  Review of Systems  All other systems reviewed and are negative.    There were no vitals taken for this visit. Physical Exam  On examination patient has palpable pulses. There is some ischemic changes over the  displaced calcaneus fracture posteriorly. Radiograph shows worsening of the displacement of the calcaneal fracture. Assessment/Plan Assessment: Displaced tongue-type calcaneal fracture right foot with ischemic skin changes posteriorly.  Plan: Patient presents for open reduction percutaneous screw fixation for the calcaneal fracture. Discussed the patient is at risk of skin breakdown with risk of complications do to the skin ischemic changes. Risks of infection neurovascular injury pain nonhealing of the wound necrosis of the skin need for additional surgery. Patient states she understands and wished to proceed at this time.  DUDA,MARCUS V 07/13/2013, 6:39  AM

## 2013-07-14 MED ORDER — FENTANYL 50 MCG/HR TD PT72: 50.0000 ug | MEDICATED_PATCH | TRANSDERMAL | Status: DC

## 2013-07-14 MED ORDER — OXYCODONE HCL ER 20 MG PO T12A: 20.0000 mg | EXTENDED_RELEASE_TABLET | Freq: Two times a day (BID) | ORAL | Status: DC

## 2013-07-14 MED ORDER — OXYCODONE HCL 10 MG PO TABS: 10.0000 mg | ORAL_TABLET | ORAL | Status: DC | PRN

## 2013-07-14 MED ORDER — OXYCODONE HCL 5 MG PO TABS
10.0000 mg | ORAL_TABLET | ORAL | Status: DC | PRN
  Administered 2013-07-14: 10 mg via ORAL
  Filled 2013-07-14: qty 2

## 2013-07-14 MED ORDER — FENTANYL 50 MCG/HR TD PT72
50.0000 ug | MEDICATED_PATCH | TRANSDERMAL | Status: DC
  Administered 2013-07-14: 50 ug via TRANSDERMAL
  Filled 2013-07-14: qty 1

## 2013-07-14 NOTE — Progress Notes (Signed)
Patient ID: [name], female   DOB: 1951-01-30, 62 y.o.   MRN: [id number] Subjective: 1 Day Post-Op Procedure(s) (LRB): RIGHT OPEN REDUCTION INTERNAL FIXATION (ORIF) CALCANEUS FRACTURE (Right) Awake, alert and Oriented x 4. Has been on chronic pain medications through her primary Care and desires a fentanyl patch and percocet 7.5mg  prescriptions to control her pain. She has been on these previously. Patient reports pain as marked.    Objective:   VITALS:  Temp:  [97.6 F (36.4 C)-98.6 F (37 C)] 98.3 F (36.8 C) (07/26 0542) Pulse Rate:  [41-118] 77 (07/26 0542) Resp:  [10-20] 16 (07/26 0542) BP: (100-125)/(22-75) 115/22 mmHg (07/26 0542) SpO2:  [82 %-100 %] 93 % (07/26 0542)  Neurologically intact ABD soft Neurovascular intact Sensation intact distally Intact pulses distally Dorsiflexion/Plantar flexion intact Incision: dressing C/D/I Compartment soft   LABS  Recent Labs  07/12/13 1541  HGB 13.3  WBC 8.0  PLT 235    Recent Labs  07/12/13 1541  NA 139  K 4.7  CL 100  CO2 29  BUN 10  CREATININE 0.65  GLUCOSE 94   No results found for this basename: LABPT, INR,  in the last 72 hours   Assessment/Plan: 1 Day Post-Op Procedure(s) (LRB): RIGHT OPEN REDUCTION INTERNAL FIXATION (ORIF) CALCANEUS FRACTURE (Right) Chronic narcotic medication use with opiate tolerance.  Advance diet Up with therapy Discharge home with home health Will prescribe Oxycontin 20mg  tablets and Percocet 7.5mg  for breakthrough pain. She will need to discuss the continued use of these medications with Dr. Lajoyce Corners and her primary care physician. I advised her  Not to mix fentanyl with these meds as they can potentially cause acute respiratory depression and death. She understands.  NITKA,JAMES E 07/14/2013, 12:42 PM

## 2013-07-14 NOTE — Evaluation (Signed)
Physical Therapy Evaluation Patient Details Name: [name] MRN: [id number] DOB: [birth year] Today's Date: 07/14/2013 Time: 4098-1191 PT Time Calculation (min): 32 min  PT Assessment / Plan / Recommendation History of Present Illness  Pt s/p ORIF to R calcaneal fx.  Clinical Impression  Pt with increased R foot pain however tolerating mobility well. Pt with good home set up and support. Pt was using crutches at home however presents with increased stability and independence with RW. Pt to benefit from tub bench for safe shower transfers due to R LE NWB however if can not obtain she will need a shower chair as she is unable to stand safely in the shower at this time. Pt deferred renting a w/c for longer distances at this time. Pt safe to d/c home with assist of family and recommended DME. Pt also concerned regarding pain medication due to her pharmacy being closed on sat/sunday. Reported concerns to RN.    PT Assessment  Patient needs continued PT services    Follow Up Recommendations  No PT follow up;Supervision - Intermittent (will need outpt PT once cleared by MD)    Does the patient have the potential to tolerate intense rehabilitation      Barriers to Discharge        Equipment Recommendations  Rolling walker with 5" wheels (tub bench or shower chair)    Recommendations for Other Services     Frequency Min 5X/week    Precautions / Restrictions Precautions Precautions: Fall Required Braces or Orthoses:  (pt in R LE cast) Restrictions Weight Bearing Restrictions: Yes RLE Weight Bearing: Non weight bearing   Pertinent Vitals/Pain 8/10 R LE      Mobility  Bed Mobility Bed Mobility: Supine to Sit Supine to Sit: 6: Modified independent (Device/Increase time) Transfers Transfers: Sit to Stand;Stand to Sit Sit to Stand: 5: Supervision Stand to Sit: 5: Supervision Details for Transfer Assistance: pt with good technique, good hand  placement Ambulation/Gait Ambulation/Gait Assistance: 5: Supervision Ambulation Distance (Feet): 40 Feet Assistive device: Rolling walker Ambulation/Gait Assistance Details: no episodes of LOB Gait Pattern: Step-to pattern Gait velocity: wfl for surgery Stairs: No    Exercises     PT Diagnosis: Difficulty walking;Acute pain  PT Problem List: Decreased activity tolerance;Decreased mobility PT Treatment Interventions: DME instruction;Gait training;Functional mobility training;Therapeutic activities;Therapeutic exercise     PT Goals(Current goals can be found in the care plan section) Acute Rehab PT Goals Patient Stated Goal: pain under control PT Goal Formulation: With patient Time For Goal Achievement: 07/21/13 Potential to Achieve Goals: Good  Visit Information  Last PT Received On: 07/14/13 Assistance Needed: +1 History of Present Illness: Pt s/p ORIF to R calcaneal fx.       Prior Functioning  Home Living Family/patient expects to be discharged to:: Private residence Living Arrangements: Spouse/significant other Available Help at Discharge: Family;Available 24 hours/day Type of Home: House Home Access: Level entry Home Layout: One level Home Equipment: Crutches Prior Function Level of Independence: Independent (has used crutches for last week since fall) Comments: pt has ex-husband and grand-daughter avail 24/7 Communication Communication: No difficulties Dominant Hand: Right    Cognition  Cognition Arousal/Alertness: Awake/alert Behavior During Therapy: WFL for tasks assessed/performed Overall Cognitive Status: Within Functional Limits for tasks assessed    Extremity/Trunk Assessment Upper Extremity Assessment Upper Extremity Assessment: Overall WFL for tasks assessed Lower Extremity Assessment Lower Extremity Assessment: RLE deficits/detail RLE Deficits / Details: ankle in cast but able to wiggle toes, hip/kneeWFL Cervical / Trunk Assessment  Cervical /  Trunk Assessment: Normal   Balance    End of Session PT - End of Session Equipment Utilized During Treatment: Gait belt Activity Tolerance: Patient limited by pain Patient left: in chair;with call bell/phone within reach (LE elevated) Nurse Communication: Mobility status (need for DME for d/c)  GP Functional Assessment Tool Used: clinical judgment Functional Limitation: Mobility: Walking and moving around Mobility: Walking and Moving Around Current Status (Z6109): At least 40 percent but less than 60 percent impaired, limited or restricted Mobility: Walking and Moving Around Goal Status [phone]): At least 20 percent but less than 40 percent impaired, limited or restricted   Marcene Brawn 07/14/2013, 8:42 AM  Lewis Shock, PT, DPT Pager #: [phone] Office #: [phone]

## 2013-07-14 NOTE — Progress Notes (Signed)
   CARE MANAGEMENT NOTE 07/14/2013  Patient:  [name], [name]   Account Number:  [id number]  Date Initiated:  07/14/2013  Documentation initiated by:  [facility]  Subjective/Objective Assessment:     Action/Plan:   Anticipated DC Date:  07/14/2013   Anticipated DC Plan:  HOME W HOME HEALTH SERVICES      DC Planning Services  CM consult      [facility] Choice  HOME HEALTH   Choice offered to / List presented to:  C-1 Patient   DME arranged  WALKER - ROLLING  TUB BENCH      DME agency  [facility]     HH arranged  HH-2 PT  HH-1 RN  HH-3 OT      HH agency  [facility]   Status of service:  Completed, signed off Medicare Important Message given?   (If response is "NO", the following Medicare IM given date fields will be blank) Date Medicare IM given:   Date Additional Medicare IM given:    Discharge Disposition:  HOME W HOME HEALTH SERVICES  Per UR Regulation:    If discussed at Long Length of Stay Meetings, dates discussed:    Comments:  07/14/2013 1500 Notified [facility] of pts dc home today. Isidoro Donning RN CCM Case Mgmt phone [phone]

## 2013-07-16 NOTE — Discharge Summary (Signed)
Physician Discharge Summary  Patient ID: [name] MRN: [id number] DOB/AGE: 04/02/1951 62 y.o.  Admit date: 07/13/2013 Discharge date: 07/16/2013  Admission Diagnoses: Right calcaneous fracture with a skin ulceration  Discharge Diagnoses: Right calcaneous fracture with skin ulceration Active Problems:   * No active hospital problems. *   Discharged Condition: stable  Hospital Course: Patient's hospital course was essentially unremarkable. She underwent partial excision of the calcaneus with reconstruction of the calcaneus fracture and Achilles tendon. Postoperatively patient progressed well and was discharged to home in stable condition.  Consults: None  Significant Diagnostic Studies: labs: Routine labs  Treatments: surgery: See operative note  Discharge Exam: Blood pressure 115/22, pulse 77, temperature 98.3 F (36.8 C), temperature source Oral, resp. rate 16, SpO2 93.00%. Incision/Wound: dressing clean dry and intact  Disposition: 01-Home or Self Care  Discharge Orders   Future Orders Complete By Expires     Call MD / Call 911  As directed     Comments:      If you experience chest pain or shortness of breath, CALL 911 and be transported to the hospital emergency room.  If you develope a fever above 101 F, pus (white drainage) or increased drainage or redness at the wound, or calf pain, call your surgeon's office.    Constipation Prevention  As directed     Comments:      Drink plenty of fluids.  Prune juice may be helpful.  You may use a stool softener, such as Colace (over the counter) 100 mg twice a day.  Use MiraLax (over the counter) for constipation as needed.    Diet - low sodium heart healthy  As directed     Discharge instructions  As directed     Comments:      Keep short leg splint and dressing dry. May use water impervious bag or cast bag and tub chair to shower Tape the top of bag to skin to avoid moisture soaking the dressing on the leg. Call if  there is odor or saturation of dressing or worsening pain not controlled with medications. Call if fever greater than 101.5. Use crutches or walker no weight bearing on the ankle fracture leg. Please follow up with an appointment with Dr. Otelia Sergeant  2 weeks from the time of surgery. Elevate as often as possible during the first week after surgery gradually increasing the time the leg is dependent or down there after. If swelling recurrs then elevate again. Wheel chair for longer distances. Take anticoagulant daily aspirin 325mg  2 times daily with meal.    Driving restrictions  As directed     Comments:      No driving for 6 weeks    Increase activity slowly as tolerated  As directed     Lifting restrictions  As directed     Comments:      No lifting for 6 weeks        Medication List    STOP taking these medications       fentaNYL 50 MCG/HR  Commonly known as:  DURAGESIC - dosed mcg/hr     oxyCODONE-acetaminophen 7.5-325 MG per tablet  Commonly known as:  PERCOCET      TAKE these medications       albuterol 108 (90 BASE) MCG/ACT inhaler  Commonly known as:  PROVENTIL HFA;VENTOLIN HFA  Inhale 2 puffs into the lungs every 4 (four) hours as needed for wheezing.     alendronate 70 MG tablet  Commonly known  as:  FOSAMAX  Take 70 mg by mouth every 7 (seven) days. On mondays. Take with a full glass of water on an empty stomach.     alprazolam 2 MG tablet  Commonly known as:  XANAX  Take 2 mg by mouth 3 (three) times daily.     amLODipine-benazepril 5-10 MG per capsule  Commonly known as:  LOTREL  Take 1 capsule by mouth daily.     aspirin-acetaminophen-caffeine 250-250-65 MG per tablet  Commonly known as:  EXCEDRIN MIGRAINE  Take 1 tablet by mouth every 6 (six) hours as needed for pain.     gabapentin 600 MG tablet  Commonly known as:  NEURONTIN  Take 600 mg by mouth 3 (three) times daily.     GREEN TEA PO  Take 1 tablet by mouth daily.     Oxycodone HCl 10 MG Tabs   Take 1 tablet (10 mg total) by mouth every 4 (four) hours as needed for pain (use for breakthrough pain.).     OxyCODONE 20 mg T12a  Commonly known as:  OXYCONTIN  Take 1 tablet (20 mg total) by mouth every 12 (twelve) hours.     pravastatin 20 MG tablet  Commonly known as:  PRAVACHOL  Take 20 mg by mouth daily.     PRILOSEC PO  Take 1 tablet by mouth daily.     venlafaxine XR 150 MG 24 hr capsule  Commonly known as:  EFFEXOR-XR  Take 300 mg by mouth daily.     Vitamin D (Ergocalciferol) 50000 UNITS Caps  Commonly known as:  DRISDOL  Take 50,000 Units by mouth every 7 (seven) days. On mondays           Follow-up Information   Follow up with DUDA,MARCUS V, MD In 2 weeks.   Contact information:   [address] [place] [address] [phone]       Follow up with [facility] . (Home Health Physical Therapy, RN and Occupational Therapy)    Contact information:   [phone]      Signed: Nadara Mustard 07/16/2013, 6:23 AM

## 2013-07-17 ENCOUNTER — Encounter (HOSPITAL_COMMUNITY): Payer: Self-pay | Admitting: Orthopedic Surgery

## 2013-09-26 ENCOUNTER — Emergency Department (HOSPITAL_COMMUNITY)
Admission: EM | Admit: 2013-09-26 | Discharge: 2013-09-26 | Disposition: A | Discharge status: Home / Self Care | Payer: Medicare Other | Attending: Emergency Medicine | Admitting: Emergency Medicine

## 2013-09-26 ENCOUNTER — Encounter (HOSPITAL_COMMUNITY): Payer: Self-pay | Admitting: Emergency Medicine

## 2013-09-26 DIAGNOSIS — Z8619 Personal history of other infectious and parasitic diseases: Secondary | ICD-10-CM | POA: Insufficient documentation

## 2013-09-26 DIAGNOSIS — Z8669 Personal history of other diseases of the nervous system and sense organs: Secondary | ICD-10-CM | POA: Insufficient documentation

## 2013-09-26 DIAGNOSIS — G8929 Other chronic pain: Secondary | ICD-10-CM

## 2013-09-26 DIAGNOSIS — I10 Essential (primary) hypertension: Secondary | ICD-10-CM | POA: Insufficient documentation

## 2013-09-26 DIAGNOSIS — F17200 Nicotine dependence, unspecified, uncomplicated: Secondary | ICD-10-CM | POA: Insufficient documentation

## 2013-09-26 DIAGNOSIS — Z8701 Personal history of pneumonia (recurrent): Secondary | ICD-10-CM | POA: Insufficient documentation

## 2013-09-26 DIAGNOSIS — M545 Low back pain, unspecified: Secondary | ICD-10-CM | POA: Insufficient documentation

## 2013-09-26 DIAGNOSIS — F3289 Other specified depressive episodes: Secondary | ICD-10-CM | POA: Insufficient documentation

## 2013-09-26 DIAGNOSIS — F329 Major depressive disorder, single episode, unspecified: Secondary | ICD-10-CM | POA: Insufficient documentation

## 2013-09-26 DIAGNOSIS — Z8739 Personal history of other diseases of the musculoskeletal system and connective tissue: Secondary | ICD-10-CM | POA: Insufficient documentation

## 2013-09-26 DIAGNOSIS — E785 Hyperlipidemia, unspecified: Secondary | ICD-10-CM | POA: Insufficient documentation

## 2013-09-26 DIAGNOSIS — Z79899 Other long term (current) drug therapy: Secondary | ICD-10-CM | POA: Insufficient documentation

## 2013-09-26 DIAGNOSIS — Z860100 Personal history of colon polyps, unspecified: Secondary | ICD-10-CM | POA: Insufficient documentation

## 2013-09-26 DIAGNOSIS — F411 Generalized anxiety disorder: Secondary | ICD-10-CM | POA: Insufficient documentation

## 2013-09-26 DIAGNOSIS — Z8709 Personal history of other diseases of the respiratory system: Secondary | ICD-10-CM | POA: Insufficient documentation

## 2013-09-26 DIAGNOSIS — K219 Gastro-esophageal reflux disease without esophagitis: Secondary | ICD-10-CM | POA: Insufficient documentation

## 2013-09-26 DIAGNOSIS — M79609 Pain in unspecified limb: Secondary | ICD-10-CM | POA: Insufficient documentation

## 2013-09-26 MED ORDER — DEXAMETHASONE 6 MG PO TABS: ORAL_TABLET | ORAL | Status: DC

## 2013-09-26 MED ORDER — KETOROLAC TROMETHAMINE 10 MG PO TABS
10.0000 mg | ORAL_TABLET | Freq: Once | ORAL | Status: AC
  Administered 2013-09-26: 10 mg via ORAL
  Filled 2013-09-26: qty 1

## 2013-09-26 MED ORDER — PREDNISONE 50 MG PO TABS
60.0000 mg | ORAL_TABLET | Freq: Once | ORAL | Status: AC
  Administered 2013-09-26: 60 mg via ORAL
  Filled 2013-09-26 (×2): qty 1

## 2013-09-26 MED ORDER — METHOCARBAMOL 500 MG PO TABS
1000.0000 mg | ORAL_TABLET | Freq: Once | ORAL | Status: AC
  Administered 2013-09-26: 1000 mg via ORAL
  Filled 2013-09-26: qty 2

## 2013-09-26 MED ORDER — DICLOFENAC SODIUM 75 MG PO TBEC: 75.0000 mg | DELAYED_RELEASE_TABLET | Freq: Two times a day (BID) | ORAL | Status: DC

## 2013-09-26 MED ORDER — BACLOFEN 10 MG PO TABS: 10.0000 mg | ORAL_TABLET | Freq: Three times a day (TID) | ORAL | Status: AC

## 2013-09-26 NOTE — ED Notes (Signed)
Pt reports pain in mid to lower back and rt leg, pt states pain from previous injury, denies current injury.

## 2013-09-26 NOTE — ED Provider Notes (Signed)
CSN: [id number]     Arrival date & time 09/26/13  1443 History   None    Chief Complaint  Patient presents with  . Back Pain  . Leg Pain    rt   (Consider location/radiation/quality/duration/timing/severity/associated sxs/prior Treatment) Patient is a 62 y.o. female presenting with back pain and leg pain. The history is provided by the patient.  Back Pain Location:  Lumbar spine Quality:  Aching Radiates to:  R thigh Pain severity:  Moderate Pain is:  Same all the time Duration: Pain from time to time for several months, worse in the past 3 days. Timing:  Intermittent Progression:  Worsening Chronicity:  Chronic Relieved by:  Nothing Worsened by:  Movement, standing and twisting Ineffective treatments:  None tried Associated symptoms: leg pain   Risk factors comment:  Previous injury Leg Pain Associated symptoms: back pain     Past Medical History  Diagnosis Date  . Disc degeneration   . Hyperlipemia     takes Pravastatin daily  . Brain tumor (benign)   . Pinched nerve in shoulder   . Degenerative disc disease   . Hypertension     takes Lotrel daily  . Depression     takes Effexor daily  . GERD (gastroesophageal reflux disease)     takes Omeprazole daily  . Anxiety     takes Xanax daily  . History of bronchitis     uses inhaler daily  . Pneumonia 2012  . Numbness in both hands   . Joint pain   . Chronic back pain     bulding disc  . Osteoporosis     Fosamax weekly  . Hepatitis     Hep C  . History of colon polyps   . History of staph infection    Past Surgical History  Procedure Laterality Date  . Knee surgery Right   . Facial fracture surgery      in the late 90's  . Ankle fracture surgery Bilateral early 2000's  . Tubal ligation    . Colonoscopy    . Orif calcaneous fracture Right 07/13/2013    Procedure: RIGHT OPEN REDUCTION INTERNAL FIXATION (ORIF) CALCANEUS FRACTURE;  Surgeon: Nadara Mustard, MD;  Location: [facility] OR;  Service: Orthopedics;   Laterality: Right;   History reviewed. No pertinent family history. History  Substance Use Topics  . Smoking status: Current Every Day Smoker -- 0.50 packs/day for 40 years    Types: Cigarettes  . Smokeless tobacco: Not on file  . Alcohol Use: No   OB History   Grav Para Term Preterm Abortions TAB SAB Ect Mult Living                 Review of Systems  Musculoskeletal: Positive for arthralgias and back pain.  Psychiatric/Behavioral: The patient is nervous/anxious.        Depression    Allergies  Review of patient's allergies indicates no known allergies.  Home Medications   Current Outpatient Rx  Name  Route  Sig  Dispense  Refill  . albuterol (PROVENTIL HFA;VENTOLIN HFA) 108 (90 BASE) MCG/ACT inhaler   Inhalation   Inhale 2 puffs into the lungs every 6 (six) hours as needed for wheezing.         Marland Kitchen alendronate (FOSAMAX) 70 MG tablet   Oral   Take 70 mg by mouth every 7 (seven) days. On mondays. Take with a full glass of water on an empty stomach.          Marland Kitchen  ALPRAZolam (XANAX) 1 MG tablet   Oral   Take 1 mg by mouth 3 (three) times daily.         Marland Kitchen amLODipine-benazepril (LOTREL) 5-10 MG per capsule   Oral   Take 1 capsule by mouth daily.         Marland Kitchen aspirin-acetaminophen-caffeine (EXCEDRIN MIGRAINE) 250-250-65 MG per tablet   Oral   Take 1 tablet by mouth every 6 (six) hours as needed for pain.         . cholecalciferol (VITAMIN D) 1000 UNITS tablet   Oral   Take 1,000 Units by mouth daily.         Marland Kitchen gabapentin (NEURONTIN) 600 MG tablet   Oral   Take 600 mg by mouth 3 (three) times daily.           Chilton Si Tea, Camillia sinensis, (GREEN TEA PO)   Oral   Take 1 tablet by mouth daily.         . Omeprazole (PRILOSEC PO)   Oral   Take 1 tablet by mouth daily.         . pravastatin (PRAVACHOL) 20 MG tablet   Oral   Take 20 mg by mouth daily.         Marland Kitchen venlafaxine (EFFEXOR-XR) 150 MG 24 hr capsule   Oral   Take 300 mg by mouth daily.             BP 159/92  Pulse 98  Temp(Src) 97.8 F (36.6 C) (Oral)  Resp 18  SpO2 95% Physical Exam  Nursing note and vitals reviewed. Constitutional: She is oriented to person, place, and time. She appears well-developed and well-nourished.  Non-toxic appearance.  HENT:  Head: Normocephalic.  Right Ear: Tympanic membrane and external ear normal.  Left Ear: Tympanic membrane and external ear normal.  Eyes: EOM and lids are normal. Pupils are equal, round, and reactive to light.  Neck: Normal range of motion. Neck supple. Carotid bruit is not present.  Cardiovascular: Normal rate, regular rhythm, normal heart sounds, intact distal pulses and normal pulses.   Pulmonary/Chest: Breath sounds normal. No respiratory distress.  Abdominal: Soft. Bowel sounds are normal. There is no tenderness. There is no guarding.  Musculoskeletal: Normal range of motion.  Pain to palpation and change of position of the lower back. No hot area. No palpable step off.   Lymphadenopathy:       Head (right side): No submandibular adenopathy present.       Head (left side): No submandibular adenopathy present.    She has no cervical adenopathy.  Neurological: She is alert and oriented to person, place, and time. She has normal strength. No cranial nerve deficit or sensory deficit.  No gross neurovascular changes.  Skin: Skin is warm and dry.  Psychiatric: Her speech is normal. Her mood appears anxious.    ED Course  Procedures (including critical care time) Labs Review Labs Reviewed - No data to display Imaging Review No results found.  MDM  No diagnosis found. *I have reviewed nursing notes, vital signs, and all appropriate lab and imaging results for this patient.**  Pt has hx of previous back injury. Has flare of pain and problem from time to time. No gross neuro deficit on today's exam. Pt advised of today's findings. Rx for diclofenac and decadron given to the patient. Pt to see PCP or previous  orthopedic MD for follow up and recheck.  Kathie Dike, PA-C 09/27/13 [phone]

## 2013-09-27 NOTE — ED Provider Notes (Signed)
Medical screening examination/treatment/procedure(s) were performed by non-physician practitioner and as supervising physician I was immediately available for consultation/collaboration.   Sanvi Ehler M Maryanna Stuber, DO 09/27/13 1736 

## 2013-10-27 ENCOUNTER — Encounter (HOSPITAL_COMMUNITY): Payer: Self-pay | Admitting: Emergency Medicine

## 2013-10-27 ENCOUNTER — Emergency Department (HOSPITAL_COMMUNITY)
Admission: EM | Admit: 2013-10-27 | Discharge: 2013-10-27 | Disposition: A | Discharge status: Home / Self Care | Payer: Medicare Other | Attending: Emergency Medicine | Admitting: Emergency Medicine

## 2013-10-27 DIAGNOSIS — Z860100 Personal history of colon polyps, unspecified: Secondary | ICD-10-CM | POA: Insufficient documentation

## 2013-10-27 DIAGNOSIS — F329 Major depressive disorder, single episode, unspecified: Secondary | ICD-10-CM | POA: Insufficient documentation

## 2013-10-27 DIAGNOSIS — Z8619 Personal history of other infectious and parasitic diseases: Secondary | ICD-10-CM | POA: Insufficient documentation

## 2013-10-27 DIAGNOSIS — E785 Hyperlipidemia, unspecified: Secondary | ICD-10-CM | POA: Insufficient documentation

## 2013-10-27 DIAGNOSIS — I10 Essential (primary) hypertension: Secondary | ICD-10-CM | POA: Insufficient documentation

## 2013-10-27 DIAGNOSIS — Z9889 Other specified postprocedural states: Secondary | ICD-10-CM | POA: Insufficient documentation

## 2013-10-27 DIAGNOSIS — F3289 Other specified depressive episodes: Secondary | ICD-10-CM | POA: Insufficient documentation

## 2013-10-27 DIAGNOSIS — F17200 Nicotine dependence, unspecified, uncomplicated: Secondary | ICD-10-CM | POA: Insufficient documentation

## 2013-10-27 DIAGNOSIS — M79609 Pain in unspecified limb: Secondary | ICD-10-CM | POA: Insufficient documentation

## 2013-10-27 DIAGNOSIS — Z8701 Personal history of pneumonia (recurrent): Secondary | ICD-10-CM | POA: Insufficient documentation

## 2013-10-27 DIAGNOSIS — G8911 Acute pain due to trauma: Secondary | ICD-10-CM | POA: Insufficient documentation

## 2013-10-27 DIAGNOSIS — G8929 Other chronic pain: Secondary | ICD-10-CM | POA: Insufficient documentation

## 2013-10-27 DIAGNOSIS — K219 Gastro-esophageal reflux disease without esophagitis: Secondary | ICD-10-CM | POA: Insufficient documentation

## 2013-10-27 DIAGNOSIS — F411 Generalized anxiety disorder: Secondary | ICD-10-CM | POA: Insufficient documentation

## 2013-10-27 DIAGNOSIS — Z79899 Other long term (current) drug therapy: Secondary | ICD-10-CM | POA: Insufficient documentation

## 2013-10-27 MED ORDER — OXYCODONE-ACETAMINOPHEN 5-325 MG PO TABS: 1.0000 | ORAL_TABLET | ORAL | Status: DC | PRN

## 2013-10-27 NOTE — ED Notes (Addendum)
Pain in right heel from surgery several months ago.  Out of pain meds and has to make appointment to see the surgeon for refill. Pain severe tonight.  Was seen here a month ago and the pain meds given did not help

## 2013-10-27 NOTE — ED Provider Notes (Signed)
CSN: [id number]     Arrival date & time 10/27/13  2034 History   First MD Initiated Contact with Patient 10/27/13 2158     Chief Complaint  Patient presents with  . Foot Pain    heel surgery- out of pain medication   (Consider location/radiation/quality/duration/timing/severity/associated sxs/prior Treatment) Patient is a 61 y.o. female presenting with lower extremity pain. The history is provided by the patient.  Foot Pain This is a chronic problem. The problem occurs constantly. The problem has been unchanged. The symptoms are aggravated by walking and standing.   [name] is a 62 y.o. female who presents to the ED with right foot pain. The pain started 4 months ago when she fell down her steps. The injury required surgery for open fracture. She has been taking Percocet 5/325 and had an appointment with him for follow up next Wed. But they called and cancelled because he was going to be out of the office. She is out of her pain medication and needs something until she can see him. History of chronic pain. Past Medical History  Diagnosis Date  . Disc degeneration   . Hyperlipemia     takes Pravastatin daily  . Brain tumor (benign)   . Pinched nerve in shoulder   . Degenerative disc disease   . Hypertension     takes Lotrel daily  . Depression     takes Effexor daily  . GERD (gastroesophageal reflux disease)     takes Omeprazole daily  . Anxiety     takes Xanax daily  . History of bronchitis     uses inhaler daily  . Pneumonia 2012  . Numbness in both hands   . Joint pain   . Chronic back pain     bulding disc  . Osteoporosis     Fosamax weekly  . Hepatitis     Hep C  . History of colon polyps   . History of staph infection    Past Surgical History  Procedure Laterality Date  . Knee surgery Right   . Facial fracture surgery      in the late 90's  . Ankle fracture surgery Bilateral early 2000's  . Tubal ligation    . Colonoscopy    . Orif calcaneous  fracture Right 07/13/2013    Procedure: RIGHT OPEN REDUCTION INTERNAL FIXATION (ORIF) CALCANEUS FRACTURE;  Surgeon: Nadara Mustard, MD;  Location: [facility] OR;  Service: Orthopedics;  Laterality: Right;   No family history on file. History  Substance Use Topics  . Smoking status: Current Every Day Smoker -- 0.50 packs/day for 40 years    Types: Cigarettes  . Smokeless tobacco: Not on file  . Alcohol Use: No   OB History   Grav Para Term Preterm Abortions TAB SAB Ect Mult Living                 Review of Systems Negative except as stated in HPI  Allergies  Review of patient's allergies indicates no known allergies.  Home Medications   Current Outpatient Rx  Name  Route  Sig  Dispense  Refill  . albuterol (PROVENTIL HFA;VENTOLIN HFA) 108 (90 BASE) MCG/ACT inhaler   Inhalation   Inhale 2 puffs into the lungs every 6 (six) hours as needed for wheezing.         Marland Kitchen alendronate (FOSAMAX) 70 MG tablet   Oral   Take 70 mg by mouth every 7 (seven) days. On mondays. Take with a full  glass of water on an empty stomach.          Marland Kitchen amLODipine-benazepril (LOTREL) 5-10 MG per capsule   Oral   Take 1 capsule by mouth daily.         Marland Kitchen aspirin-acetaminophen-caffeine (EXCEDRIN MIGRAINE) 250-250-65 MG per tablet   Oral   Take 1 tablet by mouth every 6 (six) hours as needed for pain.         . cholecalciferol (VITAMIN D) 1000 UNITS tablet   Oral   Take 1,000 Units by mouth daily.         . diazepam (VALIUM) 10 MG tablet   Oral   Take 1 tablet by mouth 3 (three) times daily.         Marland Kitchen gabapentin (NEURONTIN) 600 MG tablet   Oral   Take 600 mg by mouth 3 (three) times daily.           Chilton Si Tea, Camillia sinensis, (GREEN TEA PO)   Oral   Take 1 tablet by mouth daily.         . Omeprazole (PRILOSEC PO)   Oral   Take 1 tablet by mouth daily.         . pravastatin (PRAVACHOL) 20 MG tablet   Oral   Take 20 mg by mouth daily.         Marland Kitchen venlafaxine (EFFEXOR-XR) 150 MG  24 hr capsule   Oral   Take 300 mg by mouth daily.            BP 124/90  Pulse 80  Temp(Src) 98.2 F (36.8 C) (Oral)  Resp 20  Ht 5\' 2"  (1.575 m)  Wt 145 lb (65.772 kg)  BMI 26.51 kg/m2  SpO2 94% Physical Exam  Nursing note and vitals reviewed. Constitutional: She is oriented to person, place, and time. She appears well-developed and well-nourished.  HENT:  Head: Normocephalic and atraumatic.  Eyes: Conjunctivae and EOM are normal.  Neck: Neck supple.  Cardiovascular: Normal rate.   Pulmonary/Chest: Effort normal.  Musculoskeletal:       Right foot: She exhibits decreased range of motion, tenderness and deformity.       Feet:  There is deformity noted to the right foot and ankle s/p surgery for broken bones. Pedal pulse present, adequate circulation, good touch sensation.   Neurological: She is alert and oriented to person, place, and time. No cranial nerve deficit.  Skin: Skin is warm and dry.  Psychiatric: She has a normal mood and affect. Her behavior is normal.    ED Course  Procedures  MDM  62 y.o. female with chronic pain s/p multiple fractures and surgery to the left foot and ankle 4 months ago. Explained to the patient that chronic pain is not treated in the the ED and she will need to follow up with her PCP or orthopedic doctor for continued pain management. Will give medication for pain for tonight. Patient stable for discharge home without any immediate complications. She remains neurovascularly intact.     [facility] [name], [place] 10/28/13 [phone]

## 2013-10-31 NOTE — ED Provider Notes (Signed)
Medical screening examination/treatment/procedure(s) were performed by non-physician practitioner and as supervising physician I was immediately available for consultation/collaboration.  EKG Interpretation   None         Shelda Jakes, MD 10/31/13 [phone]

## 2013-11-12 MED FILL — Oxycodone w/ Acetaminophen Tab 5-325 MG: ORAL | Qty: 6 | Status: AC

## 2013-11-18 ENCOUNTER — Encounter (HOSPITAL_COMMUNITY): Payer: Self-pay | Admitting: Emergency Medicine

## 2013-11-18 ENCOUNTER — Emergency Department (HOSPITAL_COMMUNITY)
Admission: EM | Admit: 2013-11-18 | Discharge: 2013-11-18 | Disposition: A | Discharge status: Home / Self Care | Payer: Medicare Other | Attending: Emergency Medicine | Admitting: Emergency Medicine

## 2013-11-18 ENCOUNTER — Emergency Department (HOSPITAL_COMMUNITY): Payer: Medicare Other

## 2013-11-18 DIAGNOSIS — F329 Major depressive disorder, single episode, unspecified: Secondary | ICD-10-CM | POA: Insufficient documentation

## 2013-11-18 DIAGNOSIS — M549 Dorsalgia, unspecified: Secondary | ICD-10-CM | POA: Insufficient documentation

## 2013-11-18 DIAGNOSIS — Z860100 Personal history of colon polyps, unspecified: Secondary | ICD-10-CM | POA: Insufficient documentation

## 2013-11-18 DIAGNOSIS — F411 Generalized anxiety disorder: Secondary | ICD-10-CM | POA: Insufficient documentation

## 2013-11-18 DIAGNOSIS — Y92410 Unspecified street and highway as the place of occurrence of the external cause: Secondary | ICD-10-CM | POA: Insufficient documentation

## 2013-11-18 DIAGNOSIS — F3289 Other specified depressive episodes: Secondary | ICD-10-CM | POA: Insufficient documentation

## 2013-11-18 DIAGNOSIS — M542 Cervicalgia: Secondary | ICD-10-CM | POA: Insufficient documentation

## 2013-11-18 DIAGNOSIS — K219 Gastro-esophageal reflux disease without esophagitis: Secondary | ICD-10-CM | POA: Insufficient documentation

## 2013-11-18 DIAGNOSIS — E785 Hyperlipidemia, unspecified: Secondary | ICD-10-CM | POA: Insufficient documentation

## 2013-11-18 DIAGNOSIS — S93401A Sprain of unspecified ligament of right ankle, initial encounter: Secondary | ICD-10-CM

## 2013-11-18 DIAGNOSIS — S93409A Sprain of unspecified ligament of unspecified ankle, initial encounter: Secondary | ICD-10-CM | POA: Insufficient documentation

## 2013-11-18 DIAGNOSIS — I10 Essential (primary) hypertension: Secondary | ICD-10-CM | POA: Insufficient documentation

## 2013-11-18 DIAGNOSIS — IMO0001 Reserved for inherently not codable concepts without codable children: Secondary | ICD-10-CM | POA: Insufficient documentation

## 2013-11-18 DIAGNOSIS — S32009A Unspecified fracture of unspecified lumbar vertebra, initial encounter for closed fracture: Secondary | ICD-10-CM | POA: Insufficient documentation

## 2013-11-18 DIAGNOSIS — Z8619 Personal history of other infectious and parasitic diseases: Secondary | ICD-10-CM | POA: Insufficient documentation

## 2013-11-18 DIAGNOSIS — Y9389 Activity, other specified: Secondary | ICD-10-CM | POA: Insufficient documentation

## 2013-11-18 DIAGNOSIS — S32000A Wedge compression fracture of unspecified lumbar vertebra, initial encounter for closed fracture: Secondary | ICD-10-CM

## 2013-11-18 DIAGNOSIS — Z8669 Personal history of other diseases of the nervous system and sense organs: Secondary | ICD-10-CM | POA: Insufficient documentation

## 2013-11-18 DIAGNOSIS — G8929 Other chronic pain: Secondary | ICD-10-CM | POA: Insufficient documentation

## 2013-11-18 MED ORDER — OXYCODONE-ACETAMINOPHEN 5-325 MG PO TABS
1.0000 | ORAL_TABLET | Freq: Once | ORAL | Status: AC
  Administered 2013-11-18: 1 via ORAL
  Filled 2013-11-18: qty 1

## 2013-11-18 MED ORDER — OXYCODONE-ACETAMINOPHEN 7.5-325 MG PO TABS: 1.0000 | ORAL_TABLET | ORAL | Status: DC | PRN
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
## 2013-12-20 ENCOUNTER — Encounter (HOSPITAL_COMMUNITY): Payer: Self-pay | Admitting: Emergency Medicine

## 2013-12-20 ENCOUNTER — Emergency Department (HOSPITAL_COMMUNITY)
Admission: EM | Admit: 2013-12-20 | Discharge: 2013-12-20 | Discharge status: Against Medical Advice | Payer: Medicare Other | Attending: Emergency Medicine | Admitting: Emergency Medicine

## 2013-12-20 DIAGNOSIS — K219 Gastro-esophageal reflux disease without esophagitis: Secondary | ICD-10-CM | POA: Insufficient documentation

## 2013-12-20 DIAGNOSIS — Z8701 Personal history of pneumonia (recurrent): Secondary | ICD-10-CM | POA: Insufficient documentation

## 2013-12-20 DIAGNOSIS — M549 Dorsalgia, unspecified: Secondary | ICD-10-CM | POA: Insufficient documentation

## 2013-12-20 DIAGNOSIS — G8929 Other chronic pain: Secondary | ICD-10-CM

## 2013-12-20 DIAGNOSIS — F329 Major depressive disorder, single episode, unspecified: Secondary | ICD-10-CM | POA: Insufficient documentation

## 2013-12-20 DIAGNOSIS — Z860100 Personal history of colon polyps, unspecified: Secondary | ICD-10-CM | POA: Insufficient documentation

## 2013-12-20 DIAGNOSIS — I10 Essential (primary) hypertension: Secondary | ICD-10-CM | POA: Insufficient documentation

## 2013-12-20 DIAGNOSIS — F17200 Nicotine dependence, unspecified, uncomplicated: Secondary | ICD-10-CM | POA: Insufficient documentation

## 2013-12-20 DIAGNOSIS — Z8709 Personal history of other diseases of the respiratory system: Secondary | ICD-10-CM | POA: Insufficient documentation

## 2013-12-20 DIAGNOSIS — G8921 Chronic pain due to trauma: Secondary | ICD-10-CM | POA: Insufficient documentation

## 2013-12-20 DIAGNOSIS — Z9889 Other specified postprocedural states: Secondary | ICD-10-CM | POA: Insufficient documentation

## 2013-12-20 DIAGNOSIS — E785 Hyperlipidemia, unspecified: Secondary | ICD-10-CM | POA: Insufficient documentation

## 2013-12-20 DIAGNOSIS — Z79899 Other long term (current) drug therapy: Secondary | ICD-10-CM | POA: Insufficient documentation

## 2013-12-20 DIAGNOSIS — Z8619 Personal history of other infectious and parasitic diseases: Secondary | ICD-10-CM | POA: Insufficient documentation

## 2013-12-20 DIAGNOSIS — F411 Generalized anxiety disorder: Secondary | ICD-10-CM | POA: Insufficient documentation

## 2013-12-20 DIAGNOSIS — Z8669 Personal history of other diseases of the nervous system and sense organs: Secondary | ICD-10-CM | POA: Insufficient documentation

## 2013-12-20 DIAGNOSIS — F3289 Other specified depressive episodes: Secondary | ICD-10-CM | POA: Insufficient documentation

## 2013-12-20 MED ORDER — HYDROCODONE-ACETAMINOPHEN 5-325 MG PO TABS: 2.0000 | ORAL_TABLET | Freq: Once | ORAL | Status: DC
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
## 2013-12-20 NOTE — ED Notes (Signed)
Pt also c/o chronic back and ankle pain. States she has been unable to get her pain medication from PCP/ortho and has been buying them off the street due to severity.

## 2013-12-20 NOTE — ED Notes (Signed)
Patient with c/o soreness to chest and abdomen x 2 weeks. Constant. States cough for two weeks, states green sputum.

## 2013-12-20 NOTE — ED Notes (Signed)
Pt came to nurses station yelling and cursing saying, " I don't need that Doctor's pity. He can keep his pain pill and give it to someone who will sell them on the streets. I will just go buy what I need off the streets." pt left ER without signing out. Will notify Dr.

## 2014-01-22 ENCOUNTER — Encounter (HOSPITAL_COMMUNITY): Payer: Self-pay | Admitting: Psychiatry

## 2014-01-22 ENCOUNTER — Ambulatory Visit (INDEPENDENT_AMBULATORY_CARE_PROVIDER_SITE_OTHER): Payer: Medicare Other | Admitting: Psychiatry

## 2014-01-22 VITALS — BP 110/70 | Ht 62.0 in | Wt 147.0 lb

## 2014-01-22 DIAGNOSIS — F411 Generalized anxiety disorder: Secondary | ICD-10-CM

## 2014-01-22 DIAGNOSIS — F332 Major depressive disorder, recurrent severe without psychotic features: Secondary | ICD-10-CM

## 2014-01-22 MED ORDER — ALPRAZOLAM 2 MG PO TABS: 2.0000 mg | ORAL_TABLET | Freq: Three times a day (TID) | ORAL | Status: DC
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
## 2014-01-26 ENCOUNTER — Encounter (HOSPITAL_COMMUNITY): Payer: Self-pay | Admitting: Emergency Medicine

## 2014-01-26 ENCOUNTER — Emergency Department (HOSPITAL_COMMUNITY)
Admission: EM | Admit: 2014-01-26 | Discharge: 2014-01-26 | Disposition: A | Discharge status: Home / Self Care | Payer: Medicare Other | Attending: Emergency Medicine | Admitting: Emergency Medicine

## 2014-01-26 ENCOUNTER — Emergency Department (HOSPITAL_COMMUNITY): Payer: Medicare Other

## 2014-01-26 DIAGNOSIS — Z8669 Personal history of other diseases of the nervous system and sense organs: Secondary | ICD-10-CM | POA: Insufficient documentation

## 2014-01-26 DIAGNOSIS — Z860100 Personal history of colon polyps, unspecified: Secondary | ICD-10-CM | POA: Insufficient documentation

## 2014-01-26 DIAGNOSIS — R42 Dizziness and giddiness: Secondary | ICD-10-CM | POA: Insufficient documentation

## 2014-01-26 DIAGNOSIS — F329 Major depressive disorder, single episode, unspecified: Secondary | ICD-10-CM | POA: Insufficient documentation

## 2014-01-26 DIAGNOSIS — I10 Essential (primary) hypertension: Secondary | ICD-10-CM | POA: Insufficient documentation

## 2014-01-26 DIAGNOSIS — Z8619 Personal history of other infectious and parasitic diseases: Secondary | ICD-10-CM | POA: Insufficient documentation

## 2014-01-26 DIAGNOSIS — R5383 Other fatigue: Secondary | ICD-10-CM

## 2014-01-26 DIAGNOSIS — R197 Diarrhea, unspecified: Secondary | ICD-10-CM

## 2014-01-26 DIAGNOSIS — Z79899 Other long term (current) drug therapy: Secondary | ICD-10-CM | POA: Insufficient documentation

## 2014-01-26 DIAGNOSIS — Z8701 Personal history of pneumonia (recurrent): Secondary | ICD-10-CM | POA: Insufficient documentation

## 2014-01-26 DIAGNOSIS — G8929 Other chronic pain: Secondary | ICD-10-CM | POA: Insufficient documentation

## 2014-01-26 DIAGNOSIS — Z87891 Personal history of nicotine dependence: Secondary | ICD-10-CM | POA: Insufficient documentation

## 2014-01-26 DIAGNOSIS — M810 Age-related osteoporosis without current pathological fracture: Secondary | ICD-10-CM | POA: Insufficient documentation

## 2014-01-26 DIAGNOSIS — R5381 Other malaise: Secondary | ICD-10-CM | POA: Insufficient documentation

## 2014-01-26 DIAGNOSIS — K921 Melena: Secondary | ICD-10-CM | POA: Insufficient documentation

## 2014-01-26 DIAGNOSIS — R109 Unspecified abdominal pain: Secondary | ICD-10-CM | POA: Insufficient documentation

## 2014-01-26 DIAGNOSIS — K219 Gastro-esophageal reflux disease without esophagitis: Secondary | ICD-10-CM | POA: Insufficient documentation

## 2014-01-26 DIAGNOSIS — F17200 Nicotine dependence, unspecified, uncomplicated: Secondary | ICD-10-CM | POA: Insufficient documentation

## 2014-01-26 DIAGNOSIS — R110 Nausea: Secondary | ICD-10-CM | POA: Insufficient documentation

## 2014-01-26 DIAGNOSIS — E785 Hyperlipidemia, unspecified: Secondary | ICD-10-CM | POA: Insufficient documentation

## 2014-01-26 DIAGNOSIS — Z8709 Personal history of other diseases of the respiratory system: Secondary | ICD-10-CM | POA: Insufficient documentation

## 2014-01-26 DIAGNOSIS — F411 Generalized anxiety disorder: Secondary | ICD-10-CM | POA: Insufficient documentation

## 2014-01-26 DIAGNOSIS — Z86011 Personal history of benign neoplasm of the brain: Secondary | ICD-10-CM | POA: Insufficient documentation

## 2014-01-26 DIAGNOSIS — Z8739 Personal history of other diseases of the musculoskeletal system and connective tissue: Secondary | ICD-10-CM | POA: Insufficient documentation

## 2014-01-26 DIAGNOSIS — F3289 Other specified depressive episodes: Secondary | ICD-10-CM | POA: Insufficient documentation

## 2014-01-26 DIAGNOSIS — IMO0001 Reserved for inherently not codable concepts without codable children: Secondary | ICD-10-CM | POA: Insufficient documentation

## 2014-01-26 DIAGNOSIS — E876 Hypokalemia: Secondary | ICD-10-CM | POA: Insufficient documentation

## 2014-01-26 LAB — COMPREHENSIVE METABOLIC PANEL
ALBUMIN: 3.1 g/dL — AB (ref 3.5–5.2)
ALT: 34 U/L (ref 0–35)
AST: 40 U/L — ABNORMAL HIGH (ref 0–37)
Alkaline Phosphatase: 51 U/L (ref 39–117)
BUN: 5 mg/dL — AB (ref 6–23)
CALCIUM: 8.1 mg/dL — AB (ref 8.4–10.5)
CO2: 25 mEq/L (ref 19–32)
CREATININE: 0.74 mg/dL (ref 0.50–1.10)
Chloride: 102 mEq/L (ref 96–112)
GFR calc Af Amer: >90 mL/min (ref >90)
GFR calc non Af Amer: 89 mL/min — ABNORMAL LOW (ref >90)
Glucose, Bld: 96 mg/dL (ref 70–99)
Potassium: 2.9 mEq/L — CL (ref 3.7–5.3)
Sodium: 139 mEq/L (ref 137–147)
TOTAL PROTEIN: 6.4 g/dL (ref 6.0–8.3)
Total Bilirubin: 0.1 mg/dL — ABNORMAL LOW (ref 0.3–1.2)

## 2014-01-26 LAB — POTASSIUM: Potassium: 3.7 mEq/L (ref 3.7–5.3)

## 2014-01-26 LAB — CBC WITH DIFFERENTIAL/PLATELET
BASOS ABS: 0 10*3/uL (ref 0.0–0.1)
Basophils Relative: 0 % (ref 0–1)
Eosinophils Absolute: 0.1 10*3/uL (ref 0.0–0.7)
Eosinophils Relative: 1 % (ref 0–5)
HEMATOCRIT: 37.6 % (ref 36.0–46.0)
HEMOGLOBIN: 12.7 g/dL (ref 12.0–15.0)
LYMPHS PCT: 15 % (ref 12–46)
Lymphs Abs: 0.9 10*3/uL (ref 0.7–4.0)
MCH: 32.3 pg (ref 26.0–34.0)
MCHC: 33.8 g/dL (ref 30.0–36.0)
MCV: 95.7 fL (ref 78.0–100.0)
MONO ABS: 0.8 10*3/uL (ref 0.1–1.0)
MONOS PCT: 13 % — AB (ref 3–12)
NEUTROS ABS: 4.2 10*3/uL (ref 1.7–7.7)
Neutrophils Relative %: 71 % (ref 43–77)
Platelets: 206 10*3/uL (ref 150–400)
RBC: 3.93 MIL/uL (ref 3.87–5.11)
RDW: 13.2 % (ref 11.5–15.5)
WBC: 6 10*3/uL (ref 4.0–10.5)

## 2014-01-26 LAB — LIPASE, BLOOD: LIPASE: 15 U/L (ref 11–59)

## 2014-01-26 MED ORDER — SODIUM CHLORIDE 0.9 % IV BOLUS (SEPSIS)
1000.0000 mL | Freq: Once | INTRAVENOUS | Status: AC
  Administered 2014-01-26: 1000 mL via INTRAVENOUS

## 2014-01-26 MED ORDER — POTASSIUM CHLORIDE ER 10 MEQ PO TBCR: 10.0000 meq | EXTENDED_RELEASE_TABLET | Freq: Two times a day (BID) | ORAL | Status: DC

## 2014-01-26 MED ORDER — POTASSIUM CHLORIDE 10 MEQ/100ML IV SOLN
INTRAVENOUS | Status: AC
  Administered 2014-01-26: 10 meq
  Filled 2014-01-26: qty 100

## 2014-01-26 MED ORDER — HYDROMORPHONE HCL PF 1 MG/ML IJ SOLN
0.5000 mg | Freq: Once | INTRAMUSCULAR | Status: AC
  Administered 2014-01-26: 0.5 mg via INTRAVENOUS
  Filled 2014-01-26: qty 1

## 2014-01-26 MED ORDER — POTASSIUM CHLORIDE 10 MEQ/100ML IV SOLN
10.0000 meq | Freq: Once | INTRAVENOUS | Status: DC
  Filled 2014-01-26: qty 100

## 2014-01-26 MED ORDER — POTASSIUM CHLORIDE CRYS ER 20 MEQ PO TBCR
40.0000 meq | EXTENDED_RELEASE_TABLET | Freq: Once | ORAL | Status: AC
  Administered 2014-01-26: 40 meq via ORAL
  Filled 2014-01-26: qty 2

## 2014-01-26 MED ORDER — POTASSIUM CHLORIDE 10 MEQ/100ML IV SOLN
10.0000 meq | Freq: Once | INTRAVENOUS | Status: AC
  Administered 2014-01-26: 10 meq via INTRAVENOUS

## 2014-01-26 MED ORDER — ONDANSETRON HCL 4 MG/2ML IJ SOLN
4.0000 mg | Freq: Once | INTRAMUSCULAR | Status: AC
  Administered 2014-01-26: 4 mg via INTRAVENOUS
  Filled 2014-01-26: qty 2

## 2014-01-26 MED ORDER — SODIUM CHLORIDE 0.9 % IV SOLN
INTRAVENOUS | Status: DC
Start: 2014-01-26 — End: 2014-01-27
  Administered 2014-01-26: 16:00:00 via INTRAVENOUS
  Administered 2014-01-26: 1000 mL via INTRAVENOUS

## 2014-01-26 MED ORDER — LOPERAMIDE HCL 2 MG PO TABS: 2.0000 mg | ORAL_TABLET | Freq: Four times a day (QID) | ORAL | Status: DC | PRN

## 2014-01-26 NOTE — ED Notes (Signed)
Pt resting on stretcher. Provided Sprite per request. Tolerating PO fluids well. Has has two loose bowel movements during stay in ED. No complaints of pain currently

## 2014-01-26 NOTE — ED Notes (Signed)
Diarrhea and body aches for the past 3 days

## 2014-01-26 NOTE — Discharge Instructions (Signed)
Symptoms consistent with viral diarrheal illness. The low potassium probably developed from the frequent bowel movements. Potassium improved here with IV and oral potassium supplement. Prescription provided for Imodium right ear. Also prescription provided for potassium supplement for the next 2 days. Return for any newer worse symptoms. If not improved in a few days followup with your Dr.

## 2014-01-26 NOTE — ED Notes (Signed)
CRITICAL VALUE ALERT  Critical value received:  Potassium  Date of notification:  01/26/14  Time of notification:  2820  Critical value read back:yes  Nurse who received alert:  Jeanice Lim, RN  MD notified (1st page):  Dr Rogene Houston at [phone]

## 2014-01-26 NOTE — ED Provider Notes (Signed)
CSN: [id number]     Arrival date & time 01/26/14  1437 History   First MD Initiated Contact with Patient 01/26/14 1511     Chief Complaint  Patient presents with  . Diarrhea   (Consider location/radiation/quality/duration/timing/severity/associated sxs/prior Treatment) Patient is a 63 y.o. female presenting with diarrhea. The history is provided by the patient.  Diarrhea Associated symptoms: abdominal pain and myalgias   Associated symptoms: no fever, no headaches and no vomiting    patient with diarrhea and bodyaches for the past 3 days. Patient's had multiple episodes of loose bowel movements with some mucousy type material. Patient yesterday had one bowel movement with some streaks of blood. None since. Symptoms associated with some crampy mild abdominal pain and bodyaches dizziness nausea but no vomiting. Patient states that she's had about 15 bowel movements. This would be in the past 3 days.  Past Medical History  Diagnosis Date  . Disc degeneration   . Hyperlipemia     takes Pravastatin daily  . Brain tumor (benign)   . Pinched nerve in shoulder   . Degenerative disc disease   . Hypertension     takes Lotrel daily  . Depression     takes Effexor daily  . GERD (gastroesophageal reflux disease)     takes Omeprazole daily  . Anxiety     takes Xanax daily  . History of bronchitis     uses inhaler daily  . Pneumonia 2012  . Numbness in both hands   . Joint pain   . Chronic back pain     bulding disc  . Osteoporosis     Fosamax weekly  . Hepatitis     Hep C  . History of colon polyps   . History of staph infection    Past Surgical History  Procedure Laterality Date  . Knee surgery Right   . Facial fracture surgery      in the late 90's  . Ankle fracture surgery Bilateral early 2000's  . Tubal ligation    . Colonoscopy    . Orif calcaneous fracture Right 07/13/2013    Procedure: RIGHT OPEN REDUCTION INTERNAL FIXATION (ORIF) CALCANEUS FRACTURE;  Surgeon: Newt Minion, MD;  Location: [facility];  Service: Orthopedics;  Laterality: Right;   Family History  Problem Relation Age of Onset  . Bipolar disorder Daughter   . Drug abuse Son   . Drug abuse Grandchild   . Drug abuse Grandchild    History  Substance Use Topics  . Smoking status: Current Every Day Smoker -- 0.50 packs/day for 40 years    Types: Cigarettes  . Smokeless tobacco: Not on file  . Alcohol Use: No   OB History   Grav Para Term Preterm Abortions TAB SAB Ect Mult Living                 Review of Systems  Constitutional: Positive for fatigue. Negative for fever.  HENT: Negative for congestion.   Eyes: Negative for visual disturbance.  Respiratory: Negative for shortness of breath.   Cardiovascular: Negative for chest pain.  Gastrointestinal: Positive for nausea, abdominal pain, diarrhea and blood in stool. Negative for vomiting.  Genitourinary: Negative for dysuria.  Musculoskeletal: Positive for myalgias.  Skin: Negative for rash.  Neurological: Positive for dizziness. Negative for headaches.  Hematological: Does not bruise/bleed easily.  Psychiatric/Behavioral: Negative for confusion.    Allergies  Review of patient's allergies indicates no known allergies.  Home Medications   Current Outpatient Rx  Name  Route  Sig  Dispense  Refill  . albuterol (PROVENTIL HFA;VENTOLIN HFA) 108 (90 BASE) MCG/ACT inhaler   Inhalation   Inhale 2 puffs into the lungs every 6 (six) hours as needed for wheezing.         Marland Kitchen alprazolam (XANAX) 2 MG tablet   Oral   Take 1 tablet (2 mg total) by mouth 3 (three) times daily.   90 tablet   2   . amLODipine-benazepril (LOTREL) 5-10 MG per capsule   Oral   Take 1 capsule by mouth daily.         Marland Kitchen aspirin-acetaminophen-caffeine (EXCEDRIN MIGRAINE) 250-250-65 MG per tablet   Oral   Take 1 tablet by mouth every 6 (six) hours as needed for pain.         . cholecalciferol (VITAMIN D) 1000 UNITS tablet   Oral   Take 1,000 Units by  mouth daily.         Marland Kitchen gabapentin (NEURONTIN) 600 MG tablet   Oral   Take 600 mg by mouth 3 (three) times daily.           Nyoka Cowden Tea, Camillia sinensis, (GREEN TEA PO)   Oral   Take 1 tablet by mouth daily.         Marland Kitchen omeprazole (PRILOSEC) 20 MG capsule   Oral   Take 20 mg by mouth daily.         Marland Kitchen oxyCODONE-acetaminophen (PERCOCET) 7.5-325 MG per tablet   Oral   Take 1 tablet by mouth every 4 (four) hours as needed for pain.   20 tablet   0   . pravastatin (PRAVACHOL) 20 MG tablet   Oral   Take 20 mg by mouth daily.         Marland Kitchen venlafaxine (EFFEXOR-XR) 150 MG 24 hr capsule   Oral   Take 300 mg by mouth daily.           Marland Kitchen alendronate (FOSAMAX) 70 MG tablet   Oral   Take 70 mg by mouth every 7 (seven) days. On mondays. Take with a full glass of water on an empty stomach.          . loperamide (IMODIUM A-D) 2 MG tablet   Oral   Take 1 tablet (2 mg total) by mouth 4 (four) times daily as needed for diarrhea or loose stools.   30 tablet   0   . potassium chloride (K-DUR) 10 MEQ tablet   Oral   Take 1 tablet (10 mEq total) by mouth 2 (two) times daily.   4 tablet   0    BP 111/71  Pulse 62  Temp(Src) 97.7 F (36.5 C) (Oral)  Resp 18  Ht 5\' 2"  (1.575 m)  Wt 147 lb (66.679 kg)  BMI 26.88 kg/m2  SpO2 94% Physical Exam  Nursing note and vitals reviewed. Constitutional: She is oriented to person, place, and time. She appears well-developed and well-nourished. No distress.  HENT:  Head: Normocephalic and atraumatic.  Mouth/Throat: Oropharynx is clear and moist.  Eyes: Conjunctivae and EOM are normal. Pupils are equal, round, and reactive to light.  Neck: Normal range of motion. Neck supple.  Cardiovascular: Normal rate, regular rhythm and normal heart sounds.   Pulmonary/Chest: Effort normal and breath sounds normal. No respiratory distress.  Abdominal: Soft. Bowel sounds are normal. There is no tenderness.  Musculoskeletal: Normal range of motion.  She exhibits no edema.  Neurological: She is alert and oriented to person,  place, and time. No cranial nerve deficit. She exhibits normal muscle tone. Coordination normal.  Skin: Skin is warm. No rash noted.    ED Course  Procedures (including critical care time) Labs Review Labs Reviewed  COMPREHENSIVE METABOLIC PANEL - Abnormal; Notable for the following:    Potassium 2.9 (*)    BUN 5 (*)    Calcium 8.1 (*)    Albumin 3.1 (*)    AST 40 (*)    Total Bilirubin 0.1 (*)    GFR calc non Af Amer 89 (*)    All other components within normal limits  CBC WITH DIFFERENTIAL - Abnormal; Notable for the following:    Monocytes Relative 13 (*)    All other components within normal limits  LIPASE, BLOOD  POTASSIUM   Results for orders placed during the hospital encounter of 01/26/14  COMPREHENSIVE METABOLIC PANEL      Result Value Range   Sodium 139  137 - 147 mEq/L   Potassium 2.9 (*) 3.7 - 5.3 mEq/L   Chloride 102  96 - 112 mEq/L   CO2 25  19 - 32 mEq/L   Glucose, Bld 96  70 - 99 mg/dL   BUN 5 (*) 6 - 23 mg/dL   Creatinine, Ser 0.74  0.50 - 1.10 mg/dL   Calcium 8.1 (*) 8.4 - 10.5 mg/dL   Total Protein 6.4  6.0 - 8.3 g/dL   Albumin 3.1 (*) 3.5 - 5.2 g/dL   AST 40 (*) 0 - 37 U/L   ALT 34  0 - 35 U/L   Alkaline Phosphatase 51  39 - 117 U/L   Total Bilirubin 0.1 (*) 0.3 - 1.2 mg/dL   GFR calc non Af Amer 89 (*) >90 mL/min   GFR calc Af Amer >90  >90 mL/min  LIPASE, BLOOD      Result Value Range   Lipase 15  11 - 59 U/L  CBC WITH DIFFERENTIAL      Result Value Range   WBC 6.0  4.0 - 10.5 K/uL   RBC 3.93  3.87 - 5.11 MIL/uL   Hemoglobin 12.7  12.0 - 15.0 g/dL   HCT 37.6  36.0 - 46.0 %   MCV 95.7  78.0 - 100.0 fL   MCH 32.3  26.0 - 34.0 pg   MCHC 33.8  30.0 - 36.0 g/dL   RDW 13.2  11.5 - 15.5 %   Platelets 206  150 - 400 K/uL   Neutrophils Relative % 71  43 - 77 %   Neutro Abs 4.2  1.7 - 7.7 K/uL   Lymphocytes Relative 15  12 - 46 %   Lymphs Abs 0.9  0.7 - 4.0 K/uL    Monocytes Relative 13 (*) 3 - 12 %   Monocytes Absolute 0.8  0.1 - 1.0 K/uL   Eosinophils Relative 1  0 - 5 %   Eosinophils Absolute 0.1  0.0 - 0.7 K/uL   Basophils Relative 0  0 - 1 %   Basophils Absolute 0.0  0.0 - 0.1 K/uL  POTASSIUM      Result Value Range   Potassium 3.7  3.7 - 5.3 mEq/L    Imaging Review Dg Abd Acute W/chest  01/26/2014   CLINICAL DATA:  Fever and diarrhea  EXAM: ACUTE ABDOMEN SERIES (ABDOMEN 2 VIEW & CHEST 1 VIEW)  COMPARISON:  Chest radiograph September 01, 2012  FINDINGS: PA chest: There is no edema or consolidation. Heart size and pulmonary vascularity are normal.  No adenopathy.  Supine and upright abdomen: There is moderate stool in colon. Bowel gas pattern is unremarkable. No obstruction or free air. There is degenerative change in the lumbar spine.  IMPRESSION: Bowel gas pattern unremarkable. Moderate stool. No edema or consolidation.   Electronically Signed   By: Lowella Grip M.D.   On: 01/26/2014 16:55    EKG Interpretation   None       MDM   1. Diarrhea   2. Hypokalemia    Patient symptoms are consistent with a viral enteritis. No significant leukocytosis. But did have marked at low potassium. Patient given 40 mg potassium orally and 10 mEq of potassium x2 IV. Potassium improved up to 3.7. No evidence of any renal insufficiency patient's abdomen is benign. Patient improved with IV fluids. Patient will be discharged home with Imodium and 2 more days of potassium supplements.    Mervin Kung, MD 01/26/14 [phone]

## 2014-01-26 NOTE — ED Notes (Signed)
Pt states "I had a fever the first two days and my bones hurt so bad."

## 2014-02-07 ENCOUNTER — Ambulatory Visit (HOSPITAL_COMMUNITY): Payer: Self-pay | Admitting: Psychology

## 2014-02-19 ENCOUNTER — Ambulatory Visit (INDEPENDENT_AMBULATORY_CARE_PROVIDER_SITE_OTHER): Payer: Medicare Other | Admitting: Psychiatry

## 2014-02-19 ENCOUNTER — Encounter (HOSPITAL_COMMUNITY): Payer: Self-pay | Admitting: Psychiatry

## 2014-02-19 VITALS — BP 118/80 | Ht 62.0 in | Wt 148.0 lb

## 2014-02-19 DIAGNOSIS — F411 Generalized anxiety disorder: Secondary | ICD-10-CM

## 2014-02-19 DIAGNOSIS — F332 Major depressive disorder, recurrent severe without psychotic features: Secondary | ICD-10-CM

## 2014-02-19 MED ORDER — VENLAFAXINE HCL ER 150 MG PO CP24: 300.0000 mg | ORAL_CAPSULE | Freq: Every day | ORAL | Status: DC
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
## 2014-02-27 ENCOUNTER — Other Ambulatory Visit: Payer: Self-pay | Admitting: Pain Medicine

## 2014-02-27 DIAGNOSIS — M545 Low back pain, unspecified: Secondary | ICD-10-CM

## 2014-03-01 ENCOUNTER — Ambulatory Visit (INDEPENDENT_AMBULATORY_CARE_PROVIDER_SITE_OTHER): Payer: Medicare Other | Admitting: Psychology

## 2014-03-01 DIAGNOSIS — F411 Generalized anxiety disorder: Secondary | ICD-10-CM

## 2014-03-03 ENCOUNTER — Ambulatory Visit
Admission: RE | Admit: 2014-03-03 | Discharge: 2014-03-03 | Disposition: A | Discharge status: Home / Self Care | Payer: Medicare Other | Source: Ambulatory Visit | Attending: Pain Medicine | Admitting: Pain Medicine

## 2014-03-03 DIAGNOSIS — M545 Low back pain, unspecified: Secondary | ICD-10-CM

## 2014-03-15 ENCOUNTER — Encounter (HOSPITAL_COMMUNITY): Payer: Self-pay | Admitting: Psychology

## 2014-03-15 ENCOUNTER — Ambulatory Visit (HOSPITAL_COMMUNITY): Payer: Medicare Other | Admitting: Psychology

## 2014-03-20 ENCOUNTER — Encounter (HOSPITAL_COMMUNITY): Payer: Self-pay | Admitting: Psychology
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
## 2014-03-22 ENCOUNTER — Ambulatory Visit (HOSPITAL_COMMUNITY): Payer: Self-pay | Admitting: Psychology

## 2014-03-22 ENCOUNTER — Other Ambulatory Visit (HOSPITAL_COMMUNITY): Payer: Self-pay | Admitting: Psychiatry

## 2014-04-08 ENCOUNTER — Other Ambulatory Visit: Payer: Self-pay | Admitting: Pain Medicine

## 2014-04-08 ENCOUNTER — Ambulatory Visit
Admission: RE | Admit: 2014-04-08 | Discharge: 2014-04-08 | Disposition: A | Discharge status: Home / Self Care | Payer: Medicare Other | Source: Ambulatory Visit | Attending: Pain Medicine | Admitting: Pain Medicine

## 2014-04-08 DIAGNOSIS — M545 Low back pain, unspecified: Secondary | ICD-10-CM

## 2014-04-08 DIAGNOSIS — M79604 Pain in right leg: Secondary | ICD-10-CM

## 2014-04-08 DIAGNOSIS — M79671 Pain in right foot: Secondary | ICD-10-CM

## 2014-04-08 DIAGNOSIS — M25571 Pain in right ankle and joints of right foot: Secondary | ICD-10-CM

## 2014-04-11 ENCOUNTER — Ambulatory Visit (HOSPITAL_COMMUNITY): Payer: Medicare Other | Admitting: Psychology

## 2014-04-19 ENCOUNTER — Encounter (HOSPITAL_COMMUNITY): Payer: Self-pay | Admitting: Psychiatry

## 2014-04-19 ENCOUNTER — Ambulatory Visit (INDEPENDENT_AMBULATORY_CARE_PROVIDER_SITE_OTHER): Payer: Medicare Other | Admitting: Psychiatry

## 2014-04-19 VITALS — BP 130/78 | Ht 62.0 in | Wt 157.0 lb

## 2014-04-19 DIAGNOSIS — F411 Generalized anxiety disorder: Secondary | ICD-10-CM

## 2014-04-19 DIAGNOSIS — F332 Major depressive disorder, recurrent severe without psychotic features: Secondary | ICD-10-CM

## 2014-04-19 MED ORDER — VENLAFAXINE HCL ER 150 MG PO CP24: 300.0000 mg | ORAL_CAPSULE | Freq: Every day | ORAL | Status: DC

## 2014-04-19 MED ORDER — ALPRAZOLAM 2 MG PO TABS: 2.0000 mg | ORAL_TABLET | Freq: Three times a day (TID) | ORAL | Status: DC
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
## 2014-05-03 ENCOUNTER — Encounter (HOSPITAL_COMMUNITY): Payer: Self-pay | Admitting: Psychiatry

## 2014-05-22 ENCOUNTER — Telehealth (HOSPITAL_COMMUNITY): Payer: Self-pay | Admitting: *Deleted

## 2014-07-12 ENCOUNTER — Ambulatory Visit (INDEPENDENT_AMBULATORY_CARE_PROVIDER_SITE_OTHER): Payer: Medicare Other | Admitting: Psychiatry

## 2014-07-12 ENCOUNTER — Encounter (HOSPITAL_COMMUNITY): Payer: Self-pay | Admitting: Psychiatry

## 2014-07-12 VITALS — BP 140/92 | Ht 62.0 in | Wt 148.0 lb

## 2014-07-12 DIAGNOSIS — F411 Generalized anxiety disorder: Secondary | ICD-10-CM

## 2014-07-12 DIAGNOSIS — F332 Major depressive disorder, recurrent severe without psychotic features: Secondary | ICD-10-CM

## 2014-07-12 MED ORDER — VENLAFAXINE HCL ER 150 MG PO CP24: 300.0000 mg | ORAL_CAPSULE | Freq: Every day | ORAL | Status: DC

## 2014-07-12 MED ORDER — ALPRAZOLAM 2 MG PO TABS: 2.0000 mg | ORAL_TABLET | Freq: Three times a day (TID) | ORAL | Status: DC
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
## 2014-07-19 ENCOUNTER — Ambulatory Visit (HOSPITAL_COMMUNITY): Payer: Self-pay | Admitting: Psychiatry

## 2014-09-05 ENCOUNTER — Other Ambulatory Visit (HOSPITAL_COMMUNITY): Payer: Self-pay | Admitting: Psychiatry

## 2014-10-02 ENCOUNTER — Emergency Department (HOSPITAL_COMMUNITY)
Admission: EM | Admit: 2014-10-02 | Discharge: 2014-10-02 | Disposition: A | Discharge status: Home / Self Care | Payer: Medicare Other | Attending: Emergency Medicine | Admitting: Emergency Medicine

## 2014-10-02 ENCOUNTER — Emergency Department (HOSPITAL_COMMUNITY): Payer: Medicare Other

## 2014-10-02 ENCOUNTER — Encounter (HOSPITAL_COMMUNITY): Payer: Self-pay | Admitting: Emergency Medicine

## 2014-10-02 DIAGNOSIS — Z86011 Personal history of benign neoplasm of the brain: Secondary | ICD-10-CM | POA: Insufficient documentation

## 2014-10-02 DIAGNOSIS — K219 Gastro-esophageal reflux disease without esophagitis: Secondary | ICD-10-CM | POA: Diagnosis not present

## 2014-10-02 DIAGNOSIS — Z8739 Personal history of other diseases of the musculoskeletal system and connective tissue: Secondary | ICD-10-CM | POA: Diagnosis not present

## 2014-10-02 DIAGNOSIS — Z760 Encounter for issue of repeat prescription: Secondary | ICD-10-CM | POA: Diagnosis present

## 2014-10-02 DIAGNOSIS — Z8701 Personal history of pneumonia (recurrent): Secondary | ICD-10-CM | POA: Diagnosis not present

## 2014-10-02 DIAGNOSIS — Z419 Encounter for procedure for purposes other than remedying health state, unspecified: Secondary | ICD-10-CM | POA: Diagnosis not present

## 2014-10-02 DIAGNOSIS — R0789 Other chest pain: Secondary | ICD-10-CM | POA: Diagnosis not present

## 2014-10-02 DIAGNOSIS — I10 Essential (primary) hypertension: Secondary | ICD-10-CM | POA: Insufficient documentation

## 2014-10-02 DIAGNOSIS — F329 Major depressive disorder, single episode, unspecified: Secondary | ICD-10-CM | POA: Diagnosis not present

## 2014-10-02 DIAGNOSIS — Z8619 Personal history of other infectious and parasitic diseases: Secondary | ICD-10-CM | POA: Diagnosis not present

## 2014-10-02 DIAGNOSIS — Z8669 Personal history of other diseases of the nervous system and sense organs: Secondary | ICD-10-CM | POA: Diagnosis not present

## 2014-10-02 DIAGNOSIS — Z8709 Personal history of other diseases of the respiratory system: Secondary | ICD-10-CM | POA: Diagnosis not present

## 2014-10-02 DIAGNOSIS — Z86010 Personal history of colonic polyps: Secondary | ICD-10-CM | POA: Insufficient documentation

## 2014-10-02 DIAGNOSIS — G8929 Other chronic pain: Secondary | ICD-10-CM | POA: Diagnosis not present

## 2014-10-02 MED ORDER — OXYCODONE-ACETAMINOPHEN 5-325 MG PO TABS: 1.0000 | ORAL_TABLET | ORAL | Status: DC | PRN

## 2014-10-02 MED ORDER — OXYCODONE-ACETAMINOPHEN 5-325 MG PO TABS
2.0000 | ORAL_TABLET | Freq: Once | ORAL | Status: AC
  Administered 2014-10-02: 2 via ORAL
  Filled 2014-10-02: qty 2

## 2014-10-02 NOTE — ED Provider Notes (Signed)
CSN: [id number]     Arrival date & time 10/02/14  1025 History   First MD Initiated Contact with Patient 10/02/14 1102     Chief Complaint  Patient presents with  . pain medication refill      (Consider location/radiation/quality/duration/timing/severity/associated sxs/prior Treatment) The history is provided by the patient.   JERALD VILLALONA is a 63 y.o. female presenting with acute left anterior chest wall pain and chronic neck, and back pain.  She was sitting up from a supine position when waking this morning and felt a popping sensation along her left anterior chest wall, the site of previous old rib fracture. She has a history of osteoporosis.  She denies shortness of breath, cough, nausea, vomiting.  She is also here for chronic pain management.  She was in chronic pain management with a group in [place] but was recently let go as they discovered she still had an open workers comp claim from work related injury over 10 years ago.  States they do not work with workers comp so is unable to continue seeing here.  She was not aware workers comp was still pending as she is currently on disability. She ran out of her opana and her oxycodone yesterday.  Her pcp will not fill her pain medicines and she was told to come here.     Past Medical History  Diagnosis Date  . Disc degeneration   . Hyperlipemia     takes Pravastatin daily  . Brain tumor (benign)   . Pinched nerve in shoulder   . Degenerative disc disease   . Hypertension     takes Lotrel daily  . Depression     takes Effexor daily  . GERD (gastroesophageal reflux disease)     takes Omeprazole daily  . Anxiety     takes Xanax daily  . History of bronchitis     uses inhaler daily  . Pneumonia 2012  . Numbness in both hands   . Joint pain   . Chronic back pain     bulding disc  . Osteoporosis     Fosamax weekly  . Hepatitis     Hep C  . History of colon polyps   . History of staph infection    Past Surgical  History  Procedure Laterality Date  . Knee surgery Right   . Facial fracture surgery      in the late 90's  . Ankle fracture surgery Bilateral early 2000's  . Tubal ligation    . Colonoscopy    . Orif calcaneous fracture Right 07/13/2013    Procedure: RIGHT OPEN REDUCTION INTERNAL FIXATION (ORIF) CALCANEUS FRACTURE;  Surgeon: Newt Minion, MD;  Location: [facility];  Service: Orthopedics;  Laterality: Right;   Family History  Problem Relation Age of Onset  . Bipolar disorder Daughter   . Drug abuse Son   . Drug abuse Grandchild   . Drug abuse Grandchild    History  Substance Use Topics  . Smoking status: Current Every Day Smoker -- 0.50 packs/day for 40 years    Types: Cigarettes  . Smokeless tobacco: Not on file  . Alcohol Use: No   OB History   Grav Para Term Preterm Abortions TAB SAB Ect Mult Living                 Review of Systems  Constitutional: Negative for fever and chills.  HENT: Negative for congestion and sore throat.   Eyes: Negative.   Respiratory: Negative  for chest tightness and shortness of breath.   Cardiovascular: Positive for chest pain.  Gastrointestinal: Negative for nausea and abdominal pain.  Genitourinary: Negative.   Musculoskeletal: Negative for arthralgias, joint swelling and neck pain.  Skin: Negative.  Negative for rash and wound.  Neurological: Negative for dizziness, weakness, light-headedness, numbness and headaches.  Psychiatric/Behavioral: Negative.       Allergies  Review of patient's allergies indicates no known allergies.  Home Medications   Prior to Admission medications   Medication Sig Start Date End Date Taking? Authorizing Provider  albuterol (PROVENTIL HFA;VENTOLIN HFA) 108 (90 BASE) MCG/ACT inhaler Inhale 2 puffs into the lungs every 6 (six) hours as needed for wheezing.   Yes Historical Provider, MD  alendronate (FOSAMAX) 70 MG tablet Take 70 mg by mouth every 7 (seven) days. On mondays. Take with a full glass of water on  an empty stomach.    Yes Historical Provider, MD  alprazolam Duanne Moron) 2 MG tablet Take 1 tablet (2 mg total) by mouth 3 (three) times daily. 07/12/14  Yes Levonne Spiller, MD  amLODipine-benazepril (LOTREL) 5-10 MG per capsule Take 1 capsule by mouth daily.   Yes Historical Provider, MD  aspirin-acetaminophen-caffeine (EXCEDRIN MIGRAINE) [phone] MG per tablet Take 1 tablet by mouth every 6 (six) hours as needed for pain.   Yes Historical Provider, MD  cholecalciferol (VITAMIN D) 1000 UNITS tablet Take 1,000 Units by mouth daily.   Yes Historical Provider, MD  gabapentin (NEURONTIN) 800 MG tablet Take 800 mg by mouth 4 (four) times daily.   Yes Historical Provider, MD  Nyoka Cowden Tea, Camillia sinensis, (GREEN TEA PO) Take 1 tablet by mouth daily.   Yes Historical Provider, MD  omeprazole (PRILOSEC) 20 MG capsule Take 20 mg by mouth daily.   Yes Historical Provider, MD  OPANA ER, CRUSH RESISTANT, 10 MG T12A 12 hr tablet Take 1 tablet by mouth 2 (two) times daily. 09/02/14  Yes Historical Provider, MD  oxyCODONE-acetaminophen (PERCOCET) 10-325 MG per tablet Take 1 tablet by mouth every 4 (four) hours as needed for pain.   Yes Historical Provider, MD  pravastatin (PRAVACHOL) 20 MG tablet Take 20 mg by mouth daily.   Yes Historical Provider, MD  venlafaxine XR (EFFEXOR-XR) 150 MG 24 hr capsule Take 2 capsules (300 mg total) by mouth daily. 07/12/14  Yes Levonne Spiller, MD  oxyCODONE-acetaminophen (PERCOCET/ROXICET) 5-325 MG per tablet Take 1 tablet by mouth every 4 (four) hours as needed. 10/02/14   Evalee Jefferson, PA-C   BP 128/91  Pulse 89  Temp(Src) 99.1 F (37.3 C) (Oral)  Resp 20  Ht 5\' 2"  (1.575 m)  Wt 150 lb (68.04 kg)  BMI 27.43 kg/m2  SpO2 99% Physical Exam  Nursing note and vitals reviewed. Constitutional: She appears well-developed and well-nourished.  HENT:  Head: Normocephalic and atraumatic.  Eyes: Conjunctivae are normal.  Neck: Normal range of motion.  Cardiovascular: Normal rate, regular  rhythm, normal heart sounds and intact distal pulses.   Pulmonary/Chest: Effort normal and breath sounds normal. She has no wheezes. She exhibits tenderness.    Tender to palpation along left lateral lower anterior ribs without palpable deformity, flail chest, swelling or bruising.  Abdomen soft and nontender  Abdominal: Soft. Bowel sounds are normal. There is no tenderness.  Musculoskeletal: Normal range of motion.  Neurological: She is alert.  Skin: Skin is warm and dry.  Psychiatric: She has a normal mood and affect.    ED Course  Procedures (including critical care time) Labs Review  Labs Reviewed - No data to display  Imaging Review Dg Ribs Unilateral W/chest Left  10/02/2014   CLINICAL DATA:  63 year old female with acute onset left lower rib pain this morning, without blunt trauma. Initial encounter.  EXAM: LEFT RIBS AND CHEST - 3+ VIEW  COMPARISON:  Chest radiographs 06/01/2014 and earlier.  FINDINGS: Stable lung volumes. Stable lung parenchyma. No pneumothorax, pleural effusion, or acute pulmonary opacity. Normal cardiac size and mediastinal contours. Visualized tracheal air column is within normal limits.  However, there is a conspicuous density again noted projecting at the junction of the right anterior seventh and posterior tenth ribs. Stable since June but new prior to that.  Bone mineralization is within normal limits. There is suggestion of healed fracture along the left lateral eighth rib, which is new since February but seems to be stable since June. No acute displaced rib fracture identified. Other visible osseous structures appear intact.  IMPRESSION: 1. No acute displaced left rib fracture identified. Suspect chronic left lateral eighth rib fracture. 2. Probable summation artifact right lower lung, but also present in June. Recommend Repeat PA and lateral chest x-ray in 8 weeks with attention to this area, or alternatively followup with chest CT to exclude pulmonary nodule.    Electronically Signed   By: Lars Pinks M.D.   On: 10/02/2014 12:36     EKG Interpretation None      MDM   Final diagnoses:  Chest wall pain  Chronic pain    Patients labs and/or radiological studies were viewed and considered during the medical decision making and disposition process. Discussed pulmonary nodule, states she was told this before, but forgot about f/u.  Advised she needs f/u with her pcp for this problem and will need repeat imaging in 8 weeks.  She was given a small quantity of oxycodone for acute rib and chronic back pain.  Advised heat tx to chest wall, further management of chronic pain issues by pcp.  Pt stated her pcp will not prescribe her narcotic medicines.  Advised patient that the ed is also not the place for chronic pain management and she needs to obtain other f/u care for this and will need to stretch todays prescription.  She states her pcp is referring her back to a new chronic pain clinic in [place] which she is not happy driving that distance.  Suggested asking her pcp about referral to Dr Francesco Runner.    Patients labs and/or radiological studies were viewed and considered during the medical decision making and disposition process.  The patient appears reasonably screened and/or stabilized for discharge and I doubt any other medical condition or other [facility] requiring further screening, evaluation, or treatment in the ED at this time prior to discharge.     Evalee Jefferson, PA-C 10/02/14 [phone]

## 2014-10-02 NOTE — Discharge Instructions (Signed)
Chest Wall Pain Chest wall pain is pain felt in or around the chest bones and muscles. It may take up to 6 weeks to get better. It may take longer if you are active. Chest wall pain can happen on its own. Other times, things like germs, injury, coughing, or exercise can cause the pain. HOME CARE   Avoid activities that make you tired or cause pain. Try not to use your chest, belly (abdominal), or side muscles. Do not use heavy weights.  Put ice on the sore area.  Put ice in a plastic bag.  Place a towel between your skin and the bag.  Leave the ice on for 15-20 minutes for the first 2 days.  Only take medicine as told by your doctor. GET HELP RIGHT AWAY IF:   You have more pain or are very uncomfortable.  You have a fever.  Your chest pain gets worse.  You have new problems.  You feel sick to your stomach (nauseous) or throw up (vomit).  You start to sweat or feel lightheaded.  You have a cough with mucus (phlegm).  You cough up blood. MAKE SURE YOU:   Understand these instructions.  Will watch your condition.  Will get help right away if you are not doing well or get worse. Document Released: 05/24/2008 Document Revised: 02/28/2012 Document Reviewed: 08/02/2011 [facility] Patient Information 2015 [place], [place]. This information is not intended to replace advice given to you by your health care provider. Make sure you discuss any questions you have with your health care provider.  Chronic Pain Chronic pain can be defined as pain that is off and on and lasts for 3-6 months or longer. Many things cause chronic pain, which can make it difficult to make a diagnosis. There are many treatment options available for chronic pain. However, finding a treatment that works well for you may require trying various approaches until the right one is found. Many people benefit from a combination of two or more types of treatment to control their pain. SYMPTOMS  Chronic pain can occur  anywhere in the body and can range from mild to very severe. Some types of chronic pain include:  Headache.  Low back pain.  Cancer pain.  Arthritis pain.  Neurogenic pain. This is pain resulting from damage to nerves. People with chronic pain may also have other symptoms such as:  Depression.  Anger.  Insomnia.  Anxiety. DIAGNOSIS  Your health care provider will help diagnose your condition over time. In many cases, the initial focus will be on excluding possible conditions that could be causing the pain. Depending on your symptoms, your health care provider may order tests to diagnose your condition. Some of these tests may include:   Blood tests.   CT scan.   MRI.   X-rays.   Ultrasounds.   Nerve conduction studies.  You may need to see a specialist.  TREATMENT  Finding treatment that works well may take time. You may be referred to a pain specialist. He or she may prescribe medicine or therapies, such as:   Mindful meditation or yoga.  Shots (injections) of numbing or pain-relieving medicines into the spine or area of pain.  Local electrical stimulation.  Acupuncture.   Massage therapy.   Aroma, color, light, or sound therapy.   Biofeedback.   Working with a physical therapist to keep from getting stiff.   Regular, gentle exercise.   Cognitive or behavioral therapy.   Group support.  Sometimes, surgery may  be recommended.  HOME CARE INSTRUCTIONS   Take all medicines as directed by your health care provider.   Lessen stress in your life by relaxing and doing things such as listening to calming music.   Exercise or be active as directed by your health care provider.   Eat a healthy diet and include things such as vegetables, fruits, fish, and lean meats in your diet.   Keep all follow-up appointments with your health care provider.   Attend a support group with others suffering from chronic pain. SEEK MEDICAL CARE IF:    Your pain gets worse.   You develop a new pain that was not there before.   You cannot tolerate medicines given to you by your health care provider.   You have new symptoms since your last visit with your health care provider.  SEEK IMMEDIATE MEDICAL CARE IF:   You feel weak.   You have decreased sensation or numbness.   You lose control of bowel or bladder function.   Your pain suddenly gets much worse.   You develop shaking.  You develop chills.  You develop confusion.  You develop chest pain.  You develop shortness of breath.  MAKE SURE YOU:  Understand these instructions.  Will watch your condition.  Will get help right away if you are not doing well or get worse. Document Released: 08/28/2002 Document Revised: 08/08/2013 Document Reviewed: 06/01/2013 [facility] Patient Information 2015 [place], [place]. This information is not intended to replace advice given to you by your health care provider. Make sure you discuss any questions you have with your health care provider.   Your x-rays are negative for any acute rib fracture, but it does appear you have an old chronic rib fracture on this side.  Additionally your chest x-ray shows that you may have a nodule in your right lung field which requires a repeat chest x-ray in 8 weeks to see if it persists.  Please call your primary Dr. for an office visit to discuss this who can arrange for repeat x-ray.  The emergency department cannot continue to fill your narcotic pain medications .  You will need to have your primary doctor or your new chronic pain specialist do this for you.

## 2014-10-02 NOTE — ED Notes (Signed)
nad noted prior to dc. Dc instructions reviewed and f/u instructed for chest xray. Pt voiced understanding. 1 Rx given to pt.

## 2014-10-02 NOTE — ED Notes (Signed)
Pt reports is "inbetween" pain clinics and took her last dose of opana and percocet 10mg  yesterday.  Reports yesterday heard her ribs crack when she stood up.

## 2014-10-03 NOTE — ED Provider Notes (Signed)
Medical screening examination/treatment/procedure(s) were performed by non-physician practitioner and as supervising physician I was immediately available for consultation/collaboration.   EKG Interpretation None        Francine Graven, DO 10/03/14 [phone]

## 2014-10-10 ENCOUNTER — Encounter (HOSPITAL_COMMUNITY): Payer: Self-pay | Admitting: Psychiatry

## 2014-10-10 ENCOUNTER — Ambulatory Visit (INDEPENDENT_AMBULATORY_CARE_PROVIDER_SITE_OTHER): Payer: Medicare Other | Admitting: Psychiatry

## 2014-10-10 VITALS — BP 151/95 | HR 72 | Ht 62.0 in | Wt 149.2 lb

## 2014-10-10 DIAGNOSIS — F411 Generalized anxiety disorder: Secondary | ICD-10-CM

## 2014-10-10 MED ORDER — ALPRAZOLAM 2 MG PO TABS: 2.0000 mg | ORAL_TABLET | Freq: Three times a day (TID) | ORAL | Status: DC

## 2014-10-10 MED ORDER — VENLAFAXINE HCL ER 150 MG PO CP24: 300.0000 mg | ORAL_CAPSULE | Freq: Every day | ORAL | Status: DC
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
## 2014-10-18 ENCOUNTER — Emergency Department (HOSPITAL_COMMUNITY)
Admission: EM | Admit: 2014-10-18 | Discharge: 2014-10-18 | Disposition: A | Discharge status: Home / Self Care | Payer: Medicare Other | Attending: Emergency Medicine | Admitting: Emergency Medicine

## 2014-10-18 ENCOUNTER — Encounter (HOSPITAL_COMMUNITY): Payer: Self-pay | Admitting: Emergency Medicine

## 2014-10-18 DIAGNOSIS — F329 Major depressive disorder, single episode, unspecified: Secondary | ICD-10-CM | POA: Insufficient documentation

## 2014-10-18 DIAGNOSIS — Z720 Tobacco use: Secondary | ICD-10-CM | POA: Diagnosis not present

## 2014-10-18 DIAGNOSIS — G8929 Other chronic pain: Secondary | ICD-10-CM | POA: Insufficient documentation

## 2014-10-18 DIAGNOSIS — R0789 Other chest pain: Secondary | ICD-10-CM | POA: Diagnosis not present

## 2014-10-18 DIAGNOSIS — Z8619 Personal history of other infectious and parasitic diseases: Secondary | ICD-10-CM | POA: Insufficient documentation

## 2014-10-18 DIAGNOSIS — R079 Chest pain, unspecified: Secondary | ICD-10-CM | POA: Diagnosis present

## 2014-10-18 DIAGNOSIS — Z8701 Personal history of pneumonia (recurrent): Secondary | ICD-10-CM | POA: Diagnosis not present

## 2014-10-18 DIAGNOSIS — F419 Anxiety disorder, unspecified: Secondary | ICD-10-CM | POA: Diagnosis not present

## 2014-10-18 DIAGNOSIS — I10 Essential (primary) hypertension: Secondary | ICD-10-CM | POA: Diagnosis not present

## 2014-10-18 DIAGNOSIS — Z79899 Other long term (current) drug therapy: Secondary | ICD-10-CM | POA: Diagnosis not present

## 2014-10-18 DIAGNOSIS — E785 Hyperlipidemia, unspecified: Secondary | ICD-10-CM | POA: Insufficient documentation

## 2014-10-18 DIAGNOSIS — Z86010 Personal history of colonic polyps: Secondary | ICD-10-CM | POA: Diagnosis not present

## 2014-10-18 DIAGNOSIS — M545 Low back pain: Secondary | ICD-10-CM | POA: Insufficient documentation

## 2014-10-18 DIAGNOSIS — K219 Gastro-esophageal reflux disease without esophagitis: Secondary | ICD-10-CM | POA: Diagnosis not present

## 2014-10-18 DIAGNOSIS — Z8709 Personal history of other diseases of the respiratory system: Secondary | ICD-10-CM | POA: Diagnosis not present

## 2014-10-18 MED ORDER — TRAMADOL HCL 50 MG PO TABS: 50.0000 mg | ORAL_TABLET | Freq: Four times a day (QID) | ORAL | Status: DC | PRN

## 2014-10-18 NOTE — ED Provider Notes (Signed)
CSN: [id number]     Arrival date & time 10/18/14  1123 History   First MD Initiated Contact with Patient 10/18/14 1137     Chief Complaint  Patient presents with  . Rib Injury     (Consider location/radiation/quality/duration/timing/severity/associated sxs/prior Treatment) HPI Comments: Pt comes in today with left rib pain and back pain. Pt states that the back pain in a chronic problem. No recent fall or injury. Pt states that the left right pain started after turning the wrong way a couple of weeks ago. She states that she was seen here and had an xray. She took pain medication from here that helped but now she is out. She states that her pcp won't give pain medication which is why she is back here today. Denies numbness or weakness  The history is provided by the patient. No language interpreter was used.    Past Medical History  Diagnosis Date  . Disc degeneration   . Hyperlipemia     takes Pravastatin daily  . Brain tumor (benign)   . Pinched nerve in shoulder   . Degenerative disc disease   . Hypertension     takes Lotrel daily  . Depression     takes Effexor daily  . GERD (gastroesophageal reflux disease)     takes Omeprazole daily  . Anxiety     takes Xanax daily  . History of bronchitis     uses inhaler daily  . Pneumonia 2012  . Numbness in both hands   . Joint pain   . Chronic back pain     bulding disc  . Osteoporosis     Fosamax weekly  . Hepatitis     Hep C  . History of colon polyps   . History of staph infection    Past Surgical History  Procedure Laterality Date  . Knee surgery Right   . Facial fracture surgery      in the late 90's  . Ankle fracture surgery Bilateral early 2000's  . Tubal ligation    . Colonoscopy    . Orif calcaneous fracture Right 07/13/2013    Procedure: RIGHT OPEN REDUCTION INTERNAL FIXATION (ORIF) CALCANEUS FRACTURE;  Surgeon: Newt Minion, MD;  Location: [facility];  Service: Orthopedics;  Laterality: Right;   Family History   Problem Relation Age of Onset  . Bipolar disorder Daughter   . Drug abuse Son   . Drug abuse Grandchild   . Drug abuse Grandchild    History  Substance Use Topics  . Smoking status: Current Every Day Smoker -- 0.50 packs/day for 40 years    Types: Cigarettes  . Smokeless tobacco: Not on file  . Alcohol Use: No   OB History   Grav Para Term Preterm Abortions TAB SAB Ect Mult Living                 Review of Systems  All other systems reviewed and are negative.     Allergies  Review of patient's allergies indicates no known allergies.  Home Medications   Prior to Admission medications   Medication Sig Start Date End Date Taking? Authorizing Provider  albuterol (PROVENTIL HFA;VENTOLIN HFA) 108 (90 BASE) MCG/ACT inhaler Inhale 2 puffs into the lungs every 6 (six) hours as needed for wheezing.    Historical Provider, MD  alendronate (FOSAMAX) 70 MG tablet Take 70 mg by mouth every 7 (seven) days. On mondays. Take with a full glass of water on an empty stomach.  Historical Provider, MD  alprazolam Duanne Moron) 2 MG tablet Take 1 tablet (2 mg total) by mouth 3 (three) times daily. 10/10/14   Levonne Spiller, MD  amLODipine-benazepril (LOTREL) 5-10 MG per capsule Take 1 capsule by mouth daily.    Historical Provider, MD  aspirin-acetaminophen-caffeine (EXCEDRIN MIGRAINE) [id number] MG per tablet Take 1 tablet by mouth every 6 (six) hours as needed for pain.    Historical Provider, MD  cholecalciferol (VITAMIN D) 1000 UNITS tablet Take 1,000 Units by mouth daily.    Historical Provider, MD  gabapentin (NEURONTIN) 800 MG tablet Take 800 mg by mouth 4 (four) times daily.    Historical Provider, MD  Nyoka Cowden Tea, Camillia sinensis, (GREEN TEA PO) Take 1 tablet by mouth daily.    Historical Provider, MD  omeprazole (PRILOSEC) 20 MG capsule Take 20 mg by mouth daily.    Historical Provider, MD  OPANA ER, CRUSH RESISTANT, 10 MG T12A 12 hr tablet Take 1 tablet by mouth 2 (two) times daily. 09/02/14    Historical Provider, MD  oxyCODONE-acetaminophen (PERCOCET) 10-325 MG per tablet Take 1 tablet by mouth every 4 (four) hours as needed for pain.    Historical Provider, MD  pravastatin (PRAVACHOL) 20 MG tablet Take 20 mg by mouth daily.    Historical Provider, MD  traMADol (ULTRAM) 50 MG tablet Take 1 tablet (50 mg total) by mouth every 6 (six) hours as needed. 10/18/14   Glendell Docker, NP  venlafaxine XR (EFFEXOR-XR) 150 MG 24 hr capsule Take 2 capsules (300 mg total) by mouth daily. 10/10/14   Levonne Spiller, MD   BP 116/96  Pulse 78  Temp(Src) 98.1 F (36.7 C) (Oral)  Resp 16  Ht 5\' 2"  (1.575 m)  Wt 150 lb (68.04 kg)  BMI 27.43 kg/m2  SpO2 100% Physical Exam  Nursing note and vitals reviewed. Constitutional: She is oriented to person, place, and time. She appears well-developed and well-nourished.  Cardiovascular: Normal rate and regular rhythm.   Pulmonary/Chest: Effort normal and breath sounds normal.  Musculoskeletal: Normal range of motion.  Left lateral rib pain. Generalized tenderness with palpation to back. Has full rom  Neurological: She is alert and oriented to person, place, and time. She exhibits normal muscle tone. Coordination normal.  Skin: Skin is warm and dry. No rash noted.    ED Course  Procedures (including critical care time) Labs Review Labs Reviewed - No data to display  Imaging Review No results found.   EKG Interpretation None      MDM   Final diagnoses:  Chest wall pain  Low back pain without sciatica, unspecified back pain laterality    Pt not happy with ultram. However we told her that she this is a chronic problem that she need to see pain management for if it continues   Glendell Docker, NP 10/18/14 1353

## 2014-10-18 NOTE — Discharge Instructions (Signed)
You need to follow up with pain management if you continue to have the symptoms Chronic Back Pain  When back pain lasts longer than 3 months, it is called chronic back pain.People with chronic back pain often go through certain periods that are more intense (flare-ups).  CAUSES Chronic back pain can be caused by wear and tear (degeneration) on different structures in your back. These structures include:  The bones of your spine (vertebrae) and the joints surrounding your spinal cord and nerve roots (facets).  The strong, fibrous tissues that connect your vertebrae (ligaments). Degeneration of these structures may result in pressure on your nerves. This can lead to constant pain. HOME CARE INSTRUCTIONS  Avoid bending, heavy lifting, prolonged sitting, and activities which make the problem worse.  Take brief periods of rest throughout the day to reduce your pain. Lying down or standing usually is better than sitting while you are resting.  Take over-the-counter or prescription medicines only as directed by your caregiver. SEEK IMMEDIATE MEDICAL CARE IF:   You have weakness or numbness in one of your legs or feet.  You have trouble controlling your bladder or bowels.  You have nausea, vomiting, abdominal pain, shortness of breath, or fainting. Document Released: 01/13/2005 Document Revised: 02/28/2012 Document Reviewed: 11/20/2011 [facility] Patient Information 2015 [place], [place]. This information is not intended to replace advice given to you by your health care provider. Make sure you discuss any questions you have with your health care provider.

## 2014-10-18 NOTE — ED Notes (Signed)
Pt reports seen for same in the past. Pt reports continued pain for last several weeks. nad noted. Pt denies any cp or sob.pt reports increased pain with movement.

## 2014-10-18 NOTE — ED Notes (Signed)
Pt states at discharge that the tramadol does not help with her pain, V. Pickering NP informed and stated she can not give pt any other med.  Pt notified of NP reply and is asking for something for pain now, said nurse stated pt can get prescription filled and take med when filled.

## 2014-10-19 NOTE — ED Provider Notes (Signed)
Medical screening examination/treatment/procedure(s) were performed by non-physician practitioner and as supervising physician I was immediately available for consultation/collaboration.   EKG Interpretation None       Nat Christen, MD 10/19/14 1044

## 2014-12-24 ENCOUNTER — Encounter (HOSPITAL_COMMUNITY): Payer: Self-pay | Admitting: Emergency Medicine

## 2014-12-24 ENCOUNTER — Emergency Department (HOSPITAL_COMMUNITY)
Admission: EM | Admit: 2014-12-24 | Discharge: 2014-12-24 | Disposition: A | Discharge status: Home / Self Care | Payer: Medicare Other | Attending: Emergency Medicine | Admitting: Emergency Medicine

## 2014-12-24 DIAGNOSIS — M5440 Lumbago with sciatica, unspecified side: Secondary | ICD-10-CM | POA: Insufficient documentation

## 2014-12-24 DIAGNOSIS — I10 Essential (primary) hypertension: Secondary | ICD-10-CM | POA: Insufficient documentation

## 2014-12-24 DIAGNOSIS — K219 Gastro-esophageal reflux disease without esophagitis: Secondary | ICD-10-CM | POA: Diagnosis not present

## 2014-12-24 DIAGNOSIS — Z720 Tobacco use: Secondary | ICD-10-CM | POA: Diagnosis not present

## 2014-12-24 DIAGNOSIS — Z7982 Long term (current) use of aspirin: Secondary | ICD-10-CM | POA: Insufficient documentation

## 2014-12-24 DIAGNOSIS — M545 Low back pain: Secondary | ICD-10-CM | POA: Diagnosis present

## 2014-12-24 DIAGNOSIS — F329 Major depressive disorder, single episode, unspecified: Secondary | ICD-10-CM | POA: Diagnosis not present

## 2014-12-24 DIAGNOSIS — E785 Hyperlipidemia, unspecified: Secondary | ICD-10-CM | POA: Diagnosis not present

## 2014-12-24 DIAGNOSIS — F419 Anxiety disorder, unspecified: Secondary | ICD-10-CM | POA: Insufficient documentation

## 2014-12-24 DIAGNOSIS — Z8701 Personal history of pneumonia (recurrent): Secondary | ICD-10-CM | POA: Insufficient documentation

## 2014-12-24 DIAGNOSIS — G8929 Other chronic pain: Secondary | ICD-10-CM | POA: Diagnosis not present

## 2014-12-24 DIAGNOSIS — Z79899 Other long term (current) drug therapy: Secondary | ICD-10-CM | POA: Diagnosis not present

## 2014-12-24 DIAGNOSIS — Z8619 Personal history of other infectious and parasitic diseases: Secondary | ICD-10-CM | POA: Insufficient documentation

## 2014-12-24 DIAGNOSIS — Z86011 Personal history of benign neoplasm of the brain: Secondary | ICD-10-CM | POA: Insufficient documentation

## 2014-12-24 MED ORDER — ACETAMINOPHEN-CODEINE #3 300-30 MG PO TABS: 1.0000 | ORAL_TABLET | Freq: Four times a day (QID) | ORAL | Status: DC | PRN

## 2014-12-24 MED ORDER — METHOCARBAMOL 500 MG PO TABS: 500.0000 mg | ORAL_TABLET | Freq: Three times a day (TID) | ORAL | Status: DC

## 2014-12-24 NOTE — Discharge Instructions (Signed)

## 2014-12-24 NOTE — ED Provider Notes (Signed)
CSN: [id number]     Arrival date & time 12/24/14  1612 History   First MD Initiated Contact with Patient 12/24/14 1640     Chief Complaint  Patient presents with  . Back Pain     (Consider location/radiation/quality/duration/timing/severity/associated sxs/prior Treatment) HPI Comments: Patient is 64 year old female who presents to the emergency department with complaint of lower back pain. The patient states that she has a problem with degenerative disc disease of her back, and 2 weeks ago she fell and injured her back again. She did not hit her head. There was no loss of consciousness. Since the time of the fall there has been no loss of control of bowel or bladder. Patient denies any blood in her urine since that time. She states however that she seems to be getting more and more stiff and having more and more soreness in her back and lower back area. She has tried Tylenol without improvement.  The history is provided by the patient.    Past Medical History  Diagnosis Date  . Disc degeneration   . Hyperlipemia     takes Pravastatin daily  . Brain tumor (benign)   . Pinched nerve in shoulder   . Degenerative disc disease   . Hypertension     takes Lotrel daily  . Depression     takes Effexor daily  . GERD (gastroesophageal reflux disease)     takes Omeprazole daily  . Anxiety     takes Xanax daily  . History of bronchitis     uses inhaler daily  . Pneumonia 2012  . Numbness in both hands   . Joint pain   . Chronic back pain     bulding disc  . Osteoporosis     Fosamax weekly  . Hepatitis     Hep C  . History of colon polyps   . History of staph infection    Past Surgical History  Procedure Laterality Date  . Knee surgery Right   . Facial fracture surgery      in the late 90's  . Ankle fracture surgery Bilateral early 2000's  . Tubal ligation    . Colonoscopy    . Orif calcaneous fracture Right 07/13/2013    Procedure: RIGHT OPEN REDUCTION INTERNAL FIXATION (ORIF)  CALCANEUS FRACTURE;  Surgeon: Newt Minion, MD;  Location: [facility];  Service: Orthopedics;  Laterality: Right;   Family History  Problem Relation Age of Onset  . Bipolar disorder Daughter   . Drug abuse Son   . Drug abuse Grandchild   . Drug abuse Grandchild    History  Substance Use Topics  . Smoking status: Current Every Day Smoker -- 0.50 packs/day for 40 years    Types: Cigarettes  . Smokeless tobacco: Never Used  . Alcohol Use: No   OB History    Gravida Para Term Preterm AB TAB SAB Ectopic Multiple Living   3 2 2  1  1   2      Review of Systems  Constitutional: Negative for activity change.       All ROS Neg except as noted in HPI  HENT: Negative for nosebleeds.   Eyes: Negative for photophobia and discharge.  Respiratory: Negative for cough, shortness of breath and wheezing.   Cardiovascular: Negative for chest pain and palpitations.  Gastrointestinal: Negative for abdominal pain and blood in stool.  Genitourinary: Negative for dysuria, frequency and hematuria.  Musculoskeletal: Positive for back pain. Negative for arthralgias and neck pain.  Skin: Negative.   Neurological: Negative for dizziness, seizures and speech difficulty.  Psychiatric/Behavioral: Negative for hallucinations and confusion.       Depression      Allergies  Review of patient's allergies indicates no known allergies.  Home Medications   Prior to Admission medications   Medication Sig Start Date End Date Taking? Authorizing Provider  albuterol (PROVENTIL HFA;VENTOLIN HFA) 108 (90 BASE) MCG/ACT inhaler Inhale 2 puffs into the lungs every 6 (six) hours as needed for wheezing.    Historical Provider, MD  alendronate (FOSAMAX) 70 MG tablet Take 70 mg by mouth every 7 (seven) days. On mondays. Take with a full glass of water on an empty stomach.     Historical Provider, MD  alprazolam Duanne Moron) 2 MG tablet Take 1 tablet (2 mg total) by mouth 3 (three) times daily. 10/10/14   Levonne Spiller, MD   amLODipine-benazepril (LOTREL) 5-10 MG per capsule Take 1 capsule by mouth daily.    Historical Provider, MD  aspirin-acetaminophen-caffeine (EXCEDRIN MIGRAINE) [phone] MG per tablet Take 1 tablet by mouth every 6 (six) hours as needed for pain.    Historical Provider, MD  cholecalciferol (VITAMIN D) 1000 UNITS tablet Take 1,000 Units by mouth daily.    Historical Provider, MD  gabapentin (NEURONTIN) 800 MG tablet Take 800 mg by mouth 4 (four) times daily.    Historical Provider, MD  Nyoka Cowden Tea, Camillia sinensis, (GREEN TEA PO) Take 1 tablet by mouth daily.    Historical Provider, MD  omeprazole (PRILOSEC) 20 MG capsule Take 20 mg by mouth daily.    Historical Provider, MD  OPANA ER, CRUSH RESISTANT, 10 MG T12A 12 hr tablet Take 1 tablet by mouth 2 (two) times daily. 09/02/14   Historical Provider, MD  oxyCODONE-acetaminophen (PERCOCET) 10-325 MG per tablet Take 1 tablet by mouth every 4 (four) hours as needed for pain.    Historical Provider, MD  pravastatin (PRAVACHOL) 20 MG tablet Take 20 mg by mouth daily.    Historical Provider, MD  traMADol (ULTRAM) 50 MG tablet Take 1 tablet (50 mg total) by mouth every 6 (six) hours as needed. 10/18/14   Glendell Docker, NP  venlafaxine XR (EFFEXOR-XR) 150 MG 24 hr capsule Take 2 capsules (300 mg total) by mouth daily. 10/10/14   Levonne Spiller, MD   BP 122/75 mmHg  Pulse 76  Temp(Src) 97.8 F (36.6 C) (Oral)  Resp 17  Ht 5\' 2"  (1.575 m)  Wt 158 lb 3.2 oz (71.759 kg)  BMI 28.93 kg/m2  SpO2 96% Physical Exam  Constitutional: She is oriented to person, place, and time. She appears well-developed and well-nourished.  Non-toxic appearance.  HENT:  Head: Normocephalic.  Right Ear: Tympanic membrane and external ear normal.  Left Ear: Tympanic membrane and external ear normal.  Eyes: EOM and lids are normal. Pupils are equal, round, and reactive to light.  Neck: Normal range of motion. Neck supple. Carotid bruit is not present.  Cardiovascular:  Normal rate, regular rhythm, normal heart sounds, intact distal pulses and normal pulses.   Pulmonary/Chest: Breath sounds normal. No respiratory distress.  Abdominal: Soft. Bowel sounds are normal. There is no tenderness. There is no guarding.  Musculoskeletal: Normal range of motion.  There are degenerative joint disease changes of all extremities. There is no palpable step off of the lumbar spine. There is paraspinal and spinal area tenderness of the lumbar region. There no hot areas appreciated.  Lymphadenopathy:       Head (right side): No submandibular  adenopathy present.       Head (left side): No submandibular adenopathy present.    She has no cervical adenopathy.  Neurological: She is alert and oriented to person, place, and time. She has normal strength. No cranial nerve deficit or sensory deficit. She exhibits normal muscle tone. Coordination normal.  Gait is slow but steady. No foot drop noted.  Skin: Skin is warm and dry.  Psychiatric: She has a normal mood and affect. Her speech is normal.  Nursing note and vitals reviewed.   ED Course  Procedures (including critical care time) Labs Review Labs Reviewed - No data to display  Imaging Review No results found.   EKG Interpretation None      MDM  Patient has history of degenerative disc disease, and arthritis. The examination is consistent with muscle strain and exacerbation of the muscle strain and degenerative disc disease. The patient is advised to rest her back is much as possible, use a heating pad when possible, prescription for Tylenol codeine and Robaxin given to the patient. Patient strongly advised to see her primary physician to develop a plan for breakthrough pain. Patient acknowledges understanding of these discharge instructions.    Final diagnoses:  None    **I have reviewed nursing notes, vital signs, and all appropriate lab and imaging results for this patient.Lenox Ahr, PA-C 12/24/14  [place], MD 01/01/15 [phone]

## 2014-12-24 NOTE — ED Notes (Signed)
Patient c/o mid-lower back pain. Per patient fell 2 weeks ago landing on back. Denies hitting head or LOC. Per patient back was sore but has now progressively gotten worse and pain has shifted across back. Denies any problems with urination or BM. CNS intact.

## 2014-12-26 ENCOUNTER — Other Ambulatory Visit (HOSPITAL_COMMUNITY): Payer: Self-pay | Admitting: Emergency Medicine

## 2014-12-26 ENCOUNTER — Emergency Department (HOSPITAL_COMMUNITY)
Admission: EM | Admit: 2014-12-26 | Discharge: 2014-12-26 | Disposition: A | Discharge status: Home / Self Care | Payer: Medicare Other | Attending: Emergency Medicine | Admitting: Emergency Medicine

## 2014-12-26 ENCOUNTER — Emergency Department (HOSPITAL_COMMUNITY): Payer: Medicare Other

## 2014-12-26 ENCOUNTER — Encounter (HOSPITAL_COMMUNITY): Payer: Self-pay | Admitting: *Deleted

## 2014-12-26 DIAGNOSIS — Z8619 Personal history of other infectious and parasitic diseases: Secondary | ICD-10-CM | POA: Insufficient documentation

## 2014-12-26 DIAGNOSIS — S32030A Wedge compression fracture of third lumbar vertebra, initial encounter for closed fracture: Secondary | ICD-10-CM | POA: Insufficient documentation

## 2014-12-26 DIAGNOSIS — E785 Hyperlipidemia, unspecified: Secondary | ICD-10-CM | POA: Diagnosis not present

## 2014-12-26 DIAGNOSIS — I10 Essential (primary) hypertension: Secondary | ICD-10-CM | POA: Diagnosis not present

## 2014-12-26 DIAGNOSIS — F419 Anxiety disorder, unspecified: Secondary | ICD-10-CM | POA: Insufficient documentation

## 2014-12-26 DIAGNOSIS — M810 Age-related osteoporosis without current pathological fracture: Secondary | ICD-10-CM | POA: Diagnosis not present

## 2014-12-26 DIAGNOSIS — K219 Gastro-esophageal reflux disease without esophagitis: Secondary | ICD-10-CM | POA: Diagnosis not present

## 2014-12-26 DIAGNOSIS — Y998 Other external cause status: Secondary | ICD-10-CM | POA: Diagnosis not present

## 2014-12-26 DIAGNOSIS — S22080A Wedge compression fracture of T11-T12 vertebra, initial encounter for closed fracture: Secondary | ICD-10-CM | POA: Insufficient documentation

## 2014-12-26 DIAGNOSIS — Y9289 Other specified places as the place of occurrence of the external cause: Secondary | ICD-10-CM | POA: Insufficient documentation

## 2014-12-26 DIAGNOSIS — M549 Dorsalgia, unspecified: Secondary | ICD-10-CM

## 2014-12-26 DIAGNOSIS — Z79899 Other long term (current) drug therapy: Secondary | ICD-10-CM | POA: Insufficient documentation

## 2014-12-26 DIAGNOSIS — Z720 Tobacco use: Secondary | ICD-10-CM | POA: Insufficient documentation

## 2014-12-26 DIAGNOSIS — Z86010 Personal history of colonic polyps: Secondary | ICD-10-CM | POA: Diagnosis not present

## 2014-12-26 DIAGNOSIS — W1839XA Other fall on same level, initial encounter: Secondary | ICD-10-CM | POA: Diagnosis not present

## 2014-12-26 DIAGNOSIS — Y9389 Activity, other specified: Secondary | ICD-10-CM | POA: Insufficient documentation

## 2014-12-26 DIAGNOSIS — G8929 Other chronic pain: Secondary | ICD-10-CM | POA: Insufficient documentation

## 2014-12-26 DIAGNOSIS — Z8709 Personal history of other diseases of the respiratory system: Secondary | ICD-10-CM | POA: Diagnosis not present

## 2014-12-26 DIAGNOSIS — S3992XA Unspecified injury of lower back, initial encounter: Secondary | ICD-10-CM | POA: Diagnosis present

## 2014-12-26 DIAGNOSIS — S32010A Wedge compression fracture of first lumbar vertebra, initial encounter for closed fracture: Secondary | ICD-10-CM | POA: Insufficient documentation

## 2014-12-26 DIAGNOSIS — W19XXXA Unspecified fall, initial encounter: Secondary | ICD-10-CM

## 2014-12-26 DIAGNOSIS — Z86011 Personal history of benign neoplasm of the brain: Secondary | ICD-10-CM | POA: Diagnosis not present

## 2014-12-26 DIAGNOSIS — Z8701 Personal history of pneumonia (recurrent): Secondary | ICD-10-CM | POA: Diagnosis not present

## 2014-12-26 DIAGNOSIS — F329 Major depressive disorder, single episode, unspecified: Secondary | ICD-10-CM | POA: Diagnosis not present

## 2014-12-26 DIAGNOSIS — S22000A Wedge compression fracture of unspecified thoracic vertebra, initial encounter for closed fracture: Secondary | ICD-10-CM

## 2014-12-26 MED ORDER — IBUPROFEN 800 MG PO TABS
800.0000 mg | ORAL_TABLET | Freq: Three times a day (TID) | ORAL | Status: DC | PRN
Start: 2014-12-26 — End: 2015-02-01

## 2014-12-26 MED ORDER — DOCUSATE SODIUM 100 MG PO CAPS: 100.0000 mg | ORAL_CAPSULE | Freq: Two times a day (BID) | ORAL | Status: DC

## 2014-12-26 MED ORDER — OXYCODONE-ACETAMINOPHEN 5-325 MG PO TABS: 1.0000 | ORAL_TABLET | ORAL | Status: DC | PRN

## 2014-12-26 MED ORDER — OXYCODONE-ACETAMINOPHEN 5-325 MG PO TABS
2.0000 | ORAL_TABLET | Freq: Once | ORAL | Status: AC
  Administered 2014-12-26: 2 via ORAL
  Filled 2014-12-26: qty 2

## 2014-12-26 NOTE — ED Provider Notes (Signed)
This chart was scribed for [place], DO by Edison Simon, ED Scribe. This patient was seen in room APA15/APA15   TIME SEEN: 9:26 AM  CHIEF COMPLAINT: back pain  HPI: [name] is a 64 y.o. female with history of degenerative disc disease who presents to the Emergency Department complaining of back pain some time after falling 2 weeks ago. She notes a bruise to her hip and hip pain with coughing after the fall. She denies striking her head. She states she was not evaluated for the fall. She states her hip pain resolved, but she then began having back pain. She states she was evaluated here 2 days ago for her back pain and discharged with Tylenol 3, which does not improve her pain. She denies numbness, tingling, or weakness in legs, incontinence, fever, dysuria, or hematuria.  No new injury.   ROS: See HPI Constitutional: no fever  Eyes: no drainage  ENT: no runny nose   Cardiovascular:  no chest pain  Resp: no SOB  GI: no vomiting GU: no dysuria, hematuria Integumentary: no rash  Allergy: no hives  Musculoskeletal: no leg swelling, positive back pain Neurological: no slurred speech, tingling, numbness, weakness, incontinence ROS otherwise negative  PAST MEDICAL HISTORY/PAST SURGICAL HISTORY:  Past Medical History  Diagnosis Date  . Disc degeneration   . Hyperlipemia     takes Pravastatin daily  . Brain tumor (benign)   . Pinched nerve in shoulder   . Degenerative disc disease   . Hypertension     takes Lotrel daily  . Depression     takes Effexor daily  . GERD (gastroesophageal reflux disease)     takes Omeprazole daily  . Anxiety     takes Xanax daily  . History of bronchitis     uses inhaler daily  . Pneumonia 2012  . Numbness in both hands   . Joint pain   . Chronic back pain     bulding disc  . Osteoporosis     Fosamax weekly  . Hepatitis     Hep C  . History of colon polyps   . History of staph infection     MEDICATIONS:  Prior to Admission  medications   Medication Sig Start Date End Date Taking? Authorizing Provider  acetaminophen-codeine (TYLENOL #3) 300-30 MG per tablet Take 1-2 tablets by mouth every 6 (six) hours as needed. 12/24/14   Lenox Ahr, PA-C  albuterol (PROVENTIL HFA;VENTOLIN HFA) 108 (90 BASE) MCG/ACT inhaler Inhale 2 puffs into the lungs every 6 (six) hours as needed for wheezing.    Historical Provider, MD  alendronate (FOSAMAX) 70 MG tablet Take 70 mg by mouth every 7 (seven) days. On mondays. Take with a full glass of water on an empty stomach.     Historical Provider, MD  alprazolam Duanne Moron) 2 MG tablet Take 1 tablet (2 mg total) by mouth 3 (three) times daily. 10/10/14   Levonne Spiller, MD  amLODipine-benazepril (LOTREL) 5-10 MG per capsule Take 1 capsule by mouth daily.    Historical Provider, MD  aspirin-acetaminophen-caffeine (EXCEDRIN MIGRAINE) [phone] MG per tablet Take 1 tablet by mouth every 6 (six) hours as needed for pain.    Historical Provider, MD  cholecalciferol (VITAMIN D) 1000 UNITS tablet Take 1,000 Units by mouth daily.    Historical Provider, MD  gabapentin (NEURONTIN) 800 MG tablet Take 800 mg by mouth 4 (four) times daily.    Historical Provider, MD  Nyoka Cowden Tea, Camillia sinensis, (GREEN TEA  PO) Take 1 tablet by mouth daily.    Historical Provider, MD  methocarbamol (ROBAXIN) 500 MG tablet Take 1 tablet (500 mg total) by mouth 3 (three) times daily. 12/24/14   Lenox Ahr, PA-C  omeprazole (PRILOSEC) 20 MG capsule Take 20 mg by mouth daily.    Historical Provider, MD  OPANA ER, CRUSH RESISTANT, 10 MG T12A 12 hr tablet Take 1 tablet by mouth 2 (two) times daily. 09/02/14   Historical Provider, MD  oxyCODONE-acetaminophen (PERCOCET) 10-325 MG per tablet Take 1 tablet by mouth every 4 (four) hours as needed for pain.    Historical Provider, MD  pravastatin (PRAVACHOL) 20 MG tablet Take 20 mg by mouth daily.    Historical Provider, MD  traMADol (ULTRAM) 50 MG tablet Take 1 tablet (50 mg total) by  mouth every 6 (six) hours as needed. 10/18/14   Glendell Docker, NP  venlafaxine XR (EFFEXOR-XR) 150 MG 24 hr capsule Take 2 capsules (300 mg total) by mouth daily. 10/10/14   Levonne Spiller, MD    ALLERGIES:  No Known Allergies  SOCIAL HISTORY:  History  Substance Use Topics  . Smoking status: Current Every Day Smoker -- 0.50 packs/day for 40 years    Types: Cigarettes  . Smokeless tobacco: Never Used  . Alcohol Use: No    FAMILY HISTORY: Family History  Problem Relation Age of Onset  . Bipolar disorder Daughter   . Drug abuse Son   . Drug abuse Grandchild   . Drug abuse Grandchild     EXAM: BP 130/98 mmHg  Pulse 73  Temp(Src) 98.7 F (37.1 C) (Oral)  Resp 18  Ht 5\' 2"  (1.575 m)  Wt 158 lb (71.668 kg)  BMI 28.89 kg/m2  SpO2 100% CONSTITUTIONAL: Alert and oriented and responds appropriately to questions. Well-appearing; well-nourished HEAD: Normocephalic EYES: Conjunctivae clear, PERRL ENT: normal nose; no rhinorrhea; moist mucous membranes; pharynx without lesions noted NECK: Supple, no meningismus, no LAD  CARD: RRR; S1 and S2 appreciated; no murmurs, no clicks, no rubs, no gallops RESP: Normal chest excursion without splinting or tachypnea; breath sounds clear and equal bilaterally; no wheezes, no rhonchi, no rales,  ABD/GI: Normal bowel sounds; non-distended; soft, non-tender, no rebound, no guarding BACK:  The back appears normal and is non-tender to palpation, there is no CVA tenderness; Tender to palpation diffuse throughout thoracic and lumbar spine over paraspinal musculature; has midline tenderness but no stepoffs or deformities EXT: Normal ROM in all joints; non-tender to palpation; no edema; normal capillary refill; no cyanosis; tender to palpation to right lateral and anterior hip, no joint effusion, erythema, or warmth    SKIN: Normal color for age and race; warm NEURO: Moves all extremities equally; 5/5 strength, normal sensation, no clonus; 2+ patellar and  tendon DTRs bilaterally, normal gait PSYCH: The patient's mood and manner are appropriate. Grooming and personal hygiene are appropriate.  MEDICAL DECISION MAKING: Patient here with diffuse thoracic and lumbar back pain. She is neurologically intact and has no fever. She did have a fall 2 weeks ago. We'll obtain x-rays of her thoracic and lumbar spine as well as her right hip. We'll give Percocet for pain.  ED PROGRESS:   10:35 AM Xrays show stable compression fractures of L 1, L3. Discussed with patient that x-ray reveals evidence of new fracture to T-11. Instructed her to follow up with PCP.  Patient is concerned that her pain will not continue to be managed with Percocet. Have discussed with her the possibility of  vertebral plasty but I do not feel she needs this emergently as she is very comfortable appearing after Percocet, ambulatory. We'll discuss with interventional radiology.  11:13 AM discussed with interventional radiology. They recommend a outpatient MRI of her thoracic and lumbar spine. They recommended if symptoms do not continue to improve with pain medicine and after her MRI that she may contact them to schedule an outpatient vertebroplasty. Discussed this with patient and her daughter who agree with plan. Discussed strict return precautions including new neurologic deficits, incontinence, urinary retention, uncontrolled pain. Patient is comfortable with this plan.     I personally performed the services described in this documentation, which was scribed in my presence. The recorded information has been reviewed and is accurate.    [place], DO 12/26/14 [phone]

## 2014-12-26 NOTE — ED Notes (Signed)
Patient reports falling x 2 weeks ago; seen here two days ago for same, given pain meds but they have not helped

## 2014-12-26 NOTE — Discharge Instructions (Signed)
We have ordered an MRI of your thoracic and lumbar spine to further evaluate your back pain. You will be contacted to have this MRI scheduled as an outpatient. If your pain continues despite pain medicine you may contact interventional radiology with the number provided to schedule appointment for vertebral plasty. This can only be done however after MRI has been completed. He if in the meantime you're having worsening pain that you're unable to control at home, numbness or weakness in your legs, difficulty holding her bowel or bladder, please return to the emergency department.    Back, Compression Fracture A compression fracture happens when a force is put upon the length of your spine. Slipping and falling on your bottom are examples of such a force. When this happens, sometimes the force is great enough to compress the building blocks (vertebral bodies) of your spine. Although this causes a lot of pain, this can usually be treated at home, unless your caregiver feels hospitalization is needed for pain control. Your backbone (spinal column) is made up of 24 main vertebral bodies in addition to the sacrum and coccyx (see illustration). These are held together by tough fibrous tissues (ligaments) and by support of your muscles. Nerve roots pass through the openings between the vertebrae. A sudden wrenching move, injury, or a fall may cause a compression fracture of one of the vertebral bodies. This may result in back pain or spread of pain into the belly (abdomen), the buttocks, and down the leg into the foot. Pain may also be created by muscle spasm alone. Large studies have been undertaken to determine the best possible course of action to help your back following injury and also to prevent future problems. The recommendations are as follows. FOLLOWING A COMPRESSION FRACTURE: Do the following only if advised by your caregiver.   If a back brace has been suggested or provided, wear it as directed.  Do  not stop wearing the back brace unless instructed by your caregiver.  When allowed to return to regular activities, avoid a sedentary lifestyle. Actively exercise. Sporadic weekend binges of tennis, racquetball, or waterskiing may actually aggravate or create problems, especially if you are not in condition for that activity.  Avoid sports requiring sudden body movements until you are in condition for them. Swimming and walking are safer activities.  Maintain good posture.  Avoid obesity.  If not already done, you should have a DEXA scan. Based on the results, be treated for osteoporosis. FOLLOWING ACUTE (SUDDEN) INJURY:  Only take over-the-counter or prescription medicines for pain, discomfort, or fever as directed by your caregiver.  Use bed rest for only the most extreme acute episode. Prolonged bed rest may aggravate your condition. Ice used for acute conditions is effective. Use a large plastic bag filled with ice. Wrap it in a towel. This also provides excellent pain relief. This may be continuous. Or use it for 30 minutes every 2 hours during acute phase, then as needed. Heat for 30 minutes prior to activities is helpful.  As soon as the acute phase (the time when your back is too painful for you to do normal activities) is over, it is important to resume normal activities and work [employer] information [employer] manager. Back injuries can cause potentially marked changes in lifestyle. So it is important to attack these problems aggressively.  See your caregiver for continued problems. He or she can help or refer you for appropriate exercises, physical therapy, and work hardening if needed.  If you are given narcotic  medications for your condition, for the next 24 hours do not:  Drive.  Operate machinery or power tools.  Sign legal documents.  Do not drink alcohol, or take sleeping pills or other medications that may interfere with treatment. If your caregiver has given you a follow-up appointment,  it is very important to keep that appointment. Not keeping the appointment could result in a chronic or permanent injury, pain, and disability. If there is any problem keeping the appointment, you must call back to this facility for assistance.  SEEK IMMEDIATE MEDICAL CARE IF:  You develop numbness, tingling, weakness, or problems with the use of your arms or legs.  You develop severe back pain not relieved with medications.  You have changes in bowel or bladder control.  You have increasing pain in any areas of the body. Document Released: 12/06/2005 Document Revised: 04/22/2014 Document Reviewed: 07/10/2008 [facility] Patient Information 2015 [place], [place]. This information is not intended to replace advice given to you by your health care provider. Make sure you discuss any questions you have with your health care provider.     Percutaneous Vertebroplasty Percutaneous (through the skin) vertebroplasty is a procedure used to treat collapsed bones (compression fractures) of the spine. Spine (vertebral) fractures can be painful and limit movement. Percutaneous vertebroplasty stabilizes the fracture by injecting bone cement into the collapsed bone. This restores the vertebra and helps prevent further collapse.  LET [facility] CARE PROVIDER KNOW ABOUT:  Any allergies you have.  All medicines you are taking, including vitamins, herbs, eye drops, creams, and over-the-counter medicines.  Previous problems you or members of your family have had with the use of anesthetics.  Any blood disorders you have.  Previous surgeries you have had. RISKS AND COMPLICATIONS Generally, this is a safe procedure. However, as with any procedure, complications can occur. Possible complications include:  Bone cement leakage.  Nerve damage.  Infection.  Need for another surgery.   Paralysis (very rare). Some people are at higher risk than others for this complication to occur. Your particular risks  should be discussed with your health care provider. BEFORE THE PROCEDURE  Your health care provider may want you to have blood tests. These tests can help tell how well your kidneys and liver are working. They can also show how well your blood clots.   You may be asked to stop using medicines that make it hard for your blood to clot. These can include blood thinners, aspirin, and nonsteroidal anti-inflammatory drugs (NSAIDs) like ibuprofen and naproxen. You may also need to stop taking vitamin E. Your health care provider will tell you when to stop taking these medicines, and when it is safe to start taking them again.  You may need to take medicine to help make your bones stronger. This will help prepare you for the procedure.  Do not eat or drink for 8 hours before your procedure or as told by your health care provider.  You might be asked to shower or wash at home with a soap that kills skin bacteria.  Make arrangements for someone to drive you home and stay with you for 24 hours. PROCEDURE  An IV tube will be inserted into one of your veins. Medicine to help you relax (sedative) will be given through the IV tube.  You will lie face down for the procedure.  Medicine that numbs the area (local anesthetic) will be injected into the skin right above the fractured vertebra. A small cut (incision) is then made  in that same area.  A hollow needle is inserted through the incision. An X-ray machine (fluoroscope) is used to guide the needle to the fractured vertebrae.  Bone cement is put through the hollow needle into the fractured vertebra. It hardens in about 20 minutes.  A bandage (dressing) is put over the incision site. AFTER THE PROCEDURE  You will stay in a recovery area until you are awake enough to eat and drink.  You will be checked to make sure you can get out of bed and walk around comfortably.  Some people find that pain relief is immediate. Others may notice pain going away  within 2 days of the procedure. Document Released: 08/04/2011 Document Revised: 12/11/2013 Document Reviewed: 08/13/2013 [facility] Patient Information 2015 [place], [place]. This information is not intended to replace advice given to you by your health care provider. Make sure you discuss any questions you have with your health care provider.

## 2014-12-30 ENCOUNTER — Encounter (HOSPITAL_COMMUNITY): Payer: Self-pay | Admitting: *Deleted

## 2014-12-30 ENCOUNTER — Emergency Department (HOSPITAL_COMMUNITY)
Admission: EM | Admit: 2014-12-30 | Discharge: 2014-12-30 | Disposition: A | Discharge status: Home / Self Care | Payer: Medicare Other | Attending: Emergency Medicine | Admitting: Emergency Medicine

## 2014-12-30 DIAGNOSIS — F419 Anxiety disorder, unspecified: Secondary | ICD-10-CM | POA: Insufficient documentation

## 2014-12-30 DIAGNOSIS — I10 Essential (primary) hypertension: Secondary | ICD-10-CM | POA: Diagnosis not present

## 2014-12-30 DIAGNOSIS — E785 Hyperlipidemia, unspecified: Secondary | ICD-10-CM | POA: Diagnosis not present

## 2014-12-30 DIAGNOSIS — Z720 Tobacco use: Secondary | ICD-10-CM | POA: Insufficient documentation

## 2014-12-30 DIAGNOSIS — F329 Major depressive disorder, single episode, unspecified: Secondary | ICD-10-CM | POA: Diagnosis not present

## 2014-12-30 DIAGNOSIS — Z86011 Personal history of benign neoplasm of the brain: Secondary | ICD-10-CM | POA: Diagnosis not present

## 2014-12-30 DIAGNOSIS — Z8701 Personal history of pneumonia (recurrent): Secondary | ICD-10-CM | POA: Insufficient documentation

## 2014-12-30 DIAGNOSIS — Z8619 Personal history of other infectious and parasitic diseases: Secondary | ICD-10-CM | POA: Diagnosis not present

## 2014-12-30 DIAGNOSIS — K219 Gastro-esophageal reflux disease without esophagitis: Secondary | ICD-10-CM | POA: Diagnosis not present

## 2014-12-30 DIAGNOSIS — S3992XS Unspecified injury of lower back, sequela: Secondary | ICD-10-CM | POA: Diagnosis present

## 2014-12-30 DIAGNOSIS — G8929 Other chronic pain: Secondary | ICD-10-CM | POA: Diagnosis not present

## 2014-12-30 DIAGNOSIS — Z79899 Other long term (current) drug therapy: Secondary | ICD-10-CM | POA: Insufficient documentation

## 2014-12-30 DIAGNOSIS — Z86010 Personal history of colonic polyps: Secondary | ICD-10-CM | POA: Insufficient documentation

## 2014-12-30 DIAGNOSIS — W1839XS Other fall on same level, sequela: Secondary | ICD-10-CM | POA: Insufficient documentation

## 2014-12-30 DIAGNOSIS — S22000S Wedge compression fracture of unspecified thoracic vertebra, sequela: Secondary | ICD-10-CM | POA: Diagnosis not present

## 2014-12-30 MED ORDER — OXYCODONE-ACETAMINOPHEN 5-325 MG PO TABS
2.0000 | ORAL_TABLET | Freq: Once | ORAL | Status: AC
  Administered 2014-12-30: 2 via ORAL
  Filled 2014-12-30: qty 2

## 2014-12-30 MED ORDER — OXYCODONE-ACETAMINOPHEN 5-325 MG PO TABS: 1.0000 | ORAL_TABLET | ORAL | Status: DC | PRN

## 2014-12-30 NOTE — ED Provider Notes (Signed)
CSN: [id number]     Arrival date & time 12/30/14  1145 History  This chart was scribed for non-physician practitioner, Kem Parkinson, PA-C, working with Fredia Sorrow, MD, by Stephania Fragmin, ED Scribe. This patient was seen in room APFT20/APFT20 and the patient's care was started at 1:05 PM.    Chief Complaint  Patient presents with  . Back Pain   Patient is a 64 y.o. female presenting with back pain. The history is provided by the patient. No language interpreter was used.  Back Pain Location:  Lumbar spine and thoracic spine Radiates to:  Does not radiate Pain severity:  Severe Onset quality:  Sudden Duration:  3 weeks Timing:  Constant Progression:  Unchanged Context: falling   Relieved by:  Narcotics Worsened by:  Lying down (on her back) Ineffective treatments:  None tried Associated symptoms: no abdominal pain, no bladder incontinence, no bowel incontinence, no dysuria, no fever, no numbness and no weakness      HPI Comments: [name] is a 64 y.o. female who presents to the Emergency Department complaining of constant, non-radiating, lower back pain, S/P a fall that occurred about 2.5 weeks ago. She was seen at the ED here 4 days ago for the same symptoms and is scheduled for an inpatient MRI here tomorrow; however, patient is here today because she ran out of the pain medications she received. She was prescribed 25 percocet pills and used her last dosage last night, reporting that she had to double her dosage because only 10 mg of oxycodone alleviates her pain. She notes that laying on her back exacerbates the pain and improves while lying on her right side. Patient states she has been to pain management in the past and used Percocet 10mg , and her tolerance for the medication is high. She is not currently undergoing pain management. She denies any new symptoms since last being seen here, including bowel or bladder incontinence, BLE weakness or numbness, fever, chills or  abdominal pain. She has arranged upcoming specialist f/u but is unable to recall physician's name.  Past Medical History  Diagnosis Date  . Disc degeneration   . Hyperlipemia     takes Pravastatin daily  . Brain tumor (benign)   . Pinched nerve in shoulder   . Degenerative disc disease   . Hypertension     takes Lotrel daily  . Depression     takes Effexor daily  . GERD (gastroesophageal reflux disease)     takes Omeprazole daily  . Anxiety     takes Xanax daily  . History of bronchitis     uses inhaler daily  . Pneumonia 2012  . Numbness in both hands   . Joint pain   . Chronic back pain     bulding disc  . Osteoporosis     Fosamax weekly  . Hepatitis     Hep C  . History of colon polyps   . History of staph infection    Past Surgical History  Procedure Laterality Date  . Knee surgery Right   . Facial fracture surgery      in the late 90's  . Ankle fracture surgery Bilateral early 2000's  . Tubal ligation    . Colonoscopy    . Orif calcaneous fracture Right 07/13/2013    Procedure: RIGHT OPEN REDUCTION INTERNAL FIXATION (ORIF) CALCANEUS FRACTURE;  Surgeon: Newt Minion, MD;  Location: [facility];  Service: Orthopedics;  Laterality: Right;   Family History  Problem Relation Age  of Onset  . Bipolar disorder Daughter   . Drug abuse Son   . Drug abuse Grandchild   . Drug abuse Grandchild    History  Substance Use Topics  . Smoking status: Current Every Day Smoker -- 0.50 packs/day for 40 years    Types: Cigarettes  . Smokeless tobacco: Never Used  . Alcohol Use: No   OB History    Gravida Para Term Preterm AB TAB SAB Ectopic Multiple Living   3 2 2  1  1   2      Review of Systems  Constitutional: Negative for fever.  Respiratory: Negative for shortness of breath.   Gastrointestinal: Negative for vomiting, abdominal pain, constipation and bowel incontinence.  Genitourinary: Negative for bladder incontinence, dysuria, hematuria, flank pain, decreased urine  volume and difficulty urinating.  Musculoskeletal: Positive for back pain. Negative for joint swelling and neck pain.  Skin: Negative for rash.  Neurological: Negative for weakness and numbness.  All other systems reviewed and are negative.     Allergies  Review of patient's allergies indicates no known allergies.  Home Medications   Prior to Admission medications   Medication Sig Start Date End Date Taking? Authorizing Provider  acetaminophen-codeine (TYLENOL #3) 300-30 MG per tablet Take 1-2 tablets by mouth every 6 (six) hours as needed. Patient taking differently: Take 1-2 tablets by mouth every 6 (six) hours as needed for moderate pain.  12/24/14   Lenox Ahr, PA-C  albuterol (PROVENTIL HFA;VENTOLIN HFA) 108 (90 BASE) MCG/ACT inhaler Inhale 2 puffs into the lungs every 6 (six) hours as needed for wheezing.    Historical Provider, MD  alendronate (FOSAMAX) 70 MG tablet Take 70 mg by mouth every 7 (seven) days. On mondays. Take with a full glass of water on an empty stomach.     Historical Provider, MD  alprazolam Duanne Moron) 2 MG tablet Take 1 tablet (2 mg total) by mouth 3 (three) times daily. 10/10/14   Levonne Spiller, MD  amLODipine-benazepril (LOTREL) 5-10 MG per capsule Take 1 capsule by mouth daily.    Historical Provider, MD  aspirin-acetaminophen-caffeine (EXCEDRIN MIGRAINE) [phone] MG per tablet Take 1 tablet by mouth every 6 (six) hours as needed for pain.    Historical Provider, MD  cholecalciferol (VITAMIN D) 1000 UNITS tablet Take 1,000 Units by mouth daily.    Historical Provider, MD  docusate sodium (COLACE) 100 MG capsule Take 1 capsule (100 mg total) by mouth every 12 (twelve) hours. 12/26/14   Kristen N Ward, DO  gabapentin (NEURONTIN) 800 MG tablet Take 800 mg by mouth 4 (four) times daily.    Historical Provider, MD  Nyoka Cowden Tea, Camillia sinensis, (GREEN TEA PO) Take 1 tablet by mouth daily.    Historical Provider, MD  ibuprofen (ADVIL,MOTRIN) 800 MG tablet Take 1  tablet (800 mg total) by mouth every 8 (eight) hours as needed for mild pain. 12/26/14   Kristen N Ward, DO  methocarbamol (ROBAXIN) 500 MG tablet Take 1 tablet (500 mg total) by mouth 3 (three) times daily. 12/24/14   Lenox Ahr, PA-C  omeprazole (PRILOSEC) 20 MG capsule Take 20 mg by mouth daily.    Historical Provider, MD  oxyCODONE-acetaminophen (PERCOCET/ROXICET) 5-325 MG per tablet Take 1 tablet by mouth every 4 (four) hours as needed. 12/26/14   Kristen N Ward, DO  pravastatin (PRAVACHOL) 20 MG tablet Take 20 mg by mouth daily.    Historical Provider, MD  traMADol (ULTRAM) 50 MG tablet Take 1 tablet (50 mg  total) by mouth every 6 (six) hours as needed. Patient not taking: Reported on 12/26/2014 10/18/14   Glendell Docker, NP  venlafaxine XR (EFFEXOR-XR) 150 MG 24 hr capsule Take 2 capsules (300 mg total) by mouth daily. 10/10/14   Levonne Spiller, MD   BP 126/67 mmHg  Pulse 88  Temp(Src) 98.2 F (36.8 C) (Oral)  Resp 16  Ht 5\' 2"  (1.575 m)  Wt 158 lb (71.668 kg)  BMI 28.89 kg/m2  SpO2 98% Physical Exam  Constitutional: She is oriented to person, place, and time. She appears well-developed and well-nourished. No distress.  HENT:  Head: Normocephalic and atraumatic.  Eyes: Conjunctivae and EOM are normal.  Neck: Normal range of motion. Neck supple. No tracheal deviation present.  Cardiovascular: Normal rate, regular rhythm, normal heart sounds and intact distal pulses.   No murmur heard. Pulmonary/Chest: Effort normal and breath sounds normal. No respiratory distress. She has no wheezes. She has no rales.  Abdominal: Soft. She exhibits no distension. There is no tenderness.  Musculoskeletal: Normal range of motion. She exhibits tenderness. She exhibits no edema.       Lumbar back: She exhibits tenderness and pain. She exhibits normal range of motion, no swelling, no deformity, no laceration and normal pulse.  Tenderness to palpation lower thoracic and lumbar spine. No body deformities.  4/5 strength against resistance to bilateral lower extremities.  Neurological: She is alert and oriented to person, place, and time. She has normal strength. No sensory deficit. She exhibits normal muscle tone. Coordination and gait normal.  Reflex Scores:      Patellar reflexes are 2+ on the right side and 2+ on the left side.      Achilles reflexes are 2+ on the right side and 2+ on the left side. Skin: Skin is warm and dry. No rash noted.  Psychiatric: She has a normal mood and affect. Her behavior is normal.  Nursing note and vitals reviewed.   ED Course  Procedures (including critical care time)  DIAGNOSTIC STUDIES: Oxygen Saturation is 98% on room air, normal by my interpretation.    COORDINATION OF CARE: 1:15 PM - Discussed treatment plan with pt at bedside and pt agreed to plan.   Labs Review Labs Reviewed - No data to display  Imaging Review No results found.   EKG Interpretation None      MDM   Final diagnoses:  Thoracic compression fracture, sequela   Pt seen here 4 days ago for same and diagnosed with compression fx of T11.  No focal neuro deficits.  No concerning sx's for emergent neurological or infectious process. I have advised pt that she will need to make f/u with orthopedics or pain management for ongoing pain control.  She verbalizes understanding and agrees to plan.     I personally performed the services described in this documentation, which was scribed in my presence. The recorded information has been reviewed and is accurate.      Xavian Hardcastle L. Ammie Ferrier 12/31/14 2148  Fredia Sorrow, MD 01/01/15 [phone]

## 2014-12-30 NOTE — ED Notes (Signed)
Fell app 3 weeks ago, seen here and scheduled for MRI . Here because of pain.  Out of pain meds.

## 2014-12-30 NOTE — ED Notes (Addendum)
Pt has ran out of pain meds (percocet), c/o lower back, states husband in waiting room and drove pt here; states MRI scheduled tom evening here

## 2014-12-31 ENCOUNTER — Other Ambulatory Visit (HOSPITAL_COMMUNITY): Payer: Self-pay

## 2014-12-31 ENCOUNTER — Ambulatory Visit (HOSPITAL_COMMUNITY): Payer: 59

## 2015-01-06 ENCOUNTER — Emergency Department (HOSPITAL_COMMUNITY)
Admission: EM | Admit: 2015-01-06 | Discharge: 2015-01-06 | Disposition: A | Discharge status: Home / Self Care | Payer: Medicare Other | Attending: Emergency Medicine | Admitting: Emergency Medicine

## 2015-01-06 ENCOUNTER — Encounter (HOSPITAL_COMMUNITY): Payer: Self-pay | Admitting: Emergency Medicine

## 2015-01-06 ENCOUNTER — Ambulatory Visit (HOSPITAL_COMMUNITY): Admission: RE | Admit: 2015-01-06 | Payer: Medicare Other | Source: Ambulatory Visit

## 2015-01-06 ENCOUNTER — Ambulatory Visit (HOSPITAL_COMMUNITY): Payer: Medicare Other

## 2015-01-06 ENCOUNTER — Other Ambulatory Visit (HOSPITAL_COMMUNITY): Payer: 59

## 2015-01-06 DIAGNOSIS — I10 Essential (primary) hypertension: Secondary | ICD-10-CM | POA: Diagnosis not present

## 2015-01-06 DIAGNOSIS — Z86010 Personal history of colonic polyps: Secondary | ICD-10-CM | POA: Insufficient documentation

## 2015-01-06 DIAGNOSIS — F419 Anxiety disorder, unspecified: Secondary | ICD-10-CM | POA: Insufficient documentation

## 2015-01-06 DIAGNOSIS — M546 Pain in thoracic spine: Secondary | ICD-10-CM | POA: Diagnosis not present

## 2015-01-06 DIAGNOSIS — E785 Hyperlipidemia, unspecified: Secondary | ICD-10-CM | POA: Insufficient documentation

## 2015-01-06 DIAGNOSIS — G8929 Other chronic pain: Secondary | ICD-10-CM | POA: Insufficient documentation

## 2015-01-06 DIAGNOSIS — K219 Gastro-esophageal reflux disease without esophagitis: Secondary | ICD-10-CM | POA: Diagnosis not present

## 2015-01-06 DIAGNOSIS — Z8619 Personal history of other infectious and parasitic diseases: Secondary | ICD-10-CM | POA: Diagnosis not present

## 2015-01-06 DIAGNOSIS — F329 Major depressive disorder, single episode, unspecified: Secondary | ICD-10-CM | POA: Insufficient documentation

## 2015-01-06 DIAGNOSIS — M545 Low back pain: Secondary | ICD-10-CM | POA: Diagnosis present

## 2015-01-06 MED ORDER — OXYCODONE-ACETAMINOPHEN 5-325 MG PO TABS
2.0000 | ORAL_TABLET | Freq: Once | ORAL | Status: AC
  Administered 2015-01-06: 2 via ORAL
  Filled 2015-01-06: qty 2

## 2015-01-06 MED ORDER — OXYCODONE-ACETAMINOPHEN 5-325 MG PO TABS: 1.0000 | ORAL_TABLET | Freq: Four times a day (QID) | ORAL | Status: DC | PRN

## 2015-01-06 NOTE — ED Notes (Signed)
Pt feel 2 weeks ago and fractured 2 disk, Schedule for MRI today, xray cancelled due to insurance not paying. Pt states she does not know what to do but come back, pain is no better, constant aching, worse with movement

## 2015-01-06 NOTE — Discharge Instructions (Signed)
Follow up with the back md that you were referred to in 1-3 weeks

## 2015-01-06 NOTE — ED Provider Notes (Signed)
CSN: [id number]     Arrival date & time 01/06/15  1435 History   First MD Initiated Contact with Patient 01/06/15 1613     Chief Complaint  Patient presents with  . Back Pain     (Consider location/radiation/quality/duration/timing/severity/associated sxs/prior Treatment) Patient is a 64 y.o. female presenting with back pain. The history is provided by the patient (the pt complains of lower back pain.   she was to get an mri today but her insurance would not pay.  she is out of her pain meds).  Back Pain Location:  Thoracic spine Quality:  Aching Pain severity:  Moderate Pain is:  Same all the time Timing:  Constant Progression:  Unchanged Chronicity:  Recurrent Context: not emotional stress   Associated symptoms: no abdominal pain, no chest pain and no headaches     Past Medical History  Diagnosis Date  . Disc degeneration   . Hyperlipemia     takes Pravastatin daily  . Brain tumor (benign)   . Pinched nerve in shoulder   . Degenerative disc disease   . Hypertension     takes Lotrel daily  . Depression     takes Effexor daily  . GERD (gastroesophageal reflux disease)     takes Omeprazole daily  . Anxiety     takes Xanax daily  . History of bronchitis     uses inhaler daily  . Pneumonia 2012  . Numbness in both hands   . Joint pain   . Chronic back pain     bulding disc  . Osteoporosis     Fosamax weekly  . Hepatitis     Hep C  . History of colon polyps   . History of staph infection    Past Surgical History  Procedure Laterality Date  . Knee surgery Right   . Facial fracture surgery      in the late 90's  . Ankle fracture surgery Bilateral early 2000's  . Tubal ligation    . Colonoscopy    . Orif calcaneous fracture Right 07/13/2013    Procedure: RIGHT OPEN REDUCTION INTERNAL FIXATION (ORIF) CALCANEUS FRACTURE;  Surgeon: Newt Minion, MD;  Location: [facility];  Service: Orthopedics;  Laterality: Right;   Family History  Problem Relation Age of Onset  .  Bipolar disorder Daughter   . Drug abuse Son   . Drug abuse Grandchild   . Drug abuse Grandchild    History  Substance Use Topics  . Smoking status: Current Every Day Smoker -- 0.50 packs/day for 40 years    Types: Cigarettes  . Smokeless tobacco: Never Used  . Alcohol Use: No   OB History    Gravida Para Term Preterm AB TAB SAB Ectopic Multiple Living   3 2 2  1  1   2      Review of Systems  Constitutional: Negative for appetite change and fatigue.  HENT: Negative for congestion, ear discharge and sinus pressure.   Eyes: Negative for discharge.  Respiratory: Negative for cough.   Cardiovascular: Negative for chest pain.  Gastrointestinal: Negative for abdominal pain and diarrhea.  Genitourinary: Negative for frequency and hematuria.  Musculoskeletal: Positive for back pain.  Skin: Negative for rash.  Neurological: Negative for seizures and headaches.  Psychiatric/Behavioral: Negative for hallucinations.      Allergies  Review of patient's allergies indicates no known allergies.  Home Medications   Prior to Admission medications   Medication Sig Start Date End Date Taking? Authorizing Provider  acetaminophen-codeine (  TYLENOL #3) 300-30 MG per tablet Take 1-2 tablets by mouth every 6 (six) hours as needed. Patient taking differently: Take 1-2 tablets by mouth every 6 (six) hours as needed for moderate pain.  12/24/14   Lenox Ahr, PA-C  albuterol (PROVENTIL HFA;VENTOLIN HFA) 108 (90 BASE) MCG/ACT inhaler Inhale 2 puffs into the lungs every 6 (six) hours as needed for wheezing.    Historical Provider, MD  alendronate (FOSAMAX) 70 MG tablet Take 70 mg by mouth every 7 (seven) days. On mondays. Take with a full glass of water on an empty stomach.     Historical Provider, MD  alprazolam Duanne Moron) 2 MG tablet Take 1 tablet (2 mg total) by mouth 3 (three) times daily. 10/10/14   Levonne Spiller, MD  amLODipine-benazepril (LOTREL) 5-10 MG per capsule Take 1 capsule by mouth daily.     Historical Provider, MD  aspirin-acetaminophen-caffeine (EXCEDRIN MIGRAINE) [phone] MG per tablet Take 1 tablet by mouth every 6 (six) hours as needed for pain.    Historical Provider, MD  cholecalciferol (VITAMIN D) 1000 UNITS tablet Take 1,000 Units by mouth daily.    Historical Provider, MD  docusate sodium (COLACE) 100 MG capsule Take 1 capsule (100 mg total) by mouth every 12 (twelve) hours. 12/26/14   Kristen N Ward, DO  gabapentin (NEURONTIN) 800 MG tablet Take 800 mg by mouth 4 (four) times daily.    Historical Provider, MD  Nyoka Cowden Tea, Camillia sinensis, (GREEN TEA PO) Take 1 tablet by mouth daily.    Historical Provider, MD  ibuprofen (ADVIL,MOTRIN) 800 MG tablet Take 1 tablet (800 mg total) by mouth every 8 (eight) hours as needed for mild pain. 12/26/14   Kristen N Ward, DO  methocarbamol (ROBAXIN) 500 MG tablet Take 1 tablet (500 mg total) by mouth 3 (three) times daily. 12/24/14   Lenox Ahr, PA-C  omeprazole (PRILOSEC) 20 MG capsule Take 20 mg by mouth daily.    Historical Provider, MD  oxyCODONE-acetaminophen (PERCOCET/ROXICET) 5-325 MG per tablet Take 1 tablet by mouth every 6 (six) hours as needed. 01/06/15   Maudry Diego, MD  pravastatin (PRAVACHOL) 20 MG tablet Take 20 mg by mouth daily.    Historical Provider, MD  traMADol (ULTRAM) 50 MG tablet Take 1 tablet (50 mg total) by mouth every 6 (six) hours as needed. Patient not taking: Reported on 12/26/2014 10/18/14   Glendell Docker, NP  venlafaxine XR (EFFEXOR-XR) 150 MG 24 hr capsule Take 2 capsules (300 mg total) by mouth daily. 10/10/14   Levonne Spiller, MD   BP 121/91 mmHg  Pulse 83  Temp(Src) 98 F (36.7 C) (Oral)  Resp 18  Ht 5\' 2"  (1.575 m)  Wt 158 lb (71.668 kg)  BMI 28.89 kg/m2  SpO2 100% Physical Exam  Constitutional: She is oriented to person, place, and time. She appears well-developed.  HENT:  Head: Normocephalic.  Eyes: Conjunctivae and EOM are normal. No scleral icterus.  Neck: Neck supple. No  thyromegaly present.  Cardiovascular: Normal rate and regular rhythm.  Exam reveals no gallop and no friction rub.   No murmur heard. Pulmonary/Chest: No stridor. She has no wheezes. She has no rales. She exhibits no tenderness.  Abdominal: She exhibits no distension. There is no tenderness. There is no rebound.  Musculoskeletal: Normal range of motion. She exhibits tenderness. She exhibits no edema.  Tender lower thoracic and lumbar spine  Lymphadenopathy:    She has no cervical adenopathy.  Neurological: She is oriented to person, place, and  time. She exhibits normal muscle tone. Coordination normal.  Skin: No rash noted. No erythema.  Psychiatric: She has a normal mood and affect. Her behavior is normal.    ED Course  Procedures (including critical care time) Labs Review Labs Reviewed - No data to display  Imaging Review No results found.   EKG Interpretation None      MDM   Final diagnoses:  Midline thoracic back pain    Pt with hx of compressin fx.    tx with percocet and follow up with back md      Maudry Diego, MD 01/06/15 1650

## 2015-01-09 ENCOUNTER — Ambulatory Visit (INDEPENDENT_AMBULATORY_CARE_PROVIDER_SITE_OTHER): Payer: Medicare Other | Admitting: Psychiatry

## 2015-01-09 ENCOUNTER — Encounter (HOSPITAL_COMMUNITY): Payer: Self-pay | Admitting: Psychiatry

## 2015-01-09 VITALS — BP 136/76 | HR 73 | Ht 62.0 in | Wt 156.0 lb

## 2015-01-09 DIAGNOSIS — F411 Generalized anxiety disorder: Secondary | ICD-10-CM

## 2015-01-09 DIAGNOSIS — F332 Major depressive disorder, recurrent severe without psychotic features: Secondary | ICD-10-CM

## 2015-01-09 MED ORDER — VENLAFAXINE HCL ER 150 MG PO CP24: 300.0000 mg | ORAL_CAPSULE | Freq: Every day | ORAL | Status: DC

## 2015-01-09 MED ORDER — ALPRAZOLAM 2 MG PO TABS: 2.0000 mg | ORAL_TABLET | Freq: Three times a day (TID) | ORAL | Status: DC
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
## 2015-01-10 ENCOUNTER — Ambulatory Visit (HOSPITAL_COMMUNITY): Payer: Self-pay | Admitting: Psychiatry

## 2015-02-01 ENCOUNTER — Emergency Department (HOSPITAL_COMMUNITY)
Admission: EM | Admit: 2015-02-01 | Discharge: 2015-02-01 | Disposition: A | Discharge status: Home / Self Care | Payer: Medicare Other | Attending: Emergency Medicine | Admitting: Emergency Medicine

## 2015-02-01 ENCOUNTER — Encounter (HOSPITAL_COMMUNITY): Payer: Self-pay | Admitting: *Deleted

## 2015-02-01 DIAGNOSIS — Z79899 Other long term (current) drug therapy: Secondary | ICD-10-CM | POA: Diagnosis not present

## 2015-02-01 DIAGNOSIS — Z86010 Personal history of colonic polyps: Secondary | ICD-10-CM | POA: Insufficient documentation

## 2015-02-01 DIAGNOSIS — K219 Gastro-esophageal reflux disease without esophagitis: Secondary | ICD-10-CM | POA: Insufficient documentation

## 2015-02-01 DIAGNOSIS — Z8709 Personal history of other diseases of the respiratory system: Secondary | ICD-10-CM | POA: Insufficient documentation

## 2015-02-01 DIAGNOSIS — E785 Hyperlipidemia, unspecified: Secondary | ICD-10-CM | POA: Diagnosis not present

## 2015-02-01 DIAGNOSIS — Z8701 Personal history of pneumonia (recurrent): Secondary | ICD-10-CM | POA: Diagnosis not present

## 2015-02-01 DIAGNOSIS — Z8619 Personal history of other infectious and parasitic diseases: Secondary | ICD-10-CM | POA: Insufficient documentation

## 2015-02-01 DIAGNOSIS — F419 Anxiety disorder, unspecified: Secondary | ICD-10-CM | POA: Insufficient documentation

## 2015-02-01 DIAGNOSIS — Z720 Tobacco use: Secondary | ICD-10-CM | POA: Diagnosis not present

## 2015-02-01 DIAGNOSIS — M810 Age-related osteoporosis without current pathological fracture: Secondary | ICD-10-CM | POA: Diagnosis not present

## 2015-02-01 DIAGNOSIS — Z7983 Long term (current) use of bisphosphonates: Secondary | ICD-10-CM | POA: Diagnosis not present

## 2015-02-01 DIAGNOSIS — M546 Pain in thoracic spine: Secondary | ICD-10-CM | POA: Insufficient documentation

## 2015-02-01 DIAGNOSIS — F329 Major depressive disorder, single episode, unspecified: Secondary | ICD-10-CM | POA: Insufficient documentation

## 2015-02-01 DIAGNOSIS — M549 Dorsalgia, unspecified: Secondary | ICD-10-CM

## 2015-02-01 DIAGNOSIS — G8929 Other chronic pain: Secondary | ICD-10-CM | POA: Insufficient documentation

## 2015-02-01 DIAGNOSIS — Z86011 Personal history of benign neoplasm of the brain: Secondary | ICD-10-CM | POA: Insufficient documentation

## 2015-02-01 DIAGNOSIS — I10 Essential (primary) hypertension: Secondary | ICD-10-CM | POA: Diagnosis not present

## 2015-02-01 MED ORDER — ACETAMINOPHEN-CODEINE #3 300-30 MG PO TABS
2.0000 | ORAL_TABLET | Freq: Once | ORAL | Status: AC
  Administered 2015-02-01: 2 via ORAL
  Filled 2015-02-01: qty 2

## 2015-02-01 MED ORDER — IBUPROFEN 800 MG PO TABS: 800.0000 mg | ORAL_TABLET | Freq: Three times a day (TID) | ORAL | Status: DC

## 2015-02-01 MED ORDER — ACETAMINOPHEN-CODEINE #3 300-30 MG PO TABS: 1.0000 | ORAL_TABLET | Freq: Four times a day (QID) | ORAL | Status: DC | PRN

## 2015-02-01 MED ORDER — BACLOFEN 10 MG PO TABS: 10.0000 mg | ORAL_TABLET | Freq: Three times a day (TID) | ORAL | Status: AC

## 2015-02-01 MED ORDER — IBUPROFEN 800 MG PO TABS
800.0000 mg | ORAL_TABLET | Freq: Once | ORAL | Status: AC
Start: 2015-02-01 — End: 2015-02-01
  Administered 2015-02-01: 800 mg via ORAL
  Filled 2015-02-01: qty 1

## 2015-02-01 MED ORDER — DIAZEPAM 5 MG PO TABS
5.0000 mg | ORAL_TABLET | Freq: Once | ORAL | Status: AC
  Administered 2015-02-01: 5 mg via ORAL
  Filled 2015-02-01: qty 1

## 2015-02-01 NOTE — ED Notes (Signed)
Pt states chronic back pain. Has an appt to see PCP on Tuesday.

## 2015-02-01 NOTE — ED Provider Notes (Signed)
CSN: [id number]     Arrival date & time 02/01/15  1704 History   First MD Initiated Contact with Patient 02/01/15 1803     Chief Complaint  Patient presents with  . Back Pain     (Consider location/radiation/quality/duration/timing/severity/associated sxs/prior Treatment) Patient is a 64 y.o. female presenting with back pain. The history is provided by the patient.  Back Pain Location:  Thoracic spine Quality:  Aching Pain severity:  Moderate Pain is:  Same all the time Onset quality:  Gradual Timing:  Intermittent Progression:  Unchanged Chronicity:  Chronic Context comment:  Hx of previous compression fracture. Relieved by:  Nothing Worsened by:  Movement Ineffective treatments:  Bed rest and OTC medications Associated symptoms: no abdominal pain, no bladder incontinence, no bowel incontinence, no chest pain, no dysuria and no perianal numbness   Risk factors: menopause     Past Medical History  Diagnosis Date  . Disc degeneration   . Hyperlipemia     takes Pravastatin daily  . Brain tumor (benign)   . Pinched nerve in shoulder   . Degenerative disc disease   . Hypertension     takes Lotrel daily  . Depression     takes Effexor daily  . GERD (gastroesophageal reflux disease)     takes Omeprazole daily  . Anxiety     takes Xanax daily  . History of bronchitis     uses inhaler daily  . Pneumonia 2012  . Numbness in both hands   . Joint pain   . Chronic back pain     bulding disc  . Osteoporosis     Fosamax weekly  . Hepatitis     Hep C  . History of colon polyps   . History of staph infection    Past Surgical History  Procedure Laterality Date  . Knee surgery Right   . Facial fracture surgery      in the late 90's  . Ankle fracture surgery Bilateral early 2000's  . Tubal ligation    . Colonoscopy    . Orif calcaneous fracture Right 07/13/2013    Procedure: RIGHT OPEN REDUCTION INTERNAL FIXATION (ORIF) CALCANEUS FRACTURE;  Surgeon: Newt Minion, MD;   Location: [place];  Service: Orthopedics;  Laterality: Right;   Family History  Problem Relation Age of Onset  . Bipolar disorder Daughter   . Drug abuse Son   . Drug abuse Grandchild   . Drug abuse Grandchild    History  Substance Use Topics  . Smoking status: Current Every Day Smoker -- 0.50 packs/day for 40 years    Types: Cigarettes  . Smokeless tobacco: Never Used  . Alcohol Use: No   OB History    Gravida Para Term Preterm AB TAB SAB Ectopic Multiple Living   3 2 2  1  1   2      Review of Systems  Constitutional: Negative for activity change.       All ROS Neg except as noted in HPI  HENT: Negative for nosebleeds.   Eyes: Negative for photophobia and discharge.  Respiratory: Negative for cough, shortness of breath and wheezing.   Cardiovascular: Negative for chest pain and palpitations.  Gastrointestinal: Negative for abdominal pain, blood in stool and bowel incontinence.  Genitourinary: Negative for bladder incontinence, dysuria, frequency and hematuria.  Musculoskeletal: Positive for back pain. Negative for arthralgias and neck pain.  Skin: Negative.   Neurological: Negative for dizziness, seizures and speech difficulty.  Psychiatric/Behavioral: Negative for hallucinations and  confusion. The patient is nervous/anxious.        Depression      Allergies  Review of patient's allergies indicates no known allergies.  Home Medications   Prior to Admission medications   Medication Sig Start Date End Date Taking? Authorizing Provider  albuterol (PROVENTIL HFA;VENTOLIN HFA) 108 (90 BASE) MCG/ACT inhaler Inhale 2 puffs into the lungs every 6 (six) hours as needed for wheezing.   Yes Historical Provider, MD  alprazolam Duanne Moron) 2 MG tablet Take 1 tablet (2 mg total) by mouth 3 (three) times daily. 01/09/15  Yes Levonne Spiller, MD  amLODipine-benazepril (LOTREL) 5-10 MG per capsule Take 1 capsule by mouth daily.   Yes Historical Provider, MD  aspirin-acetaminophen-caffeine  (EXCEDRIN MIGRAINE) [phone] MG per tablet Take 1 tablet by mouth every 6 (six) hours as needed for pain.   Yes Historical Provider, MD  Calcium Carbonate-Vitamin D (CALCIUM + D PO) Take 1 tablet by mouth daily.    Yes Historical Provider, MD  gabapentin (NEURONTIN) 800 MG tablet Take 800 mg by mouth 4 (four) times daily.   Yes Historical Provider, MD  Nyoka Cowden Tea, Camillia sinensis, (GREEN TEA PO) Take 1 tablet by mouth daily.   Yes Historical Provider, MD  omeprazole (PRILOSEC) 20 MG capsule Take 20 mg by mouth daily.   Yes Historical Provider, MD  pravastatin (PRAVACHOL) 20 MG tablet Take 20 mg by mouth daily.   Yes Historical Provider, MD  venlafaxine XR (EFFEXOR-XR) 150 MG 24 hr capsule Take 2 capsules (300 mg total) by mouth daily. 01/09/15  Yes Levonne Spiller, MD  acetaminophen-codeine (TYLENOL #3) 300-30 MG per tablet Take 1-2 tablets by mouth every 6 (six) hours as needed. Patient not taking: Reported on 01/09/2015 12/24/14   Lenox Ahr, PA-C  alendronate (FOSAMAX) 70 MG tablet Take 70 mg by mouth every 7 (seven) days. On mondays. Take with a full glass of water on an empty stomach.     Historical Provider, MD  oxyCODONE-acetaminophen (PERCOCET/ROXICET) 5-325 MG per tablet Take 1 tablet by mouth every 6 (six) hours as needed. Patient not taking: Reported on 02/01/2015 01/06/15   Maudry Diego, MD   BP 142/96 mmHg  Pulse 85  Temp(Src) 98.6 F (37 C) (Oral)  Resp 16  Ht 5\' 2"  (1.575 m)  Wt 155 lb (70.308 kg)  BMI 28.34 kg/m2  SpO2 97% Physical Exam  Constitutional: She is oriented to person, place, and time. She appears well-developed and well-nourished.  Non-toxic appearance.  HENT:  Head: Normocephalic.  Right Ear: Tympanic membrane and external ear normal.  Left Ear: Tympanic membrane and external ear normal.  Eyes: EOM and lids are normal. Pupils are equal, round, and reactive to light.  Neck: Normal range of motion. Neck supple. Carotid bruit is not present.  Cardiovascular:  Normal rate, regular rhythm, normal heart sounds, intact distal pulses and normal pulses.   Pulmonary/Chest: Breath sounds normal. No respiratory distress.  Abdominal: Soft. Bowel sounds are normal. There is no tenderness. There is no guarding.  Musculoskeletal:       Thoracic back: She exhibits decreased range of motion, tenderness and pain. She exhibits no edema.       Lumbar back: She exhibits decreased range of motion.  Lymphadenopathy:       Head (right side): No submandibular adenopathy present.       Head (left side): No submandibular adenopathy present.    She has no cervical adenopathy.  Neurological: She is alert and oriented to person, place,  and time. She has normal strength. No cranial nerve deficit or sensory deficit.  Skin: Skin is warm and dry.  Psychiatric: She has a normal mood and affect. Her speech is normal.  Nursing note and vitals reviewed.   ED Course  Procedures (including critical care time) Labs Review Labs Reviewed - No data to display  Imaging Review No results found.   EKG Interpretation None      MDM  Vital signs are within normal limits. No gross neurovascular compromise on exam. No evidence for abscess, AAA, or cauda equina. Suspect exacerbation of chronic back pain.  Plan - Rx for baclofen, tylenol codeine, and ibuprofen. Pt to see specialist on 2/16.   Final diagnoses:  Chronic back pain    **I have reviewed nursing notes, vital signs, and all appropriate lab and imaging results for this patient.Lenox Ahr, PA-C 02/01/15 1833  Dot Lanes, MD 02/03/15 2113

## 2015-02-01 NOTE — ED Notes (Signed)
Patient with no complaints at this time. Respirations even and unlabored. Skin warm/dry. Discharge instructions reviewed with patient at this time. Patient given opportunity to voice concerns/ask questions. Patient discharged at this time and left Emergency Department with steady gait.   

## 2015-02-01 NOTE — Discharge Instructions (Signed)

## 2015-03-07 ENCOUNTER — Other Ambulatory Visit (HOSPITAL_COMMUNITY): Payer: Self-pay | Admitting: Psychiatry

## 2015-03-07 ENCOUNTER — Encounter (HOSPITAL_COMMUNITY): Payer: Self-pay | Admitting: *Deleted

## 2015-03-07 ENCOUNTER — Emergency Department (HOSPITAL_COMMUNITY)
Admission: EM | Admit: 2015-03-07 | Discharge: 2015-03-07 | Disposition: A | Discharge status: Home / Self Care | Payer: Medicare Other | Attending: Emergency Medicine | Admitting: Emergency Medicine

## 2015-03-07 DIAGNOSIS — Z8701 Personal history of pneumonia (recurrent): Secondary | ICD-10-CM | POA: Diagnosis not present

## 2015-03-07 DIAGNOSIS — F329 Major depressive disorder, single episode, unspecified: Secondary | ICD-10-CM | POA: Insufficient documentation

## 2015-03-07 DIAGNOSIS — Z791 Long term (current) use of non-steroidal anti-inflammatories (NSAID): Secondary | ICD-10-CM | POA: Insufficient documentation

## 2015-03-07 DIAGNOSIS — M810 Age-related osteoporosis without current pathological fracture: Secondary | ICD-10-CM | POA: Insufficient documentation

## 2015-03-07 DIAGNOSIS — Z79899 Other long term (current) drug therapy: Secondary | ICD-10-CM | POA: Insufficient documentation

## 2015-03-07 DIAGNOSIS — Z86018 Personal history of other benign neoplasm: Secondary | ICD-10-CM | POA: Diagnosis not present

## 2015-03-07 DIAGNOSIS — G8929 Other chronic pain: Secondary | ICD-10-CM | POA: Diagnosis not present

## 2015-03-07 DIAGNOSIS — K219 Gastro-esophageal reflux disease without esophagitis: Secondary | ICD-10-CM | POA: Insufficient documentation

## 2015-03-07 DIAGNOSIS — Z720 Tobacco use: Secondary | ICD-10-CM | POA: Insufficient documentation

## 2015-03-07 DIAGNOSIS — M545 Low back pain: Secondary | ICD-10-CM | POA: Diagnosis present

## 2015-03-07 DIAGNOSIS — Z8619 Personal history of other infectious and parasitic diseases: Secondary | ICD-10-CM | POA: Insufficient documentation

## 2015-03-07 DIAGNOSIS — E785 Hyperlipidemia, unspecified: Secondary | ICD-10-CM | POA: Diagnosis not present

## 2015-03-07 DIAGNOSIS — Z8709 Personal history of other diseases of the respiratory system: Secondary | ICD-10-CM | POA: Diagnosis not present

## 2015-03-07 DIAGNOSIS — F419 Anxiety disorder, unspecified: Secondary | ICD-10-CM | POA: Diagnosis not present

## 2015-03-07 DIAGNOSIS — I10 Essential (primary) hypertension: Secondary | ICD-10-CM | POA: Diagnosis not present

## 2015-03-07 DIAGNOSIS — Z86010 Personal history of colonic polyps: Secondary | ICD-10-CM | POA: Insufficient documentation

## 2015-03-07 NOTE — ED Notes (Signed)
Fractured thoracic? Vertebrae 2 months ago.  Pain ever since.  Pain today 9/10 today radiating into L hip.  Has been denied approval by insurance for surgical intervention and MRIs.  Is awaiting acceptance into pain clinic.

## 2015-03-07 NOTE — Discharge Instructions (Signed)
You will need to see your primary care doctor or a pain clinic for your chronic pain.

## 2015-03-07 NOTE — ED Provider Notes (Signed)
CSN: [id number]     Arrival date & time 03/07/15  1155 History   First MD Initiated Contact with Patient 03/07/15 1354     Chief Complaint  Patient presents with  . Back Pain     (Consider location/radiation/quality/duration/timing/severity/associated sxs/prior Treatment) Patient is a 64 y.o. female presenting with back pain. The history is provided by the patient.  Back Pain Location:  Lumbar spine Quality:  Aching and shooting Radiates to:  Does not radiate Pain severity:  Severe Pain is:  Same all the time Duration:  3 months Timing:  Constant Progression:  Unchanged Relieved by:  Nothing Worsened by:  Movement, standing and bending  [name] is a 64 y.o. female who presents to the ED with chronic low back pain. patient sleeping flat on back and snoring. I woke her to get her history. She was here in January and prescribed 30 percocet 5/325 mg and then returned in February and received tylenol with Codeine 20 tablets. Nothing has changed and no new injury. She just needs her Percocet.  She went to her PCP and he told her she will need to go to a pain management clinic for treatment and is working on getting her in a clinic. Patient states she comes to the ED to get her pain medication because her PCP will not prescribe her narcotics. After getting this history the patient closed her eyes and started snoring again. Patient's husband is a cancer patient and has recently been hospitalized.    Past Medical History  Diagnosis Date  . Disc degeneration   . Hyperlipemia     takes Pravastatin daily  . Brain tumor (benign)   . Pinched nerve in shoulder   . Degenerative disc disease   . Hypertension     takes Lotrel daily  . Depression     takes Effexor daily  . GERD (gastroesophageal reflux disease)     takes Omeprazole daily  . Anxiety     takes Xanax daily  . History of bronchitis     uses inhaler daily  . Pneumonia 2012  . Numbness in both hands   . Joint pain   .  Chronic back pain     bulding disc  . Osteoporosis     Fosamax weekly  . Hepatitis     Hep C  . History of colon polyps   . History of staph infection    Past Surgical History  Procedure Laterality Date  . Knee surgery Right   . Facial fracture surgery      in the late 90's  . Ankle fracture surgery Bilateral early 2000's  . Tubal ligation    . Colonoscopy    . Orif calcaneous fracture Right 07/13/2013    Procedure: RIGHT OPEN REDUCTION INTERNAL FIXATION (ORIF) CALCANEUS FRACTURE;  Surgeon: Newt Minion, MD;  Location: [facility];  Service: Orthopedics;  Laterality: Right;   Family History  Problem Relation Age of Onset  . Bipolar disorder Daughter   . Drug abuse Son   . Drug abuse Grandchild   . Drug abuse Grandchild    History  Substance Use Topics  . Smoking status: Current Every Day Smoker -- 0.50 packs/day for 40 years    Types: Cigarettes  . Smokeless tobacco: Never Used  . Alcohol Use: No   OB History    Gravida Para Term Preterm AB TAB SAB Ectopic Multiple Living   3 2 2  1  1    2  Review of Systems  Musculoskeletal: Positive for back pain.  all other systems negative    Allergies  Review of patient's allergies indicates no known allergies.  Home Medications   Prior to Admission medications   Medication Sig Start Date End Date Taking? Authorizing Provider  acetaminophen-codeine (TYLENOL #3) 300-30 MG per tablet Take 1-2 tablets by mouth every 6 (six) hours as needed. 02/01/15   Lily Kocher, PA-C  albuterol (PROVENTIL HFA;VENTOLIN HFA) 108 (90 BASE) MCG/ACT inhaler Inhale 2 puffs into the lungs every 6 (six) hours as needed for wheezing.    Historical Provider, MD  alendronate (FOSAMAX) 70 MG tablet Take 70 mg by mouth every 7 (seven) days. On mondays. Take with a full glass of water on an empty stomach.     Historical Provider, MD  alprazolam Duanne Moron) 2 MG tablet Take 1 tablet (2 mg total) by mouth 3 (three) times daily. 01/09/15   Cloria Spring, MD   amLODipine-benazepril (LOTREL) 5-10 MG per capsule Take 1 capsule by mouth daily.    Historical Provider, MD  aspirin-acetaminophen-caffeine (EXCEDRIN MIGRAINE) [phone] MG per tablet Take 1 tablet by mouth every 6 (six) hours as needed for pain.    Historical Provider, MD  Calcium Carbonate-Vitamin D (CALCIUM + D PO) Take 1 tablet by mouth daily.     Historical Provider, MD  gabapentin (NEURONTIN) 800 MG tablet Take 800 mg by mouth 4 (four) times daily.    Historical Provider, MD  Nyoka Cowden Tea, Camillia sinensis, (GREEN TEA PO) Take 1 tablet by mouth daily.    Historical Provider, MD  ibuprofen (ADVIL,MOTRIN) 800 MG tablet Take 1 tablet (800 mg total) by mouth 3 (three) times daily. 02/01/15   Lily Kocher, PA-C  omeprazole (PRILOSEC) 20 MG capsule Take 20 mg by mouth daily.    Historical Provider, MD  oxyCODONE-acetaminophen (PERCOCET/ROXICET) 5-325 MG per tablet Take 1 tablet by mouth every 6 (six) hours as needed. Patient not taking: Reported on 02/01/2015 01/06/15   Milton Ferguson, MD  pravastatin (PRAVACHOL) 20 MG tablet Take 20 mg by mouth daily.    Historical Provider, MD  venlafaxine XR (EFFEXOR-XR) 150 MG 24 hr capsule Take 2 capsules (300 mg total) by mouth daily. 01/09/15   Cloria Spring, MD   BP 90/66 mmHg  Pulse 66  Temp(Src) 97.8 F (36.6 C) (Oral)  Resp 18  Ht 5\' 2"  (1.575 m)  Wt 155 lb (70.308 kg)  BMI 28.34 kg/m2  SpO2 98% Physical Exam  Constitutional: She is oriented to person, place, and time. She appears well-developed and well-nourished. No distress.  Patient resting comfortably in exam room, sleeping.   HENT:  Head: Normocephalic.  Eyes: EOM are normal.  Neck: Neck supple.  Cardiovascular: Normal rate.   Pulmonary/Chest: Effort normal.  Musculoskeletal: Normal range of motion.  Patient ambulatory without difficulty.   Neurological: She is alert and oriented to person, place, and time. No cranial nerve deficit.  Skin: Skin is warm and dry.  Psychiatric: She has  a normal mood and affect. Her behavior is normal.  Nursing note and vitals reviewed.   ED Course  Procedures  I discussed with the patient that she will need to see her PCP to discuss pain management for her chronic pain until he can get her into a pain management clinic.The patient is here asking for Percocet and is sleeping soundly flat on her back on the stretcher and appears very comfortable. Her BP is 90/66 and her heart rate is 66.  MDM  64 y.o. female with chronic back pain that has been ongoing since 2011. Stable for d/c to follow up with her PCP for pain management. No grossly abnormal neuro deficits.   On d/c patient walking down the hall with a brisk walk without difficulty.  Final diagnoses:  Chronic pain       Ashley Murrain, NP 03/08/15 1841  Merryl Hacker, MD 03/08/15 2038

## 2015-03-07 NOTE — ED Notes (Signed)
Pt asleep when provider when in room to assess.

## 2015-03-07 NOTE — ED Notes (Signed)
Pt lying in bed with eyes closed.  No distress noted or any s/s of severe pain.  NP aware of pts bp.

## 2015-04-10 ENCOUNTER — Encounter (HOSPITAL_COMMUNITY): Payer: Self-pay | Admitting: Psychiatry

## 2015-04-10 ENCOUNTER — Ambulatory Visit (INDEPENDENT_AMBULATORY_CARE_PROVIDER_SITE_OTHER): Payer: Medicare Other | Admitting: Psychiatry

## 2015-04-10 VITALS — BP 148/85 | HR 77 | Ht 62.0 in | Wt 147.6 lb

## 2015-04-10 DIAGNOSIS — F411 Generalized anxiety disorder: Secondary | ICD-10-CM

## 2015-04-10 DIAGNOSIS — F332 Major depressive disorder, recurrent severe without psychotic features: Secondary | ICD-10-CM

## 2015-04-10 MED ORDER — VENLAFAXINE HCL ER 150 MG PO CP24: 300.0000 mg | ORAL_CAPSULE | Freq: Every day | ORAL | Status: DC

## 2015-04-10 MED ORDER — ALPRAZOLAM 2 MG PO TABS
2.0000 mg | ORAL_TABLET | Freq: Three times a day (TID) | ORAL | Status: DC
Start: 2015-04-10 — End: 2015-05-29
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
## 2015-05-19 ENCOUNTER — Emergency Department (HOSPITAL_COMMUNITY): Payer: Medicare Other

## 2015-05-19 ENCOUNTER — Encounter (HOSPITAL_COMMUNITY): Payer: Self-pay | Admitting: Emergency Medicine

## 2015-05-19 ENCOUNTER — Emergency Department (HOSPITAL_COMMUNITY)
Admission: EM | Admit: 2015-05-19 | Discharge: 2015-05-19 | Disposition: A | Discharge status: Home / Self Care | Payer: Medicare Other | Attending: Emergency Medicine | Admitting: Emergency Medicine

## 2015-05-19 DIAGNOSIS — Z86010 Personal history of colonic polyps: Secondary | ICD-10-CM | POA: Insufficient documentation

## 2015-05-19 DIAGNOSIS — Z8619 Personal history of other infectious and parasitic diseases: Secondary | ICD-10-CM | POA: Insufficient documentation

## 2015-05-19 DIAGNOSIS — Z86011 Personal history of benign neoplasm of the brain: Secondary | ICD-10-CM | POA: Diagnosis not present

## 2015-05-19 DIAGNOSIS — F329 Major depressive disorder, single episode, unspecified: Secondary | ICD-10-CM | POA: Insufficient documentation

## 2015-05-19 DIAGNOSIS — K219 Gastro-esophageal reflux disease without esophagitis: Secondary | ICD-10-CM | POA: Insufficient documentation

## 2015-05-19 DIAGNOSIS — F419 Anxiety disorder, unspecified: Secondary | ICD-10-CM | POA: Insufficient documentation

## 2015-05-19 DIAGNOSIS — M810 Age-related osteoporosis without current pathological fracture: Secondary | ICD-10-CM | POA: Diagnosis not present

## 2015-05-19 DIAGNOSIS — R05 Cough: Secondary | ICD-10-CM | POA: Diagnosis present

## 2015-05-19 DIAGNOSIS — G8929 Other chronic pain: Secondary | ICD-10-CM | POA: Diagnosis not present

## 2015-05-19 DIAGNOSIS — J209 Acute bronchitis, unspecified: Secondary | ICD-10-CM | POA: Diagnosis not present

## 2015-05-19 DIAGNOSIS — Z720 Tobacco use: Secondary | ICD-10-CM | POA: Insufficient documentation

## 2015-05-19 DIAGNOSIS — R0789 Other chest pain: Secondary | ICD-10-CM

## 2015-05-19 DIAGNOSIS — I10 Essential (primary) hypertension: Secondary | ICD-10-CM | POA: Diagnosis not present

## 2015-05-19 DIAGNOSIS — E785 Hyperlipidemia, unspecified: Secondary | ICD-10-CM | POA: Insufficient documentation

## 2015-05-19 DIAGNOSIS — Z8701 Personal history of pneumonia (recurrent): Secondary | ICD-10-CM | POA: Diagnosis not present

## 2015-05-19 DIAGNOSIS — M549 Dorsalgia, unspecified: Secondary | ICD-10-CM | POA: Diagnosis not present

## 2015-05-19 DIAGNOSIS — Z8669 Personal history of other diseases of the nervous system and sense organs: Secondary | ICD-10-CM | POA: Diagnosis not present

## 2015-05-19 MED ORDER — ALBUTEROL SULFATE (2.5 MG/3ML) 0.083% IN NEBU
2.5000 mg | INHALATION_SOLUTION | Freq: Once | RESPIRATORY_TRACT | Status: AC
  Administered 2015-05-19: 2.5 mg via RESPIRATORY_TRACT
  Filled 2015-05-19: qty 3

## 2015-05-19 MED ORDER — HYDROCOD POLST-CPM POLST ER 10-8 MG/5ML PO SUER
5.0000 mL | Freq: Once | ORAL | Status: AC
Start: 2015-05-19 — End: 2015-05-19
  Administered 2015-05-19: 5 mL via ORAL
  Filled 2015-05-19: qty 5

## 2015-05-19 MED ORDER — ALBUTEROL SULFATE HFA 108 (90 BASE) MCG/ACT IN AERS
2.0000 | INHALATION_SPRAY | Freq: Once | RESPIRATORY_TRACT | Status: AC
  Administered 2015-05-19: 2 via RESPIRATORY_TRACT
  Filled 2015-05-19: qty 6.7

## 2015-05-19 MED ORDER — METHOCARBAMOL 500 MG PO TABS
500.0000 mg | ORAL_TABLET | Freq: Once | ORAL | Status: AC
  Administered 2015-05-19: 500 mg via ORAL
  Filled 2015-05-19: qty 1

## 2015-05-19 MED ORDER — DEXAMETHASONE 4 MG PO TABS: 4.0000 mg | ORAL_TABLET | Freq: Two times a day (BID) | ORAL | Status: DC

## 2015-05-19 MED ORDER — PREDNISONE 50 MG PO TABS
60.0000 mg | ORAL_TABLET | Freq: Once | ORAL | Status: AC
  Administered 2015-05-19: 60 mg via ORAL
  Filled 2015-05-19 (×2): qty 1

## 2015-05-19 MED ORDER — HYDROCODONE-HOMATROPINE 5-1.5 MG/5ML PO SYRP: 5.0000 mL | ORAL_SOLUTION | Freq: Four times a day (QID) | ORAL | Status: DC | PRN

## 2015-05-19 NOTE — Discharge Instructions (Signed)
Your x-ray is consistent with bronchitis. Please use albuterol 2 puffs every 4 hours. Please use Decadron 2 times daily with food until all taken. Please use Hycodan every 6 hours for cough and congestion. Please see your primary physician for recheck. Please return to the emergency department if any emergent changes, problems, or concerns. Acute Bronchitis Bronchitis is when the airways that extend from the windpipe into the lungs get red, puffy, and painful (inflamed). Bronchitis often causes thick spit (mucus) to develop. This leads to a cough. A cough is the most common symptom of bronchitis. In acute bronchitis, the condition usually begins suddenly and goes away over time (usually in 2 weeks). Smoking, allergies, and asthma can make bronchitis worse. Repeated episodes of bronchitis may cause more lung problems. HOME CARE  Rest.  Drink enough fluids to keep your pee (urine) clear or pale yellow (unless you need to limit fluids as told by your doctor).  Only take over-the-counter or prescription medicines as told by your doctor.  Avoid smoking and secondhand smoke. These can make bronchitis worse. If you are a smoker, think about using nicotine gum or skin patches. Quitting smoking will help your lungs heal faster.  Reduce the chance of getting bronchitis again by:  Washing your hands often.  Avoiding people with cold symptoms.  Trying not to touch your hands to your mouth, nose, or eyes.  Follow up with your doctor as told. GET HELP IF: Your symptoms do not improve after 1 week of treatment. Symptoms include:  Cough.  Fever.  Coughing up thick spit.  Body aches.  Chest congestion.  Chills.  Shortness of breath.  Sore throat. GET HELP RIGHT AWAY IF:   You have an increased fever.  You have chills.  You have severe shortness of breath.  You have bloody thick spit (sputum).  You throw up (vomit) often.  You lose too much body fluid (dehydration).  You have a  severe headache.  You faint. MAKE SURE YOU:   Understand these instructions.  Will watch your condition.  Will get help right away if you are not doing well or get worse. Document Released: 05/24/2008 Document Revised: 08/08/2013 Document Reviewed: 05/29/2013 [facility] Patient Information 2015 [place], [place]. This information is not intended to replace advice given to you by your health care provider. Make sure you discuss any questions you have with your health care provider.  Chest Wall Pain Chest wall pain is pain in or around the bones and muscles of your chest. It may take up to 6 weeks to get better. It may take longer if you must stay physically active in your work and activities.  CAUSES  Chest wall pain may happen on its own. However, it may be caused by:  A viral illness like the flu.  Injury.  Coughing.  Exercise.  Arthritis.  Fibromyalgia.  Shingles. HOME CARE INSTRUCTIONS   Avoid overtiring physical activity. Try not to strain or perform activities that cause pain. This includes any activities using your chest or your abdominal and side muscles, especially if heavy weights are used.  Put ice on the sore area.  Put ice in a plastic bag.  Place a towel between your skin and the bag.  Leave the ice on for 15-20 minutes per hour while awake for the first 2 days.  Only take over-the-counter or prescription medicines for pain, discomfort, or fever as directed by your caregiver. SEEK IMMEDIATE MEDICAL CARE IF:   Your pain increases, or you are very  uncomfortable.  You have a fever.  Your chest pain becomes worse.  You have new, unexplained symptoms.  You have nausea or vomiting.  You feel sweaty or lightheaded.  You have a cough with phlegm (sputum), or you cough up blood. MAKE SURE YOU:   Understand these instructions.  Will watch your condition.  Will get help right away if you are not doing well or get worse. Document Released: 12/06/2005  Document Revised: 02/28/2012 Document Reviewed: 08/02/2011 [facility] Patient Information 2015 [place], [place]. This information is not intended to replace advice given to you by your health care provider. Make sure you discuss any questions you have with your health care provider.

## 2015-05-19 NOTE — ED Notes (Signed)
Pt states that she has had a bad cough for the past few days.

## 2015-05-19 NOTE — ED Provider Notes (Signed)
CSN: [id number]     Arrival date & time 05/19/15  1205 History  This chart was scribed for non-physician practitioner, Lily Kocher, PA-C working with Francine Graven, DO by Tula Nakayama, ED scribe. This patient was seen in room APFT21/APFT21 and the patient's care was started at 12:52 PM  Chief Complaint  Patient presents with  . Cough   Patient is a 64 y.o. female presenting with cough. The history is provided by the patient. No language interpreter was used.  Cough Cough characteristics:  Productive Sputum characteristics:  Yellow Severity:  Moderate Onset quality:  Gradual Duration:  3 days Timing:  Intermittent Progression:  Unchanged Chronicity:  New Smoker: yes   Context: sick contacts and weather changes   Relieved by:  None tried Worsened by:  Nothing tried Associated symptoms: sore throat   Associated symptoms: no fever     HPI Comments: [name] is a 64 y.o. female with a history of chronic bronchitis and chronic back pain who presents to the Emergency Department complaining of intermittent, moderate productive cough with yellow sputum that started 3 days ago. Pt states mild sore throat as an associated symptom. She also notes chronic back pain that becomes worse with coughing. Pt has not tried any treatment PTA. She smokes 1/2 ppd. Pt denies fever and hemoptysis as associated symptoms.  Past Medical History  Diagnosis Date  . Disc degeneration   . Hyperlipemia     takes Pravastatin daily  . Brain tumor (benign)   . Pinched nerve in shoulder   . Degenerative disc disease   . Hypertension     takes Lotrel daily  . Depression     takes Effexor daily  . GERD (gastroesophageal reflux disease)     takes Omeprazole daily  . Anxiety     takes Xanax daily  . History of bronchitis     uses inhaler daily  . Pneumonia 2012  . Numbness in both hands   . Joint pain   . Chronic back pain     bulding disc  . Osteoporosis     Fosamax weekly  . Hepatitis      Hep C  . History of colon polyps   . History of staph infection    Past Surgical History  Procedure Laterality Date  . Knee surgery Right   . Facial fracture surgery      in the late 90's  . Ankle fracture surgery Bilateral early 2000's  . Tubal ligation    . Colonoscopy    . Orif calcaneous fracture Right 07/13/2013    Procedure: RIGHT OPEN REDUCTION INTERNAL FIXATION (ORIF) CALCANEUS FRACTURE;  Surgeon: Newt Minion, MD;  Location: [facility];  Service: Orthopedics;  Laterality: Right;   Family History  Problem Relation Age of Onset  . Bipolar disorder Daughter   . Drug abuse Son   . Drug abuse Grandchild   . Drug abuse Grandchild    History  Substance Use Topics  . Smoking status: Current Every Day Smoker -- 0.50 packs/day for 40 years    Types: Cigarettes  . Smokeless tobacco: Never Used  . Alcohol Use: No   OB History    Gravida Para Term Preterm AB TAB SAB Ectopic Multiple Living   3 2 2  1  1   2      Review of Systems  Constitutional: Negative for fever.  HENT: Positive for sore throat.   Respiratory: Positive for cough.   Musculoskeletal: Positive for back pain and  arthralgias.  Psychiatric/Behavioral: The patient is nervous/anxious.        Depression  All other systems reviewed and are negative.   Allergies  Review of patient's allergies indicates no known allergies.  Home Medications   Prior to Admission medications   Medication Sig Start Date End Date Taking? Authorizing Provider  albuterol (PROVENTIL HFA;VENTOLIN HFA) 108 (90 BASE) MCG/ACT inhaler Inhale 2 puffs into the lungs every 6 (six) hours as needed for wheezing.    Historical Provider, MD  alendronate (FOSAMAX) 70 MG tablet Take 70 mg by mouth every 7 (seven) days. On mondays. Take with a full glass of water on an empty stomach.     Historical Provider, MD  alprazolam Duanne Moron) 2 MG tablet Take 1 tablet (2 mg total) by mouth 3 (three) times daily. 04/10/15   Cloria Spring, MD   amLODipine-benazepril (LOTREL) 5-10 MG per capsule Take 1 capsule by mouth daily.    Historical Provider, MD  aspirin-acetaminophen-caffeine (EXCEDRIN MIGRAINE) [phone] MG per tablet Take 1 tablet by mouth every 6 (six) hours as needed for pain.    Historical Provider, MD  Calcium Carbonate-Vitamin D (CALCIUM + D PO) Take 1 tablet by mouth daily.     Historical Provider, MD  gabapentin (NEURONTIN) 800 MG tablet Take 800 mg by mouth 4 (four) times daily.    Historical Provider, MD  Nyoka Cowden Tea, Camillia sinensis, (GREEN TEA PO) Take 1 tablet by mouth daily.    Historical Provider, MD  omeprazole (PRILOSEC) 20 MG capsule Take 20 mg by mouth daily.    Historical Provider, MD  oxyCODONE-acetaminophen (PERCOCET/ROXICET) 5-325 MG per tablet Take 1 tablet by mouth every 6 (six) hours as needed. 01/06/15   Milton Ferguson, MD  pravastatin (PRAVACHOL) 20 MG tablet Take 20 mg by mouth daily.    Historical Provider, MD  venlafaxine XR (EFFEXOR-XR) 150 MG 24 hr capsule Take 2 capsules (300 mg total) by mouth daily. 04/10/15   Cloria Spring, MD   BP 147/77 mmHg  Pulse 76  Temp(Src) 98.2 F (36.8 C) (Oral)  Ht 5\' 2"  (1.575 m)  Wt 150 lb (68.04 kg)  BMI 27.43 kg/m2  SpO2 97% Physical Exam  Constitutional: She appears well-developed and well-nourished. No distress.  HENT:  Head: Normocephalic and atraumatic.  Mild nasal congestion present; airway is patent  Eyes: Conjunctivae and EOM are normal.  Neck: Normal range of motion. Neck supple. No tracheal deviation present.  Cardiovascular: Normal rate, regular rhythm and normal heart sounds.   Pulmonary/Chest: Effort normal. No respiratory distress. She has wheezes.  Rhonchi present bilaterally; decreased breath sounds at the bases; speaks in complete sentences  Musculoskeletal: She exhibits no edema.       Arms: Cap refill <2 s  Skin: Skin is warm and dry.  Psychiatric: She has a normal mood and affect. Her behavior is normal.  Nursing note and vitals  reviewed.   ED Course  Procedures   DIAGNOSTIC STUDIES: Oxygen Saturation is 97% on RA, normal by my interpretation.    COORDINATION OF CARE: 12:59 PM Discussed treatment plan with pt which includes chest x-ray. Pt agreed to plan.   Labs Review Labs Reviewed - No data to display  Imaging Review Dg Chest 2 View  05/19/2015   CLINICAL DATA:  Cough and congestion for a few days.  EXAM: CHEST  2 VIEW  COMPARISON:  PA and lateral chest 11/25/2014 and 09/01/2012. Plain films thoracic spine 12/26/2014.  FINDINGS: The lungs are clear. Heart size is  normal. No pneumothorax or pleural effusion. T11 compression fracture is identified as on comparison thoracic spine plain films.  IMPRESSION: No acute disease.   Electronically Signed   By: Inge Rise M.D.   On: 05/19/2015 12:43     EKG Interpretation None      MDM  Pulse oximetry is 97% on room air. Within normal limits by my interpretation. Vital signs are well within normal limits. Chest x-ray shows no acute disease. There is noted an old compression fracture at T11.  Patient is given an albuterol inhaler. Prescription for Hycodan and Decadron also given to the patient. The patient is to follow with her primary physician for additional evaluation and management if not improving.    Final diagnoses:  None    **I have reviewed nursing notes, vital signs, and all appropriate lab and imaging results for this patient.*  **I personally performed the services described in this documentation, which was scribed in my presence. The recorded information has been reviewed and is accurate.Lily Kocher, PA-C 05/20/15 [place], DO 05/22/15 2009

## 2015-05-25 ENCOUNTER — Encounter (HOSPITAL_COMMUNITY): Payer: Self-pay | Admitting: Emergency Medicine

## 2015-05-25 ENCOUNTER — Other Ambulatory Visit (HOSPITAL_COMMUNITY): Payer: Self-pay

## 2015-05-25 ENCOUNTER — Emergency Department (HOSPITAL_COMMUNITY): Payer: Medicare Other

## 2015-05-25 ENCOUNTER — Inpatient Hospital Stay (HOSPITAL_COMMUNITY)
Admission: EM | Admit: 2015-05-25 | Discharge: 2015-05-29 | DRG: 193 | Disposition: A | Discharge status: Home / Self Care | Payer: Medicare Other | Attending: Family Medicine | Admitting: Family Medicine

## 2015-05-25 DIAGNOSIS — J209 Acute bronchitis, unspecified: Secondary | ICD-10-CM | POA: Diagnosis present

## 2015-05-25 DIAGNOSIS — T380X5A Adverse effect of glucocorticoids and synthetic analogues, initial encounter: Secondary | ICD-10-CM | POA: Diagnosis present

## 2015-05-25 DIAGNOSIS — G8929 Other chronic pain: Secondary | ICD-10-CM | POA: Diagnosis present

## 2015-05-25 DIAGNOSIS — R062 Wheezing: Secondary | ICD-10-CM | POA: Diagnosis present

## 2015-05-25 DIAGNOSIS — I10 Essential (primary) hypertension: Secondary | ICD-10-CM | POA: Diagnosis present

## 2015-05-25 DIAGNOSIS — R739 Hyperglycemia, unspecified: Secondary | ICD-10-CM | POA: Diagnosis present

## 2015-05-25 DIAGNOSIS — R0902 Hypoxemia: Secondary | ICD-10-CM

## 2015-05-25 DIAGNOSIS — J9601 Acute respiratory failure with hypoxia: Secondary | ICD-10-CM

## 2015-05-25 DIAGNOSIS — J189 Pneumonia, unspecified organism: Secondary | ICD-10-CM | POA: Diagnosis present

## 2015-05-25 DIAGNOSIS — M810 Age-related osteoporosis without current pathological fracture: Secondary | ICD-10-CM | POA: Diagnosis present

## 2015-05-25 DIAGNOSIS — J440 Chronic obstructive pulmonary disease with acute lower respiratory infection: Secondary | ICD-10-CM | POA: Diagnosis present

## 2015-05-25 DIAGNOSIS — F411 Generalized anxiety disorder: Secondary | ICD-10-CM | POA: Diagnosis present

## 2015-05-25 DIAGNOSIS — Z23 Encounter for immunization: Secondary | ICD-10-CM | POA: Diagnosis not present

## 2015-05-25 DIAGNOSIS — E785 Hyperlipidemia, unspecified: Secondary | ICD-10-CM | POA: Diagnosis present

## 2015-05-25 DIAGNOSIS — F17210 Nicotine dependence, cigarettes, uncomplicated: Secondary | ICD-10-CM | POA: Diagnosis present

## 2015-05-25 DIAGNOSIS — J441 Chronic obstructive pulmonary disease with (acute) exacerbation: Secondary | ICD-10-CM | POA: Diagnosis present

## 2015-05-25 DIAGNOSIS — F17200 Nicotine dependence, unspecified, uncomplicated: Secondary | ICD-10-CM | POA: Diagnosis not present

## 2015-05-25 DIAGNOSIS — K219 Gastro-esophageal reflux disease without esophagitis: Secondary | ICD-10-CM | POA: Diagnosis present

## 2015-05-25 LAB — CBC WITH DIFFERENTIAL/PLATELET
BASOS PCT: 0 % (ref 0–1)
Basophils Absolute: 0 10*3/uL (ref 0.0–0.1)
EOS ABS: 0.1 10*3/uL (ref 0.0–0.7)
Eosinophils Relative: 0 % (ref 0–5)
HCT: 37.5 % (ref 36.0–46.0)
Hemoglobin: 12.4 g/dL (ref 12.0–15.0)
Lymphocytes Relative: 9 % — ABNORMAL LOW (ref 12–46)
Lymphs Abs: 2.1 10*3/uL (ref 0.7–4.0)
MCH: 32 pg (ref 26.0–34.0)
MCHC: 33.1 g/dL (ref 30.0–36.0)
MCV: 96.9 fL (ref 78.0–100.0)
MONO ABS: 1.7 10*3/uL — AB (ref 0.1–1.0)
Monocytes Relative: 8 % (ref 3–12)
NEUTROS PCT: 83 % — AB (ref 43–77)
Neutro Abs: 19.3 10*3/uL — ABNORMAL HIGH (ref 1.7–7.7)
PLATELETS: 274 10*3/uL (ref 150–400)
RBC: 3.87 MIL/uL (ref 3.87–5.11)
RDW: 14.1 % (ref 11.5–15.5)
WBC: 23.3 10*3/uL — ABNORMAL HIGH (ref 4.0–10.5)

## 2015-05-25 LAB — BASIC METABOLIC PANEL
Anion gap: 9 (ref 5–15)
BUN: 13 mg/dL (ref 6–20)
CALCIUM: 8.6 mg/dL — AB (ref 8.9–10.3)
CHLORIDE: 104 mmol/L (ref 101–111)
CO2: 28 mmol/L (ref 22–32)
Creatinine, Ser: 0.78 mg/dL (ref 0.44–1.00)
GLUCOSE: 112 mg/dL — AB (ref 65–99)
POTASSIUM: 3.4 mmol/L — AB (ref 3.5–5.1)
Sodium: 141 mmol/L (ref 135–145)

## 2015-05-25 MED ORDER — ONDANSETRON HCL 4 MG PO TABS: 4.0000 mg | ORAL_TABLET | Freq: Four times a day (QID) | ORAL | Status: DC | PRN

## 2015-05-25 MED ORDER — IPRATROPIUM-ALBUTEROL 0.5-2.5 (3) MG/3ML IN SOLN
3.0000 mL | Freq: Once | RESPIRATORY_TRACT | Status: AC
  Administered 2015-05-25: 3 mL via RESPIRATORY_TRACT
  Filled 2015-05-25: qty 3

## 2015-05-25 MED ORDER — AZITHROMYCIN 250 MG PO TABS
500.0000 mg | ORAL_TABLET | Freq: Once | ORAL | Status: AC
  Administered 2015-05-25: 500 mg via ORAL
  Filled 2015-05-25: qty 2

## 2015-05-25 MED ORDER — ALBUTEROL SULFATE (2.5 MG/3ML) 0.083% IN NEBU
2.5000 mg | INHALATION_SOLUTION | RESPIRATORY_TRACT | Status: DC | PRN
  Administered 2015-05-26 – 2015-05-29 (×5): 2.5 mg via RESPIRATORY_TRACT
  Filled 2015-05-25 (×6): qty 3

## 2015-05-25 MED ORDER — AMLODIPINE BESY-BENAZEPRIL HCL 5-10 MG PO CAPS: 1.0000 | ORAL_CAPSULE | Freq: Every day | ORAL | Status: DC

## 2015-05-25 MED ORDER — DEXTROSE 5 % IV SOLN
1.0000 g | Freq: Once | INTRAVENOUS | Status: AC
  Administered 2015-05-25: 1 g via INTRAVENOUS
  Filled 2015-05-25: qty 10

## 2015-05-25 MED ORDER — PRAVASTATIN SODIUM 10 MG PO TABS
20.0000 mg | ORAL_TABLET | Freq: Every day | ORAL | Status: DC
  Administered 2015-05-26 – 2015-05-29 (×4): 20 mg via ORAL
  Filled 2015-05-25 (×5): qty 2

## 2015-05-25 MED ORDER — ONDANSETRON HCL 4 MG/2ML IJ SOLN: 4.0000 mg | Freq: Four times a day (QID) | INTRAMUSCULAR | Status: DC | PRN

## 2015-05-25 MED ORDER — HEPARIN SODIUM (PORCINE) 5000 UNIT/ML IJ SOLN
5000.0000 [IU] | Freq: Three times a day (TID) | INTRAMUSCULAR | Status: DC
  Administered 2015-05-25 – 2015-05-29 (×11): 5000 [IU] via SUBCUTANEOUS
  Filled 2015-05-25 (×10): qty 1

## 2015-05-25 MED ORDER — PNEUMOCOCCAL VAC POLYVALENT 25 MCG/0.5ML IJ INJ
0.5000 mL | INJECTION | INTRAMUSCULAR | Status: AC
  Administered 2015-05-26: 0.5 mL via INTRAMUSCULAR
  Filled 2015-05-25: qty 0.5

## 2015-05-25 MED ORDER — ALPRAZOLAM 1 MG PO TABS
2.0000 mg | ORAL_TABLET | Freq: Three times a day (TID) | ORAL | Status: DC | PRN
  Administered 2015-05-25 – 2015-05-29 (×10): 2 mg via ORAL
  Filled 2015-05-25 (×10): qty 2

## 2015-05-25 MED ORDER — AMLODIPINE BESYLATE 5 MG PO TABS
5.0000 mg | ORAL_TABLET | Freq: Every day | ORAL | Status: DC
  Administered 2015-05-26 – 2015-05-29 (×4): 5 mg via ORAL
  Filled 2015-05-25 (×4): qty 1

## 2015-05-25 MED ORDER — METHYLPREDNISOLONE SODIUM SUCC 125 MG IJ SOLR
125.0000 mg | Freq: Four times a day (QID) | INTRAMUSCULAR | Status: DC
  Administered 2015-05-25 – 2015-05-26 (×4): 125 mg via INTRAVENOUS
  Filled 2015-05-25 (×4): qty 2

## 2015-05-25 MED ORDER — PANTOPRAZOLE SODIUM 40 MG PO TBEC
40.0000 mg | DELAYED_RELEASE_TABLET | Freq: Every day | ORAL | Status: DC
  Administered 2015-05-26 – 2015-05-29 (×4): 40 mg via ORAL
  Filled 2015-05-25 (×4): qty 1

## 2015-05-25 MED ORDER — METHYLPREDNISOLONE SODIUM SUCC 125 MG IJ SOLR
125.0000 mg | Freq: Once | INTRAMUSCULAR | Status: AC
  Administered 2015-05-25: 125 mg via INTRAVENOUS
  Filled 2015-05-25: qty 2

## 2015-05-25 MED ORDER — LEVOFLOXACIN 500 MG PO TABS
500.0000 mg | ORAL_TABLET | Freq: Every day | ORAL | Status: DC
  Administered 2015-05-26 – 2015-05-29 (×4): 500 mg via ORAL
  Filled 2015-05-25 (×5): qty 1

## 2015-05-25 MED ORDER — VENLAFAXINE HCL ER 75 MG PO CP24
300.0000 mg | ORAL_CAPSULE | Freq: Every day | ORAL | Status: DC
  Administered 2015-05-26 – 2015-05-29 (×4): 300 mg via ORAL
  Filled 2015-05-25 (×4): qty 4

## 2015-05-25 MED ORDER — BENAZEPRIL HCL 10 MG PO TABS
10.0000 mg | ORAL_TABLET | Freq: Every day | ORAL | Status: DC
  Administered 2015-05-26 – 2015-05-29 (×4): 10 mg via ORAL
  Filled 2015-05-25 (×4): qty 1

## 2015-05-25 MED ORDER — GABAPENTIN 800 MG PO TABS
800.0000 mg | ORAL_TABLET | Freq: Four times a day (QID) | ORAL | Status: DC
  Filled 2015-05-25 (×4): qty 1

## 2015-05-25 MED ORDER — HYDROCODONE-ACETAMINOPHEN 5-325 MG PO TABS
1.0000 | ORAL_TABLET | Freq: Once | ORAL | Status: AC
  Administered 2015-05-25: 1 via ORAL
  Filled 2015-05-25: qty 1

## 2015-05-25 MED ORDER — SODIUM CHLORIDE 0.9 % IV SOLN: INTRAVENOUS | Status: DC

## 2015-05-25 MED ORDER — GABAPENTIN 400 MG PO CAPS
800.0000 mg | ORAL_CAPSULE | Freq: Four times a day (QID) | ORAL | Status: DC
  Administered 2015-05-25 – 2015-05-29 (×14): 800 mg via ORAL
  Filled 2015-05-25 (×16): qty 2

## 2015-05-25 NOTE — ED Provider Notes (Signed)
CSN: [id number]     Arrival date & time 05/25/15  1458 History   First MD Initiated Contact with Patient 05/25/15 1503     Chief Complaint  Patient presents with  . Shortness of Breath     (Consider location/radiation/quality/duration/timing/severity/associated sxs/prior Treatment) HPI Comments: 64 year old female with anxiety history, cigarette smoker for years, chronic bronchitis presents with worsening cough productive shortness of breath for the past week. Patient was seen earlier in the week and a chest x-ray was given cough medicine however symptoms worsening. Patient denies any diagnosis of COPD, no recent lung function testing done. No recent surgery, no blood clot history, no unilateral swelling. Definite productive cough. No chest pain. No oxygen at home. Nothing specifically is improved her symptoms. No weight changes.  Patient is a 64 y.o. female presenting with shortness of breath. The history is provided by the patient.  Shortness of Breath Associated symptoms: cough   Associated symptoms: no abdominal pain, no chest pain, no fever, no headaches, no neck pain, no rash and no vomiting     Past Medical History  Diagnosis Date  . Disc degeneration   . Hyperlipemia     takes Pravastatin daily  . Brain tumor (benign)   . Pinched nerve in shoulder   . Degenerative disc disease   . Hypertension     takes Lotrel daily  . Depression     takes Effexor daily  . GERD (gastroesophageal reflux disease)     takes Omeprazole daily  . Anxiety     takes Xanax daily  . History of bronchitis     uses inhaler daily  . Pneumonia 2012  . Numbness in both hands   . Joint pain   . Chronic back pain     bulding disc  . Osteoporosis     Fosamax weekly  . Hepatitis     Hep C  . History of colon polyps   . History of staph infection    Past Surgical History  Procedure Laterality Date  . Knee surgery Right   . Facial fracture surgery      in the late 90's  . Ankle fracture surgery  Bilateral early 2000's  . Tubal ligation    . Colonoscopy    . Orif calcaneous fracture Right 07/13/2013    Procedure: RIGHT OPEN REDUCTION INTERNAL FIXATION (ORIF) CALCANEUS FRACTURE;  Surgeon: Newt Minion, MD;  Location: [facility];  Service: Orthopedics;  Laterality: Right;   Family History  Problem Relation Age of Onset  . Bipolar disorder Daughter   . Drug abuse Son   . Drug abuse Grandchild   . Drug abuse Grandchild    History  Substance Use Topics  . Smoking status: Current Every Day Smoker -- 0.50 packs/day for 40 years    Types: Cigarettes  . Smokeless tobacco: Never Used  . Alcohol Use: No   OB History    Gravida Para Term Preterm AB TAB SAB Ectopic Multiple Living   3 2 2  1  1   2      Review of Systems  Constitutional: Negative for fever and chills.  HENT: Positive for congestion.   Eyes: Negative for visual disturbance.  Respiratory: Positive for cough and shortness of breath.   Cardiovascular: Negative for chest pain.  Gastrointestinal: Negative for vomiting and abdominal pain.  Genitourinary: Negative for dysuria and flank pain.  Musculoskeletal: Positive for arthralgias. Negative for back pain, neck pain and neck stiffness.  Skin: Negative for rash.  Neurological:  Negative for light-headedness and headaches.      Allergies  Review of patient's allergies indicates no known allergies.  Home Medications   Prior to Admission medications   Medication Sig Start Date End Date Taking? Authorizing Provider  albuterol (PROVENTIL HFA;VENTOLIN HFA) 108 (90 BASE) MCG/ACT inhaler Inhale 2 puffs into the lungs every 6 (six) hours as needed for wheezing.    Historical Provider, MD  alendronate (FOSAMAX) 70 MG tablet Take 70 mg by mouth every 7 (seven) days. On mondays. Take with a full glass of water on an empty stomach.     Historical Provider, MD  alprazolam Duanne Moron) 2 MG tablet Take 1 tablet (2 mg total) by mouth 3 (three) times daily. 04/10/15   Cloria Spring, MD   amLODipine-benazepril (LOTREL) 5-10 MG per capsule Take 1 capsule by mouth daily.    Historical Provider, MD  aspirin-acetaminophen-caffeine (EXCEDRIN MIGRAINE) [phone] MG per tablet Take 1 tablet by mouth every 6 (six) hours as needed for pain.    Historical Provider, MD  Calcium Carbonate-Vitamin D (CALCIUM + D PO) Take 1 tablet by mouth daily.     Historical Provider, MD  dexamethasone (DECADRON) 4 MG tablet Take 1 tablet (4 mg total) by mouth 2 (two) times daily with a meal. 05/19/15   Lily Kocher, PA-C  gabapentin (NEURONTIN) 800 MG tablet Take 800 mg by mouth 4 (four) times daily.    Historical Provider, MD  Nyoka Cowden Tea, Camillia sinensis, (GREEN TEA PO) Take 1 tablet by mouth daily.    Historical Provider, MD  HYDROcodone-homatropine (HYCODAN) 5-1.5 MG/5ML syrup Take 5 mLs by mouth every 6 (six) hours as needed. 05/19/15   Lily Kocher, PA-C  omeprazole (PRILOSEC) 20 MG capsule Take 20 mg by mouth daily.    Historical Provider, MD  oxyCODONE-acetaminophen (PERCOCET/ROXICET) 5-325 MG per tablet Take 1 tablet by mouth every 6 (six) hours as needed. Patient not taking: Reported on 05/19/2015 01/06/15   Milton Ferguson, MD  pravastatin (PRAVACHOL) 20 MG tablet Take 20 mg by mouth daily.    Historical Provider, MD  venlafaxine XR (EFFEXOR-XR) 150 MG 24 hr capsule Take 2 capsules (300 mg total) by mouth daily. 04/10/15   Cloria Spring, MD   BP 120/98 mmHg  Pulse 86  Temp(Src) 98.9 F (37.2 C) (Oral)  Resp 18  Ht 5\' 2"  (1.575 m)  Wt 150 lb (68.04 kg)  BMI 27.43 kg/m2  SpO2 94% Physical Exam  Constitutional: She is oriented to person, place, and time. She appears well-developed and well-nourished.  HENT:  Head: Normocephalic and atraumatic.  Eyes: Conjunctivae are normal. Right eye exhibits no discharge. Left eye exhibits no discharge.  Neck: Normal range of motion. Neck supple. No tracheal deviation present.  Cardiovascular: Normal rate and regular rhythm.   Pulmonary/Chest: Effort  normal. She has wheezes (expiratory bilateral).  Abdominal: Soft. She exhibits no distension. There is no tenderness. There is no guarding.  Musculoskeletal: She exhibits no edema.  Neurological: She is alert and oriented to person, place, and time.  Skin: Skin is warm. No rash noted.  Psychiatric: She has a normal mood and affect.  Nursing note and vitals reviewed.   ED Course  Procedures (including critical care time) Labs Review Labs Reviewed  CBC WITH DIFFERENTIAL/PLATELET - Abnormal; Notable for the following:    WBC 23.3 (*)    Neutrophils Relative % 83 (*)    Neutro Abs 19.3 (*)    Lymphocytes Relative 9 (*)    Monocytes Absolute 1.7 (*)  All other components within normal limits  BASIC METABOLIC PANEL - Abnormal; Notable for the following:    Potassium 3.4 (*)    Glucose, Bld 112 (*)    Calcium 8.6 (*)    All other components within normal limits    Imaging Review Dg Chest 2 View  05/25/2015   CLINICAL DATA:  Shortness of breath all week, chronic bronchitis, history hypertension, hepatitis, smoker  EXAM: CHEST  2 VIEW  COMPARISON:  05/19/2015  FINDINGS: Normal heart size, mediastinal contours, and pulmonary vascularity.  Lungs hyperinflated with minimal central peribronchial thickening.  Mild infiltrate or atelectasis at RIGHT base.  Remaining lungs clear.  No pleural effusion or pneumothorax.  Chronic compression deformity and sclerosis of T11 vertebra.  IMPRESSION: COPD changes with mild atelectasis or infiltrate at RIGHT base.   Electronically Signed   By: Lavonia Dana M.D.   On: 05/25/2015 15:46     EKG Interpretation None      MDM   Final diagnoses:  Wheezing  CAP (community acquired pneumonia)  Hypoxia   Pt presents with clinical bronchitis/ copd, O2 88% on arrival, nebs, steroids, labs. CXR pending.    Patient improved on recheck with nebulizer. Re-assessment of oxygenation showed 87% without nasal cannula. Recently in the ER patient was 97% without  auction. Discussed with hospitalist for admission who agreed. Anabiotic for likely pneumonia, clinically COPD exacerbation. Steroids given.  The patients results and plan were reviewed and discussed.   Any x-rays performed were personally reviewed by myself.   Differential diagnosis were considered with the presenting HPI.  Medications  cefTRIAXone (ROCEPHIN) 1 g in dextrose 5 % 50 mL IVPB (1 g Intravenous New Bag/Given 05/25/15 1656)  0.9 %  sodium chloride infusion (not administered)  methylPREDNISolone sodium succinate (SOLU-MEDROL) 125 mg/2 mL injection 125 mg (125 mg Intravenous Given 05/25/15 1535)  ipratropium-albuterol (DUONEB) 0.5-2.5 (3) MG/3ML nebulizer solution 3 mL (3 mLs Nebulization Given 05/25/15 1540)  azithromycin (ZITHROMAX) tablet 500 mg (500 mg Oral Given 05/25/15 1538)  ipratropium-albuterol (DUONEB) 0.5-2.5 (3) MG/3ML nebulizer solution 3 mL (3 mLs Nebulization Given 05/25/15 1633)  HYDROcodone-acetaminophen (NORCO/VICODIN) 5-325 MG per tablet 1 tablet (1 tablet Oral Given 05/25/15 1701)    Filed Vitals:   05/25/15 1501 05/25/15 1524 05/25/15 1541 05/25/15 1633  BP: 120/98     Pulse: 89 86    Temp: 98.9 F (37.2 C)     TempSrc: Oral     Resp: 17 18    Height: 5\' 2"  (1.575 m)     Weight: 150 lb (68.04 kg)     SpO2: 93% 93% 94% 87%    Final diagnoses:  Wheezing  CAP (community acquired pneumonia)  Hypoxia    Admission/ observation were discussed with the admitting physician, patient and/or family and they are comfortable with the plan.     Elnora Morrison, MD 05/25/15 [phone]

## 2015-05-25 NOTE — ED Notes (Signed)
Pt states that she was here about a week ago for cough and congestion and is feeling worse.  States that she is coughing up "green stuff", can't breathe good, and hurts all over.

## 2015-05-25 NOTE — H&P (Signed)
[facility] History and Physical  [name] ENI:[id number] DOB: 02-02-1951 DOA: 05/25/2015  Referring physician: ER PCP: Neale Burly, MD   Chief Complaint: Dyspnea. Productive cough.  HPI: [name] is a 64 y.o. female  This is a 64 year old lady who has COPD and is a smoker for several years of one pack of cigarettes per day who now presents with one-week history of dyspnea, worsening, associated with productive cough of yellow/green sputum. She apparently is not on any home oxygen. She denies any chest pain, fever, palpitations. She was in the emergency room a few days ago and was sent home with oral medications including oral steroid-dependent and antitussive medications. She has not improved. She now returns again. Evaluation in the emergency room shows that she is wheezing and a chest x-ray is suggestive possibly of an infiltrate. She is now being admitted for further management.   Review of Systems:  Apart from symptoms above, all systems negative.  Past Medical History  Diagnosis Date  . Disc degeneration   . Hyperlipemia     takes Pravastatin daily  . Brain tumor (benign)   . Pinched nerve in shoulder   . Degenerative disc disease   . Hypertension     takes Lotrel daily  . Depression     takes Effexor daily  . GERD (gastroesophageal reflux disease)     takes Omeprazole daily  . Anxiety     takes Xanax daily  . History of bronchitis     uses inhaler daily  . Pneumonia 2012  . Numbness in both hands   . Joint pain   . Chronic back pain     bulding disc  . Osteoporosis     Fosamax weekly  . Hepatitis     Hep C  . History of colon polyps   . History of staph infection    Past Surgical History  Procedure Laterality Date  . Knee surgery Right   . Facial fracture surgery      in the late 90's  . Ankle fracture surgery Bilateral early 2000's  . Tubal ligation    . Colonoscopy    . Orif calcaneous fracture Right 07/13/2013    Procedure:  RIGHT OPEN REDUCTION INTERNAL FIXATION (ORIF) CALCANEUS FRACTURE;  Surgeon: Newt Minion, MD;  Location: [place];  Service: Orthopedics;  Laterality: Right;   Social History:  reports that she has been smoking Cigarettes.  She has a 20 pack-year smoking history. She has never used smokeless tobacco. She reports that she does not drink alcohol or use illicit drugs.  No Known Allergies  Family History  Problem Relation Age of Onset  . Bipolar disorder Daughter   . Drug abuse Son   . Drug abuse Grandchild   . Drug abuse Grandchild     Prior to Admission medications   Medication Sig Start Date End Date Taking? Authorizing Provider  albuterol (PROVENTIL HFA;VENTOLIN HFA) 108 (90 BASE) MCG/ACT inhaler Inhale 2 puffs into the lungs every 6 (six) hours as needed for wheezing.   Yes Historical Provider, MD  alprazolam Duanne Moron) 2 MG tablet Take 1 tablet (2 mg total) by mouth 3 (three) times daily. Patient taking differently: Take 2 mg by mouth 3 (three) times daily as needed for anxiety.  04/10/15  Yes Cloria Spring, MD  amLODipine-benazepril (LOTREL) 5-10 MG per capsule Take 1 capsule by mouth daily.   Yes Historical Provider, MD  aspirin-acetaminophen-caffeine (EXCEDRIN MIGRAINE) [phone] MG per tablet Take 1 tablet by  mouth every 6 (six) hours as needed for pain.   Yes Historical Provider, MD  Calcium Carbonate-Vitamin D (CALCIUM + D PO) Take 1 tablet by mouth daily.    Yes Historical Provider, MD  dexamethasone (DECADRON) 4 MG tablet Take 1 tablet (4 mg total) by mouth 2 (two) times daily with a meal. 05/19/15  Yes Lily Kocher, PA-C  gabapentin (NEURONTIN) 800 MG tablet Take 800 mg by mouth 4 (four) times daily.   Yes Historical Provider, MD  Nyoka Cowden Tea, Camillia sinensis, (GREEN TEA PO) Take 1 tablet by mouth daily.   Yes Historical Provider, MD  omeprazole (PRILOSEC) 20 MG capsule Take 20 mg by mouth daily.   Yes Historical Provider, MD  pravastatin (PRAVACHOL) 20 MG tablet Take 20 mg by mouth  daily.   Yes Historical Provider, MD  venlafaxine XR (EFFEXOR-XR) 150 MG 24 hr capsule Take 2 capsules (300 mg total) by mouth daily. 04/10/15  Yes Cloria Spring, MD  alendronate (FOSAMAX) 70 MG tablet Take 70 mg by mouth every 7 (seven) days. On mondays. Take with a full glass of water on an empty stomach.     Historical Provider, MD  HYDROcodone-homatropine (HYCODAN) 5-1.5 MG/5ML syrup Take 5 mLs by mouth every 6 (six) hours as needed. 05/19/15   Lily Kocher, PA-C  oxyCODONE-acetaminophen (PERCOCET/ROXICET) 5-325 MG per tablet Take 1 tablet by mouth every 6 (six) hours as needed. Patient not taking: Reported on 05/19/2015 01/06/15   Milton Ferguson, MD   Physical Exam: Filed Vitals:   05/25/15 1541 05/25/15 1633 05/25/15 1730 05/25/15 1732  BP:    122/95  Pulse:   81 82  Temp:      TempSrc:      Resp:   17 19  Height:      Weight:      SpO2: 94% 87% 93% 92%    Wt Readings from Last 3 Encounters:  05/25/15 68.04 kg (150 lb)  05/19/15 68.04 kg (150 lb)  04/10/15 66.951 kg (147 lb 9.6 oz)    General:  Appears calm and comfortable. She does not have increased work of breathing at rest which is not toxic or septic. There is no peripheral or central cyanosis. Eyes: PERRL, normal lids, irises & conjunctiva ENT: grossly normal hearing, lips & tongue Neck: no LAD, masses or thyromegaly Cardiovascular: RRR, no m/r/g. No LE edema. Telemetry: SR, no arrhythmias  Respiratory: She has bilateral scattered wheezing, moderately tight. There are no crackles or bronchial breathing. Abdomen: soft, ntnd Skin: no rash or induration seen on limited exam Musculoskeletal: grossly normal tone BUE/BLE Psychiatric: grossly normal mood and affect, speech fluent and appropriate Neurologic: grossly non-focal.          Labs on Admission:  Basic Metabolic Panel:  Recent Labs Lab 05/25/15 1521  NA 141  K 3.4*  CL 104  CO2 28  GLUCOSE 112*  BUN 13  CREATININE 0.78  CALCIUM 8.6*   Liver Function  Tests: No results for input(s): AST, ALT, ALKPHOS, BILITOT, PROT, ALBUMIN in the last 168 hours. No results for input(s): LIPASE, AMYLASE in the last 168 hours. No results for input(s): AMMONIA in the last 168 hours. CBC:  Recent Labs Lab 05/25/15 1521  WBC 23.3*  NEUTROABS 19.3*  HGB 12.4  HCT 37.5  MCV 96.9  PLT 274   Cardiac Enzymes: No results for input(s): CKTOTAL, CKMB, CKMBINDEX, TROPONINI in the last 168 hours.  BNP (last 3 results) No results for input(s): BNP in the last 8760  hours.  ProBNP (last 3 results) No results for input(s): PROBNP in the last 8760 hours.  CBG: No results for input(s): GLUCAP in the last 168 hours.  Radiological Exams on Admission: Dg Chest 2 View  05/25/2015   CLINICAL DATA:  Shortness of breath all week, chronic bronchitis, history hypertension, hepatitis, smoker  EXAM: CHEST  2 VIEW  COMPARISON:  05/19/2015  FINDINGS: Normal heart size, mediastinal contours, and pulmonary vascularity.  Lungs hyperinflated with minimal central peribronchial thickening.  Mild infiltrate or atelectasis at RIGHT base.  Remaining lungs clear.  No pleural effusion or pneumothorax.  Chronic compression deformity and sclerosis of T11 vertebra.  IMPRESSION: COPD changes with mild atelectasis or infiltrate at RIGHT base.   Electronically Signed   By: Lavonia Dana M.D.   On: 05/25/2015 15:46      Assessment/Plan   1. COPD exacerbation. She will be treated with intravenous steroids  and oral antibodies. She'll be given broncho-dilators as needed and supplemental oxygen also. The chest x-ray to my eye does not show any clear pneumonia. The white count is elevated but she has been on oral sterile also for the last few days. 2. Generalized anxiety disorder. Continue with home medications.  Further recommendations will depend on patient's hospital progress.   Code Status: Full code  DVT Prophylaxis: Heparin  Family Communication: I discussed the plan with the patient  at the bedside.   Disposition Plan: Home when medically stable  Time spent: 45 minutes.  Doree Albee [facility] Pager [phone].

## 2015-05-26 DIAGNOSIS — J189 Pneumonia, unspecified organism: Secondary | ICD-10-CM | POA: Diagnosis not present

## 2015-05-26 DIAGNOSIS — J9601 Acute respiratory failure with hypoxia: Secondary | ICD-10-CM

## 2015-05-26 LAB — CBC
HEMATOCRIT: 39.5 % (ref 36.0–46.0)
Hemoglobin: 12.5 g/dL (ref 12.0–15.0)
MCH: 30.9 pg (ref 26.0–34.0)
MCHC: 31.6 g/dL (ref 30.0–36.0)
MCV: 97.8 fL (ref 78.0–100.0)
Platelets: 266 10*3/uL (ref 150–400)
RBC: 4.04 MIL/uL (ref 3.87–5.11)
RDW: 14.3 % (ref 11.5–15.5)
WBC: 21 10*3/uL — ABNORMAL HIGH (ref 4.0–10.5)

## 2015-05-26 LAB — COMPREHENSIVE METABOLIC PANEL
ALBUMIN: 3 g/dL — AB (ref 3.5–5.0)
ALT: 44 U/L (ref 14–54)
ANION GAP: 7 (ref 5–15)
AST: 28 U/L (ref 15–41)
Alkaline Phosphatase: 52 U/L (ref 38–126)
BUN: 11 mg/dL (ref 6–20)
CALCIUM: 8.6 mg/dL — AB (ref 8.9–10.3)
CO2: 28 mmol/L (ref 22–32)
Chloride: 108 mmol/L (ref 101–111)
Creatinine, Ser: 0.69 mg/dL (ref 0.44–1.00)
GFR calc Af Amer: >60 mL/min (ref >60)
Glucose, Bld: 248 mg/dL — ABNORMAL HIGH (ref 65–99)
POTASSIUM: 3.5 mmol/L (ref 3.5–5.1)
SODIUM: 143 mmol/L (ref 135–145)
Total Bilirubin: 0.3 mg/dL (ref 0.3–1.2)
Total Protein: 6.5 g/dL (ref 6.5–8.1)

## 2015-05-26 MED ORDER — GUAIFENESIN-DM 100-10 MG/5ML PO SYRP
5.0000 mL | ORAL_SOLUTION | ORAL | Status: DC | PRN
  Administered 2015-05-26: 5 mL via ORAL
  Filled 2015-05-26: qty 5

## 2015-05-26 MED ORDER — HYDROCODONE-ACETAMINOPHEN 5-325 MG PO TABS
1.0000 | ORAL_TABLET | Freq: Four times a day (QID) | ORAL | Status: DC | PRN
  Administered 2015-05-26 (×2): 1 via ORAL
  Administered 2015-05-27 – 2015-05-29 (×9): 2 via ORAL
  Administered 2015-05-29: 1 via ORAL
  Filled 2015-05-26: qty 1
  Filled 2015-05-26: qty 2
  Filled 2015-05-26: qty 1
  Filled 2015-05-26 (×8): qty 2
  Filled 2015-05-26: qty 1

## 2015-05-26 MED ORDER — METHYLPREDNISOLONE SODIUM SUCC 125 MG IJ SOLR
60.0000 mg | Freq: Four times a day (QID) | INTRAMUSCULAR | Status: DC
  Administered 2015-05-26 – 2015-05-28 (×8): 60 mg via INTRAVENOUS
  Filled 2015-05-26 (×8): qty 2

## 2015-05-26 MED ORDER — ALBUTEROL SULFATE (2.5 MG/3ML) 0.083% IN NEBU
2.5000 mg | INHALATION_SOLUTION | Freq: Every day | RESPIRATORY_TRACT | Status: DC
  Administered 2015-05-27: 2.5 mg via RESPIRATORY_TRACT
  Filled 2015-05-26 (×2): qty 3

## 2015-05-26 MED ORDER — NICOTINE 14 MG/24HR TD PT24
14.0000 mg | MEDICATED_PATCH | Freq: Every day | TRANSDERMAL | Status: DC
  Administered 2015-05-26 – 2015-05-29 (×4): 14 mg via TRANSDERMAL
  Filled 2015-05-26 (×4): qty 1

## 2015-05-26 NOTE — Progress Notes (Signed)
Entered pt's room due to tech notifying nurse pt was coughing significantly. Went to check pt. Pt had on oxygen, nurse raised the head of the bed. Wheezing and consistent coughing was noted so nurse phoned respiratory therapy per MD orders for nebulizer treatment. Notified MD about pt's cough. MD put in order for pt's cough. Entered room to give pt medication (Robitussin DM) as ordered by MD for cough. Respiratory therapist stated pt had her nasal cannula off. Pt stated, I had just taken it (her nasal cannula) off because I was coughing so horribly". Respiratory therapist educated pt on the importance of keeping her oxygen on. Nurse re-educated pt and administered medication per MD's order. Respiratory performed breathing treatment per MD's order.

## 2015-05-26 NOTE — Progress Notes (Signed)
Nurse called about pt complaining about being SOB upon arrival pt had o2 off and spo2 87% on room air pt coughing Treatment giving pt continue to cough and explained to pt if she is having SOB she needs to keep O2 on. Pt back on 4lpm cann after treatment finished

## 2015-05-26 NOTE — Plan of Care (Signed)
Problem: Phase I Progression Outcomes Goal: O2 sats > or equal 90% or at baseline Outcome: Progressing Educated pt on the need and use of oxygen. Pt stated this is new for her, she does not wear oxygen at home.  Goal: Hemodynamically stable Outcome: Progressing All vitals are stable excluding oxygen saturation. Pt's oxygen saturation without the nasal cannula goes below 90%. Educated pt on the importance of keeping her oxygen on and requesting breathing treatments when needed.  Goal: Pain controlled Outcome: Progressing Patient denies pain as of 05/26/2015 02:14

## 2015-05-26 NOTE — Progress Notes (Signed)
Inpatient Diabetes Program Recommendations  AACE/ADA: New Consensus Statement on Inpatient Glycemic Control (2013)  Target Ranges:  Prepandial:   less than 140 mg/dL      Peak postprandial:   less than 180 mg/dL (1-2 hours)      Critically ill patients:  140 - 180 mg/dL   Results for ELDONNA, NEUENFELDT (MRN [id number]) as of 05/26/2015 09:01  Ref. Range 05/25/2015 15:21 05/26/2015 05:47  Glucose Latest Ref Range: 65-99 mg/dL 112 (H) 248 (H)    Diabetes history: No Outpatient Diabetes medications: NA Current orders for Inpatient glycemic control: None  Inpatient Diabetes Program Recommendations Correction (SSI): Noted lab glucose of 248 mg/dl this morning. Patient is ordered steroids which is likely cause of hyperglycemia. While inpatient and ordered steroids,please consider ordering CBGs with Novolog correction scale ACHS.  Thanks, Barnie Alderman, RN, MSN, CCRN, CDE Diabetes Coordinator Inpatient Diabetes Program [phone] (Team Pager from [place] to [place]) [phone] ([place] office) [phone] [facility] office) [phone] [facility] office)

## 2015-05-26 NOTE — Care Management Note (Signed)
Case Management Note  Patient Details  Name: [name] MRN: [id number] Date of Birth: 09-Sep-1951  Subjective/Objective:                  Pt admitted from home with COPD. Pt lives with her ex husband and will return home at discharge. Pt is independent with ADL's but has a cane for prn use.  Action/Plan: Will need to assess need for home O2 prior to dsicharge. Will continue to follow for discharge planning needs.  Expected Discharge Date:                  Expected Discharge Plan:  Home/Self Care  In-House Referral:  NA  Discharge planning Services  CM Consult  Post Acute Care Choice:    Choice offered to:  Patient  DME Arranged:    DME Agency:     HH Arranged:    [place] Agency:     Status of Service:  In process, will continue to follow  Medicare Important Message Given:    Date Medicare IM Given:    Medicare IM give by:    Date Additional Medicare IM Given:    Additional Medicare Important Message give by:     If discussed at [place] of Stay Meetings, dates discussed:    Additional Comments:  Joylene Draft, RN 05/26/2015, 1:37 PM

## 2015-05-26 NOTE — Progress Notes (Signed)
[facility] PROGRESS NOTE  CINDE EBERT BOF:[id number] DOB: May 13, 1951 DOA: 05/25/2015 PCP: Neale Burly, MD  Assessment/Plan: Acute hypoxemic respiratory failure -Secondary to community-acquired pneumonia as well as a COPD exacerbation.  -Oxygen as required.  Community-acquired pneumonia -Continue Rocephin/azithromycin pending culture data.  COPD with acute exacerbation -Continue steroids but start titrating as she is clinically somewhat improved today.  Code Status: Full code Family Communication: Patient only Disposition Plan: Home when ready   Consultants:  None   Antibiotics:  Rocephin  Azithromycin   Subjective: Feels a little improved, however still feels short of breath.  Objective: Filed Vitals:   05/26/15 0658 05/26/15 0957 05/26/15 1033 05/26/15 1431  BP: 131/71  121/66 114/68  Pulse: 88   84  Temp: 97.9 F (36.6 C)   97.6 F (36.4 C)  TempSrc: Oral   Axillary  Resp: 20   20  Height:      Weight:      SpO2: 95% 92%  97%    Intake/Output Summary (Last 24 hours) at 05/26/15 1515 Last data filed at 05/26/15 1431  Gross per 24 hour  Intake    360 ml  Output   1100 ml  Net   -740 ml   Filed Weights   05/25/15 1501 05/25/15 1830  Weight: 68.04 kg (150 lb) 68.947 kg (152 lb)    Exam:   General:  Alert, awake, oriented 3  Cardiovascular: Regular rate and rhythm, no murmurs, rubs or gallops  Respiratory: Mild bilateral expiratory wheezing, mild rhonchi, no crackles  Abdomen: Soft, nontender, nondistended, positive bowel sounds  Extremities: No clubbing, cyanosis or edema, positive pedal pulses   Neurologic:  Grossly intact and nonfocal  Data Reviewed: Basic Metabolic Panel:  Recent Labs Lab 05/25/15 1521 05/26/15 0547  NA 141 143  K 3.4* 3.5  CL 104 108  CO2 28 28  GLUCOSE 112* 248*  BUN 13 11  CREATININE 0.78 0.69  CALCIUM 8.6* 8.6*   Liver Function Tests:  Recent Labs Lab 05/26/15 0547  AST 28    ALT 44  ALKPHOS 52  BILITOT 0.3  PROT 6.5  ALBUMIN 3.0*   No results for input(s): LIPASE, AMYLASE in the last 168 hours. No results for input(s): AMMONIA in the last 168 hours. CBC:  Recent Labs Lab 05/25/15 1521 05/26/15 0547  WBC 23.3* 21.0*  NEUTROABS 19.3*  --   HGB 12.4 12.5  HCT 37.5 39.5  MCV 96.9 97.8  PLT 274 266   Cardiac Enzymes: No results for input(s): CKTOTAL, CKMB, CKMBINDEX, TROPONINI in the last 168 hours. BNP (last 3 results) No results for input(s): BNP in the last 8760 hours.  ProBNP (last 3 results) No results for input(s): PROBNP in the last 8760 hours.  CBG: No results for input(s): GLUCAP in the last 168 hours.  No results found for this or any previous visit (from the past 240 hour(s)).   Studies: Dg Chest 2 View  05/25/2015   CLINICAL DATA:  Shortness of breath all week, chronic bronchitis, history hypertension, hepatitis, smoker  EXAM: CHEST  2 VIEW  COMPARISON:  05/19/2015  FINDINGS: Normal heart size, mediastinal contours, and pulmonary vascularity.  Lungs hyperinflated with minimal central peribronchial thickening.  Mild infiltrate or atelectasis at RIGHT base.  Remaining lungs clear.  No pleural effusion or pneumothorax.  Chronic compression deformity and sclerosis of T11 vertebra.  IMPRESSION: COPD changes with mild atelectasis or infiltrate at RIGHT base.   Electronically Signed   By: Elta Guadeloupe  Thornton Papas M.D.   On: 05/25/2015 15:46    Scheduled Meds: . [START ON 05/27/2015] albuterol  2.5 mg Nebulization Daily  . amLODipine  5 mg Oral Daily   And  . benazepril  10 mg Oral Daily  . gabapentin  800 mg Oral QID  . heparin  5,000 Units Subcutaneous 3 times per day  . levofloxacin  500 mg Oral Daily  . methylPREDNISolone (SOLU-MEDROL) injection  125 mg Intravenous Q6H  . pantoprazole  40 mg Oral Daily  . pravastatin  20 mg Oral Daily  . venlafaxine XR  300 mg Oral Daily   Continuous Infusions:   Active Problems:   Generalized anxiety  disorder   Acute bronchitis with COPD   COPD exacerbation    Time spent: 30 minutes. Greater than 50% of this time was spent in direct contact with the patient coordinating care.    Lelon Frohlich  [facility] Pager [phone]  If 7PM-7AM, please contact night-coverage at [email or web address], password [facility] 05/26/2015, 3:15 PM  LOS: 1 day

## 2015-05-26 NOTE — Progress Notes (Signed)
Paged MD about pt complaining of cough. Awaiting response.

## 2015-05-27 LAB — BASIC METABOLIC PANEL
Anion gap: 7 (ref 5–15)
BUN: 15 mg/dL (ref 6–20)
CALCIUM: 8.5 mg/dL — AB (ref 8.9–10.3)
CHLORIDE: 106 mmol/L (ref 101–111)
CO2: 28 mmol/L (ref 22–32)
Creatinine, Ser: 0.66 mg/dL (ref 0.44–1.00)
GFR calc non Af Amer: >60 mL/min (ref >60)
Glucose, Bld: 192 mg/dL — ABNORMAL HIGH (ref 65–99)
Potassium: 3.7 mmol/L (ref 3.5–5.1)
Sodium: 141 mmol/L (ref 135–145)

## 2015-05-27 LAB — CBC
HCT: 38.9 % (ref 36.0–46.0)
Hemoglobin: 12.4 g/dL (ref 12.0–15.0)
MCH: 31.2 pg (ref 26.0–34.0)
MCHC: 31.9 g/dL (ref 30.0–36.0)
MCV: 98 fL (ref 78.0–100.0)
PLATELETS: 285 10*3/uL (ref 150–400)
RBC: 3.97 MIL/uL (ref 3.87–5.11)
RDW: 14.2 % (ref 11.5–15.5)
WBC: 30.4 10*3/uL — AB (ref 4.0–10.5)

## 2015-05-27 MED ORDER — ALBUTEROL SULFATE (2.5 MG/3ML) 0.083% IN NEBU
2.5000 mg | INHALATION_SOLUTION | RESPIRATORY_TRACT | Status: DC
  Administered 2015-05-27 – 2015-05-28 (×7): 2.5 mg via RESPIRATORY_TRACT
  Filled 2015-05-27 (×7): qty 3

## 2015-05-27 NOTE — Progress Notes (Addendum)
[facility] PROGRESS NOTE  [name] LKG:[id number] DOB: 29-Dec-1950 DOA: 05/25/2015 PCP: Neale Burly, MD  Assessment/Plan: Acute hypoxemic respiratory failure -Secondary to community-acquired pneumonia top of COPD with acute exacerbation. -Continue supplemental oxygen as needed.  Community-acquired pneumonia -Continue Rocephin/azithromycin pending culture data.  COPD with acute exacerbation -Remains "tight" today, will not titrate Steroids at this point.  Leukocytosis -Significantly increased today, suspect on account of the steroid-induced. -Recheck CBC in a.m.  Code Status: Full code Family Communication: Patient only  Disposition Plan: Home when ready   Consultants:  None   Antibiotics:  Rocephin  Azithromycin   Subjective: Shortness of breath improved today but not at baseline, chest pain resolved  Objective: Filed Vitals:   05/27/15 0739 05/27/15 1130 05/27/15 1420 05/27/15 1539  BP:   142/75   Pulse:   91   Temp:   97.6 F (36.4 C)   TempSrc:   Oral   Resp:   20   Height:      Weight:      SpO2: 96% 95% 100% 90%    Intake/Output Summary (Last 24 hours) at 05/27/15 1558 Last data filed at 05/27/15 1334  Gross per 24 hour  Intake   1440 ml  Output   1400 ml  Net     40 ml   Filed Weights   05/25/15 1501 05/25/15 1830  Weight: 68.04 kg (150 lb) 68.947 kg (152 lb)    Exam:   General:  Alert, awake, oriented 3  Cardiovascular: Tachycardic, regular  Respiratory: Diffuse bilateral expiratory wheezing  Abdomen: Soft, nontender, nondistended, positive bowel sounds  Extremities: No clubbing, cyanosis or edema, positive pulses   Neurologic:  Nonfocal  Data Reviewed: Basic Metabolic Panel:  Recent Labs Lab 05/25/15 1521 05/26/15 0547 05/27/15 0506  NA 141 143 141  K 3.4* 3.5 3.7  CL 104 108 106  CO2 28 28 28   GLUCOSE 112* 248* 192*  BUN 13 11 15   CREATININE 0.78 0.69 0.66  CALCIUM 8.6* 8.6* 8.5*   Liver  Function Tests:  Recent Labs Lab 05/26/15 0547  AST 28  ALT 44  ALKPHOS 52  BILITOT 0.3  PROT 6.5  ALBUMIN 3.0*   No results for input(s): LIPASE, AMYLASE in the last 168 hours. No results for input(s): AMMONIA in the last 168 hours. CBC:  Recent Labs Lab 05/25/15 1521 05/26/15 0547 05/27/15 0506  WBC 23.3* 21.0* 30.4*  NEUTROABS 19.3*  --   --   HGB 12.4 12.5 12.4  HCT 37.5 39.5 38.9  MCV 96.9 97.8 98.0  PLT 274 266 285   Cardiac Enzymes: No results for input(s): CKTOTAL, CKMB, CKMBINDEX, TROPONINI in the last 168 hours. BNP (last 3 results) No results for input(s): BNP in the last 8760 hours.  ProBNP (last 3 results) No results for input(s): PROBNP in the last 8760 hours.  CBG: No results for input(s): GLUCAP in the last 168 hours.  No results found for this or any previous visit (from the past 240 hour(s)).   Studies: No results found.  Scheduled Meds: . albuterol  2.5 mg Nebulization Q4H  . amLODipine  5 mg Oral Daily   And  . benazepril  10 mg Oral Daily  . gabapentin  800 mg Oral QID  . heparin  5,000 Units Subcutaneous 3 times per day  . levofloxacin  500 mg Oral Daily  . methylPREDNISolone (SOLU-MEDROL) injection  60 mg Intravenous Q6H  . nicotine  14 mg Transdermal Daily  .  pantoprazole  40 mg Oral Daily  . pravastatin  20 mg Oral Daily  . venlafaxine XR  300 mg Oral Daily   Continuous Infusions:   Principal Problem:   Acute respiratory failure with hypoxia Active Problems:   CAP (community acquired pneumonia)   COPD exacerbation   Generalized anxiety disorder    Time spent: 30 minutes. Greater than 50% of this time was spent in direct contact with the patient coordinating care.    Lelon Frohlich  [facility] Pager [phone]  If 7PM-7AM, please contact night-coverage at [email or web address], password [facility] 05/27/2015, 3:58 PM  LOS: 2 days

## 2015-05-27 NOTE — Progress Notes (Signed)
Inpatient Diabetes Program Recommendations  AACE/ADA: New Consensus Statement on Inpatient Glycemic Control (2013)  Target Ranges:  Prepandial:   less than 140 mg/dL      Peak postprandial:   less than 180 mg/dL (1-2 hours)      Critically ill patients:  140 - 180 mg/dL   Results for [name], [name] (MRN [id number]) as of 05/27/2015 09:00  Ref. Range 05/26/2015 05:47 05/27/2015 05:06  Glucose Latest Ref Range: 65-99 mg/dL 248 (H) 192 (H)   Diabetes history: No Outpatient Diabetes medications: NA Current orders for Inpatient glycemic control: None  Inpatient Diabetes Program Recommendations Correction (SSI): Noted lab glucose of 248 mg/dl on 05/26/15 and 192 mg/dl this morning. Patient is ordered steroids which is likely cause of hyperglycemia. While inpatient and ordered steroids,please consider ordering CBGs with Novolog correction scale ACHS.  Thanks, Barnie Alderman, RN, MSN, CCRN, CDE Diabetes Coordinator Inpatient Diabetes Program [phone] (Team Pager from [place] to [place]) [phone] ([place] office) [phone] [facility] office) [phone] [facility] office)

## 2015-05-28 DIAGNOSIS — F17200 Nicotine dependence, unspecified, uncomplicated: Secondary | ICD-10-CM

## 2015-05-28 DIAGNOSIS — J9601 Acute respiratory failure with hypoxia: Secondary | ICD-10-CM

## 2015-05-28 DIAGNOSIS — J189 Pneumonia, unspecified organism: Principal | ICD-10-CM

## 2015-05-28 DIAGNOSIS — J441 Chronic obstructive pulmonary disease with (acute) exacerbation: Secondary | ICD-10-CM

## 2015-05-28 LAB — CBC
HEMATOCRIT: 37.8 % (ref 36.0–46.0)
Hemoglobin: 11.9 g/dL — ABNORMAL LOW (ref 12.0–15.0)
MCH: 30.8 pg (ref 26.0–34.0)
MCHC: 31.5 g/dL (ref 30.0–36.0)
MCV: 97.9 fL (ref 78.0–100.0)
Platelets: 314 10*3/uL (ref 150–400)
RBC: 3.86 MIL/uL — AB (ref 3.87–5.11)
RDW: 14.5 % (ref 11.5–15.5)
WBC: 27.6 10*3/uL — ABNORMAL HIGH (ref 4.0–10.5)

## 2015-05-28 LAB — GLUCOSE, CAPILLARY
GLUCOSE-CAPILLARY: 104 mg/dL — AB (ref 65–99)
Glucose-Capillary: 176 mg/dL — ABNORMAL HIGH (ref 65–99)

## 2015-05-28 MED ORDER — PREDNISONE 20 MG PO TABS
40.0000 mg | ORAL_TABLET | Freq: Every day | ORAL | Status: DC
  Administered 2015-05-29: 40 mg via ORAL
  Filled 2015-05-28: qty 2

## 2015-05-28 MED ORDER — INSULIN ASPART 100 UNIT/ML ~~LOC~~ SOLN
0.0000 [IU] | Freq: Three times a day (TID) | SUBCUTANEOUS | Status: DC
  Administered 2015-05-28: 2 [IU] via SUBCUTANEOUS

## 2015-05-28 NOTE — Progress Notes (Signed)
SATURATION QUALIFICATIONS: (This note is used to comply with regulatory documentation for home oxygen)  Patient Saturations on Room Air at Rest = 96%  Patient Saturations on Room Air while Ambulating = 90%  Patient Saturations on Room Air after Ambulating = 100%

## 2015-05-28 NOTE — Progress Notes (Signed)
  PROGRESS NOTE  [name] [id number]:[id number] DOB: 1951/12/08 DOA: 05/25/2015 PCP: Neale Burly, MD  Summary: 64 year old woman with long-time smoking history, chronic bronchitis, presented with increasing shortness of breath and productive cough. She was admitted for COPD exacerbation. COPD, pneumonia  Assessment/Plan: 1. Acute hypoxic respiratory failure, resolved. 2. COPD exacerbation. Improving. 3. Community acquired pneumonia. Improving rapidly. 4. Steroid-induced hyperglycemia. 5. Chronic bronchitis 6. Chronic back pain 7. Generalized anxiety disorder. 8. Tobacco dependence. Patient wants to stop smoking. Using a patch currently.   Overall much improved. Plan to change to oral steroids in the morning, continue antibiotics.  Sliding scale insulin  Home, plan for nebulizer at home, 6/9.  Code Status: full code DVT prophylaxis: heparin Family Communication: none present. Pt alert and understands plan. Disposition Plan: home  Murray Hodgkins, MD  [facility]  Pager [phone] If 7PM-7AM, please contact night-coverage at [email or web address], password [facility] 05/28/2015, 3:14 PM  LOS: 3 days   Consultants:    Procedures:    Antibiotics:  Ceftriaxone 6/5  Azithromycin 6/5  Levaquin 6/6 >>  HPI/Subjective: Overall feeling better. Still coughing. Breathing okay.  Objective: Filed Vitals:   05/28/15 0307 05/28/15 0531 05/28/15 1144 05/28/15 1400  BP:  128/66  138/83  Pulse:  89  88  Temp:  98.1 F (36.7 C)  98.3 F (36.8 C)  TempSrc:  Oral    Resp:  20  20  Height:      Weight:      SpO2: 92% 97% 94% 94%    Intake/Output Summary (Last 24 hours) at 05/28/15 1514 Last data filed at 05/28/15 1000  Gross per 24 hour  Intake   1080 ml  Output   1200 ml  Net   -120 ml     Filed Weights   05/25/15 1501 05/25/15 1830  Weight: 68.04 kg (150 lb) 68.947 kg (152 lb)    Exam:     Afebrile, vital signs stable, no hypoxia General:  Appears calm and  comfortable Cardiovascular: RRR, no m/r/g.  Respiratory: CTA bilaterally, no w/r/r. Normal respiratory effort. Psychiatric: grossly normal mood and affect, speech fluent and appropriate  New data reviewed:  Urine output 2600  WBC 27.6, improved. Hemoglobin stable 11.9.  Chest x-ray right basilar infiltrate  Pertinent data since admission:    Pending data:    Scheduled Meds: . albuterol  2.5 mg Nebulization Q4H  . amLODipine  5 mg Oral Daily   And  . benazepril  10 mg Oral Daily  . gabapentin  800 mg Oral QID  . heparin  5,000 Units Subcutaneous 3 times per day  . levofloxacin  500 mg Oral Daily  . methylPREDNISolone (SOLU-MEDROL) injection  60 mg Intravenous Q6H  . nicotine  14 mg Transdermal Daily  . pantoprazole  40 mg Oral Daily  . pravastatin  20 mg Oral Daily  . venlafaxine XR  300 mg Oral Daily   Continuous Infusions:   Principal Problem:   Acute respiratory failure with hypoxia Active Problems:   Generalized anxiety disorder   CAP (community acquired pneumonia)   COPD exacerbation   Tobacco dependence   Time spent 15 minutes

## 2015-05-29 LAB — GLUCOSE, CAPILLARY: Glucose-Capillary: 86 mg/dL (ref 65–99)

## 2015-05-29 MED ORDER — HYDROCODONE-ACETAMINOPHEN 5-325 MG PO TABS: 1.0000 | ORAL_TABLET | Freq: Four times a day (QID) | ORAL | Status: DC | PRN

## 2015-05-29 MED ORDER — ALPRAZOLAM 2 MG PO TABS: 2.0000 mg | ORAL_TABLET | Freq: Three times a day (TID) | ORAL | Status: DC | PRN

## 2015-05-29 MED ORDER — TIOTROPIUM BROMIDE MONOHYDRATE 18 MCG IN CAPS: 18.0000 ug | ORAL_CAPSULE | Freq: Every day | RESPIRATORY_TRACT | Status: DC

## 2015-05-29 MED ORDER — LEVOFLOXACIN 750 MG PO TABS: 750.0000 mg | ORAL_TABLET | Freq: Every day | ORAL | Status: DC

## 2015-05-29 MED ORDER — ALBUTEROL SULFATE (2.5 MG/3ML) 0.083% IN NEBU: 2.5000 mg | INHALATION_SOLUTION | Freq: Four times a day (QID) | RESPIRATORY_TRACT | Status: DC | PRN

## 2015-05-29 MED ORDER — PREDNISONE 10 MG PO TABS: ORAL_TABLET | ORAL | Status: DC

## 2015-05-29 NOTE — Care Management Note (Signed)
Case Management Note  Patient Details  Name: [name] MRN: [id number] Date of Birth: 1951/07/20  Subjective/Objective:                    Action/Plan:   Expected Discharge Date:                  Expected Discharge Plan:  Home/Self Care  In-House Referral:  NA  Discharge planning Services  CM Consult  Post Acute Care Choice:    Choice offered to:  Patient  DME Arranged:  Nebulizer machine DME Agency:  [place]:    [facility] Agency:     Status of Service:  Completed, signed off  Medicare Important Message Given:  Yes Date Medicare IM Given:  05/29/15 Medicare IM give by:  Brooke Gully, RN BSN CM Date Additional Medicare IM Given:    Additional Medicare Important Message give by:     If discussed at [place] of Stay Meetings, dates discussed:    Additional Comments: Pt discharged home today. Pts neb machine ordered from [facility] per pts choice. Referral called to [facility] and they will deliver machine to pts home. Pt is aware that she will need to obtain the medications from her pharmacy for the neb machine. Pt and pts nurse aware of discharge arrangements. [name] [place], RN 05/29/2015, 10:31 AM

## 2015-05-29 NOTE — Progress Notes (Signed)
  PROGRESS NOTE  [name] [id number] DOB: 03/29/1951 DOA: 05/25/2015 PCP: Neale Burly, MD  Summary: 64 year old woman with long-time smoking history, chronic bronchitis, presented with increasing shortness of breath and productive cough. She was admitted for COPD exacerbation. COPD, pneumonia  Assessment/Plan: 1. Acute hypoxic respiratory failure, resolved. 2. COPD exacerbation. Slowly improving, lungs clear, no hypoxia. 3. Community acquired pneumonia. Slowing improving, afebrile 4. Steroid-induced hyperglycemia. Resolved.  5. Leukocytosis, present on admission. Was on dexamethasone as an outpatient prescribed 5/31. 6. Chronic bronchitis 7. Chronic back pain 8. Generalized anxiety disorder. 9. Tobacco dependence. Patient wants to stop smoking. Using a patch currently.   Home on oral abx and steroid taper, home nebulizer  Home, plan for nebulizer at home, 6/9.  Start Spiriva  Consider outpatient PFTs  F/u leukocytosis/repeat CBC as an outpatient  Code Status: full code DVT prophylaxis: heparin Family Communication: none present. Pt alert and understands plan. Disposition Plan: home  Murray Hodgkins, MD  [facility]  Pager [phone] If 7PM-7AM, please contact night-coverage at [email or web address], password [facility] 05/29/2015, 9:22 AM  LOS: 4 days   Consultants:    Procedures:    Antibiotics:  Ceftriaxone 6/5  Azithromycin 6/5  Levaquin 6/6 >> 6/11  HPI/Subjective: Still coughing, a little SOB when ambulating. Slept well.  Objective: Filed Vitals:   05/28/15 2008 05/28/15 2159 05/29/15 0142 05/29/15 0555  BP:  131/75  150/84  Pulse:  82  83  Temp:  98.7 F (37.1 C)  97.8 F (36.6 C)  TempSrc:  Oral  Oral  Resp:  20  20  Height:      Weight:      SpO2: 92% 92% 94% 92%    Intake/Output Summary (Last 24 hours) at 05/29/15 0922 Last data filed at 05/28/15 1000  Gross per 24 hour  Intake    360 ml  Output      0 ml  Net    360 ml      Filed Weights   05/25/15 1501 05/25/15 1830  Weight: 68.04 kg (150 lb) 68.947 kg (152 lb)    Exam:    Afebrile, VSS, no hypoxia General:  Appears calm and comfortable Cardiovascular: RRR, no m/r/g.  Respiratory: CTA bilaterally, no w/r/r. Normal respiratory effort. Speaks in full sentences. Psychiatric: grossly normal mood and affect, speech fluent and appropriate  New data reviewed:  CBG stable  Pertinent data since admission:  Chest x-ray right basilar infiltrate  Pending data:    Scheduled Meds: . amLODipine  5 mg Oral Daily   And  . benazepril  10 mg Oral Daily  . gabapentin  800 mg Oral QID  . heparin  5,000 Units Subcutaneous 3 times per day  . insulin aspart  0-9 Units Subcutaneous TID WC  . levofloxacin  500 mg Oral Daily  . nicotine  14 mg Transdermal Daily  . pantoprazole  40 mg Oral Daily  . pravastatin  20 mg Oral Daily  . predniSONE  40 mg Oral Q breakfast  . venlafaxine XR  300 mg Oral Daily   Continuous Infusions:   Principal Problem:   Acute respiratory failure with hypoxia Active Problems:   Generalized anxiety disorder   CAP (community acquired pneumonia)   COPD exacerbation   Tobacco dependence

## 2015-05-29 NOTE — Progress Notes (Signed)
Patient states understanding of discharge instructions, prescription given. 

## 2015-05-29 NOTE — Discharge Summary (Signed)
Physician Discharge Summary  [name] ZLD:[id number] DOB: 12-31-1950 DOA: 05/25/2015  PCP: Neale Burly, MD  Admit date: 05/25/2015 Discharge date: 05/29/2015  Recommendations for Outpatient Follow-up:  1. Resolution of COPD exacerbation 2. Started on Spiriva. Consider outpatient PFTs. 3. Leukocytosis, suspect steroid-induced. Could consider repeat CBC as an outpatient.    Follow-up Information    Follow up with Delphina Cahill, MD On 06/02/2015.   Specialty:  Internal Medicine   Why:  keep already scheduled appointment   Contact information:    [place] [phone]      Discharge Diagnoses:  1. Acute hypoxic respiratory failure 2. COPD exacerbation 3. Community acquired pneumonia 4. Steroid-induced hyperglycemia 5. Leukocytosis, likely steroid-induced 6. Chronic bronchitis 7. Tobacco dependence.  Discharge Condition: improved Disposition: home  Diet recommendation: regular  Filed Weights   05/25/15 1501 05/25/15 1830  Weight: 68.04 kg (150 lb) 68.947 kg (152 lb)    History of present illness:  64 year old woman with long-time smoking history, chronic bronchitis, presented with increasing shortness of breath and productive cough. She was admitted for COPD exacerbation.  Hospital Course:  Ms. Grumbine was treated with steroids, antibiotics and broncho-dilators. Her condition rapidly improved, she was successfully weaned off oxygen and was stable for discharge. Hospitalization was uncomplicated. Individual issues as below.  1. Acute hypoxic respiratory failure, resolved. 2. COPD exacerbation. Slowly improving, lungs clear, no hypoxia. 3. Community acquired pneumonia. Slowing improving, afebrile 4. Steroid-induced hyperglycemia. Resolved.  5. Leukocytosis, present on admission. Was on dexamethasone as an outpatient prescribed 5/31. 6. Chronic bronchitis 7. Chronic back pain 8. Generalized anxiety disorder. 9. Tobacco dependence.  Patient wants to stop smoking. Using a patch currently.  Consultants:  none  Procedures:    Antibiotics:  Ceftriaxone 6/5  Azithromycin 6/5  Levaquin 6/6 >> 6/11  Discharge Instructions  Discharge Instructions    Activity as tolerated - No restrictions    Complete by:  As directed      Diet general    Complete by:  As directed      Discharge instructions    Complete by:  As directed   Call your physician or seek immediate medical attention for shortness of breath, fever, wheezing or worsening of condition.          Current Discharge Medication List    START taking these medications   Details  albuterol (PROVENTIL) (2.5 MG/3ML) 0.083% nebulizer solution Take 3 mLs (2.5 mg total) by nebulization every 6 (six) hours as needed for wheezing or shortness of breath. Qty: 90 mL, Refills: 0    HYDROcodone-acetaminophen (NORCO/VICODIN) 5-325 MG per tablet Take 1 tablet by mouth every 6 (six) hours as needed for moderate pain. Qty: 20 tablet, Refills: 0    levofloxacin (LEVAQUIN) 750 MG tablet Take 1 tablet (750 mg total) by mouth daily. Start 6/10 in AM Qty: 2 tablet, Refills: 0    predniSONE (DELTASONE) 10 MG tablet Take 40 mg by mouth daily for 3 days, then take 20 mg by mouth daily for 3 days, then take 10 mg by mouth daily for 3 days, then stop. Qty: 21 tablet, Refills: 0    tiotropium (SPIRIVA HANDIHALER) 18 MCG inhalation capsule Place 1 capsule (18 mcg total) into inhaler and inhale daily. Pharmacist please instruct in proper use. Qty: 30 capsule, Refills: 0      CONTINUE these medications which have CHANGED   Details  alprazolam (XANAX) 2 MG tablet Take 1 tablet (2 mg total) by mouth  3 (three) times daily as needed for anxiety.      CONTINUE these medications which have NOT CHANGED   Details  albuterol (PROVENTIL HFA;VENTOLIN HFA) 108 (90 BASE) MCG/ACT inhaler Inhale 2 puffs into the lungs every 6 (six) hours as needed for wheezing.    amLODipine-benazepril  (LOTREL) 5-10 MG per capsule Take 1 capsule by mouth daily.    aspirin-acetaminophen-caffeine (EXCEDRIN MIGRAINE) 250-250-65 MG per tablet Take 1 tablet by mouth every 6 (six) hours as needed for pain.    Calcium Carbonate-Vitamin D (CALCIUM + D PO) Take 1 tablet by mouth daily.     gabapentin (NEURONTIN) 800 MG tablet Take 800 mg by mouth 4 (four) times daily.    Green Tea, Camillia sinensis, (GREEN TEA PO) Take 1 tablet by mouth daily.    omeprazole (PRILOSEC) 20 MG capsule Take 20 mg by mouth daily.    pravastatin (PRAVACHOL) 20 MG tablet Take 20 mg by mouth daily.    venlafaxine XR (EFFEXOR-XR) 150 MG 24 hr capsule Take 2 capsules (300 mg total) by mouth daily. Qty: 60 capsule, Refills: 2    alendronate (FOSAMAX) 70 MG tablet Take 70 mg by mouth every 7 (seven) days. On mondays. Take with a full glass of water on an empty stomach.     HYDROcodone-homatropine (HYCODAN) 5-1.5 MG/5ML syrup Take 5 mLs by mouth every 6 (six) hours as needed. Qty: 150 mL, Refills: 0      STOP taking these medications     dexamethasone (DECADRON) 4 MG tablet      oxyCODONE-acetaminophen (PERCOCET/ROXICET) 5-325 MG per tablet        No Xanax was prescribed--updated to reflect how patient takes the medication (PRN, not scheduled).  No Known Allergies  The results of significant diagnostics from this hospitalization (including imaging, microbiology, ancillary and laboratory) are listed below for reference.    Significant Diagnostic Studies: Dg Chest 2 View  05/25/2015   CLINICAL DATA:  Shortness of breath all week, chronic bronchitis, history hypertension, hepatitis, smoker  EXAM: CHEST  2 VIEW  COMPARISON:  05/19/2015  FINDINGS: Normal heart size, mediastinal contours, and pulmonary vascularity.  Lungs hyperinflated with minimal central peribronchial thickening.  Mild infiltrate or atelectasis at RIGHT base.  Remaining lungs clear.  No pleural effusion or pneumothorax.  Chronic compression deformity  and sclerosis of T11 vertebra.  IMPRESSION: COPD changes with mild atelectasis or infiltrate at RIGHT base.   Electronically Signed   By: Lavonia Dana M.D.   On: 05/25/2015 15:46   Dg Chest 2 View  05/19/2015   CLINICAL DATA:  Cough and congestion for a few days.  EXAM: CHEST  2 VIEW  COMPARISON:  PA and lateral chest 11/25/2014 and 09/01/2012. Plain films thoracic spine 12/26/2014.  FINDINGS: The lungs are clear. Heart size is normal. No pneumothorax or pleural effusion. T11 compression fracture is identified as on comparison thoracic spine plain films.  IMPRESSION: No acute disease.   Electronically Signed   By: Inge Rise M.D.   On: 05/19/2015 12:43    Labs: Basic Metabolic Panel:  Recent Labs Lab 05/25/15 1521 05/26/15 0547 05/27/15 0506  NA 141 143 141  K 3.4* 3.5 3.7  CL 104 108 106  CO2 28 28 28   GLUCOSE 112* 248* 192*  BUN 13 11 15   CREATININE 0.78 0.69 0.66  CALCIUM 8.6* 8.6* 8.5*   Liver Function Tests:  Recent Labs Lab 05/26/15 0547  AST 28  ALT 44  ALKPHOS 52  BILITOT 0.3  PROT 6.5  ALBUMIN 3.0*   CBC:  Recent Labs Lab 05/25/15 1521 05/26/15 0547 05/27/15 0506 05/28/15 0649  WBC 23.3* 21.0* 30.4* 27.6*  NEUTROABS 19.3*  --   --   --   HGB 12.4 12.5 12.4 11.9*  HCT 37.5 39.5 38.9 37.8  MCV 96.9 97.8 98.0 97.9  PLT 274 266 285 314    CBG:  Recent Labs Lab 05/28/15 1626 05/28/15 2159 05/29/15 0745  GLUCAP 176* 104* 45    Principal Problem:   Acute respiratory failure with hypoxia Active Problems:   Generalized anxiety disorder   CAP (community acquired pneumonia)   COPD exacerbation   Tobacco dependence   Time coordinating discharge: 35 minutes  Signed:  Murray Hodgkins, MD [facility] 05/29/2015, 10:09 AM

## 2015-06-13 DIAGNOSIS — T40601A Poisoning by unspecified narcotics, accidental (unintentional), initial encounter: Secondary | ICD-10-CM

## 2015-06-13 HISTORY — DX: Poisoning by unspecified narcotics, accidental (unintentional), initial encounter: T40.601A

## 2015-06-15 ENCOUNTER — Emergency Department (HOSPITAL_COMMUNITY): Payer: Medicare Other

## 2015-06-15 ENCOUNTER — Inpatient Hospital Stay (HOSPITAL_COMMUNITY)
Admission: EM | Admit: 2015-06-15 | Discharge: 2015-06-17 | DRG: 917 | Disposition: A | Discharge status: Home / Self Care | Payer: Medicare Other | Attending: Internal Medicine | Admitting: Internal Medicine

## 2015-06-15 ENCOUNTER — Encounter (HOSPITAL_COMMUNITY): Payer: Self-pay | Admitting: Emergency Medicine

## 2015-06-15 DIAGNOSIS — Z87891 Personal history of nicotine dependence: Secondary | ICD-10-CM

## 2015-06-15 DIAGNOSIS — T404X1A Poisoning by other synthetic narcotics, accidental (unintentional), initial encounter: Secondary | ICD-10-CM | POA: Diagnosis not present

## 2015-06-15 DIAGNOSIS — K219 Gastro-esophageal reflux disease without esophagitis: Secondary | ICD-10-CM | POA: Diagnosis present

## 2015-06-15 DIAGNOSIS — G92 Toxic encephalopathy: Secondary | ICD-10-CM | POA: Diagnosis present

## 2015-06-15 DIAGNOSIS — I10 Essential (primary) hypertension: Secondary | ICD-10-CM | POA: Diagnosis present

## 2015-06-15 DIAGNOSIS — G8929 Other chronic pain: Secondary | ICD-10-CM | POA: Diagnosis present

## 2015-06-15 DIAGNOSIS — Z8619 Personal history of other infectious and parasitic diseases: Secondary | ICD-10-CM | POA: Diagnosis not present

## 2015-06-15 DIAGNOSIS — J441 Chronic obstructive pulmonary disease with (acute) exacerbation: Secondary | ICD-10-CM | POA: Diagnosis present

## 2015-06-15 DIAGNOSIS — J9601 Acute respiratory failure with hypoxia: Secondary | ICD-10-CM

## 2015-06-15 DIAGNOSIS — F17200 Nicotine dependence, unspecified, uncomplicated: Secondary | ICD-10-CM | POA: Diagnosis not present

## 2015-06-15 DIAGNOSIS — T40601A Poisoning by unspecified narcotics, accidental (unintentional), initial encounter: Secondary | ICD-10-CM | POA: Diagnosis present

## 2015-06-15 DIAGNOSIS — E872 Acidosis: Secondary | ICD-10-CM | POA: Diagnosis not present

## 2015-06-15 DIAGNOSIS — J9602 Acute respiratory failure with hypercapnia: Secondary | ICD-10-CM

## 2015-06-15 DIAGNOSIS — J449 Chronic obstructive pulmonary disease, unspecified: Secondary | ICD-10-CM

## 2015-06-15 DIAGNOSIS — R05 Cough: Secondary | ICD-10-CM

## 2015-06-15 DIAGNOSIS — F411 Generalized anxiety disorder: Secondary | ICD-10-CM | POA: Diagnosis present

## 2015-06-15 DIAGNOSIS — E785 Hyperlipidemia, unspecified: Secondary | ICD-10-CM | POA: Diagnosis present

## 2015-06-15 DIAGNOSIS — R0902 Hypoxemia: Secondary | ICD-10-CM

## 2015-06-15 DIAGNOSIS — Z86011 Personal history of benign neoplasm of the brain: Secondary | ICD-10-CM

## 2015-06-15 DIAGNOSIS — R4182 Altered mental status, unspecified: Secondary | ICD-10-CM | POA: Diagnosis not present

## 2015-06-15 DIAGNOSIS — R059 Cough, unspecified: Secondary | ICD-10-CM

## 2015-06-15 DIAGNOSIS — T40604A Poisoning by unspecified narcotics, undetermined, initial encounter: Secondary | ICD-10-CM

## 2015-06-15 LAB — COMPREHENSIVE METABOLIC PANEL WITH GFR
ALT: 21 U/L (ref 14–54)
AST: 23 U/L (ref 15–41)
Albumin: 3.4 g/dL — ABNORMAL LOW (ref 3.5–5.0)
Alkaline Phosphatase: 48 U/L (ref 38–126)
Anion gap: 6 (ref 5–15)
BUN: 11 mg/dL (ref 6–20)
CO2: 27 mmol/L (ref 22–32)
Calcium: 8.3 mg/dL — ABNORMAL LOW (ref 8.9–10.3)
Chloride: 103 mmol/L (ref 101–111)
Creatinine, Ser: 0.88 mg/dL (ref 0.44–1.00)
GFR calc Af Amer: >60 mL/min
GFR calc non Af Amer: >60 mL/min
Glucose, Bld: 95 mg/dL (ref 65–99)
Potassium: 3.9 mmol/L (ref 3.5–5.1)
Sodium: 136 mmol/L (ref 135–145)
Total Bilirubin: 0.4 mg/dL (ref 0.3–1.2)
Total Protein: 6.4 g/dL — ABNORMAL LOW (ref 6.5–8.1)

## 2015-06-15 LAB — CBC WITH DIFFERENTIAL/PLATELET
Basophils Absolute: 0 10*3/uL (ref 0.0–0.1)
Basophils Relative: 0 % (ref 0–1)
Eosinophils Absolute: 0.2 10*3/uL (ref 0.0–0.7)
Eosinophils Relative: 2 % (ref 0–5)
HCT: 36.8 % (ref 36.0–46.0)
Hemoglobin: 11.6 g/dL — ABNORMAL LOW (ref 12.0–15.0)
Lymphocytes Relative: 24 % (ref 12–46)
Lymphs Abs: 2.3 10*3/uL (ref 0.7–4.0)
MCH: 30.6 pg (ref 26.0–34.0)
MCHC: 31.5 g/dL (ref 30.0–36.0)
MCV: 97.1 fL (ref 78.0–100.0)
Monocytes Absolute: 0.8 10*3/uL (ref 0.1–1.0)
Monocytes Relative: 9 % (ref 3–12)
Neutro Abs: 6.3 10*3/uL (ref 1.7–7.7)
Neutrophils Relative %: 65 % (ref 43–77)
Platelets: 188 10*3/uL (ref 150–400)
RBC: 3.79 MIL/uL — ABNORMAL LOW (ref 3.87–5.11)
RDW: 15 % (ref 11.5–15.5)
WBC: 9.7 10*3/uL (ref 4.0–10.5)

## 2015-06-15 LAB — BLOOD GAS, ARTERIAL
Acid-Base Excess: 0 mmol/L (ref 0.0–2.0)
BICARBONATE: 28.1 meq/L — AB (ref 20.0–24.0)
DRAWN BY: 105551
FIO2: 0.28 %
O2 SAT: 85.8 %
PO2 ART: 58.4 mmHg — AB (ref 80.0–100.0)
Patient temperature: 98
TCO2: 26.4 mmol/L (ref 0–100)
pCO2 arterial: 66.3 mmHg (ref 35.0–45.0)
pH, Arterial: 7.251 — ABNORMAL LOW (ref 7.350–7.450)

## 2015-06-15 MED ORDER — VENLAFAXINE HCL ER 75 MG PO CP24
300.0000 mg | ORAL_CAPSULE | Freq: Every day | ORAL | Status: DC
  Administered 2015-06-16: 300 mg via ORAL
  Filled 2015-06-15: qty 4

## 2015-06-15 MED ORDER — PRAVASTATIN SODIUM 10 MG PO TABS
20.0000 mg | ORAL_TABLET | Freq: Every day | ORAL | Status: DC
  Administered 2015-06-16: 20 mg via ORAL
  Filled 2015-06-15: qty 2

## 2015-06-15 MED ORDER — SODIUM CHLORIDE 0.9 % IV SOLN
INTRAVENOUS | Status: DC
  Administered 2015-06-15 – 2015-06-16 (×2): via INTRAVENOUS

## 2015-06-15 MED ORDER — ONDANSETRON HCL 4 MG PO TABS: 4.0000 mg | ORAL_TABLET | Freq: Four times a day (QID) | ORAL | Status: DC | PRN

## 2015-06-15 MED ORDER — ENOXAPARIN SODIUM 40 MG/0.4ML ~~LOC~~ SOLN
40.0000 mg | SUBCUTANEOUS | Status: DC
  Administered 2015-06-16 (×2): 40 mg via SUBCUTANEOUS
  Filled 2015-06-15 (×2): qty 0.4

## 2015-06-15 MED ORDER — NALOXONE HCL 1 MG/ML IJ SOLN
INTRAMUSCULAR | Status: AC
Start: 2015-06-15 — End: 2015-06-15
  Filled 2015-06-15: qty 4

## 2015-06-15 MED ORDER — GABAPENTIN 800 MG PO TABS
800.0000 mg | ORAL_TABLET | Freq: Four times a day (QID) | ORAL | Status: DC
  Filled 2015-06-15 (×3): qty 1

## 2015-06-15 MED ORDER — CALCIUM CARBONATE-VITAMIN D 500-200 MG-UNIT PO TABS
1.0000 | ORAL_TABLET | Freq: Every day | ORAL | Status: DC
  Administered 2015-06-16: 1 via ORAL
  Filled 2015-06-15 (×3): qty 1

## 2015-06-15 MED ORDER — IPRATROPIUM-ALBUTEROL 0.5-2.5 (3) MG/3ML IN SOLN
3.0000 mL | Freq: Once | RESPIRATORY_TRACT | Status: AC
  Administered 2015-06-15: 3 mL via RESPIRATORY_TRACT
  Filled 2015-06-15: qty 3

## 2015-06-15 MED ORDER — ONDANSETRON HCL 4 MG/2ML IJ SOLN: 4.0000 mg | Freq: Four times a day (QID) | INTRAMUSCULAR | Status: DC | PRN

## 2015-06-15 MED ORDER — SODIUM CHLORIDE 0.9 % IV BOLUS (SEPSIS)
250.0000 mL | Freq: Once | INTRAVENOUS | Status: AC
  Administered 2015-06-15: 250 mL via INTRAVENOUS

## 2015-06-15 MED ORDER — POLYETHYLENE GLYCOL 3350 17 GM/SCOOP PO POWD
1.0000 | Freq: Once | ORAL | Status: DC
  Filled 2015-06-15: qty 255

## 2015-06-15 MED ORDER — ALENDRONATE SODIUM 70 MG PO TABS
70.0000 mg | ORAL_TABLET | ORAL | Status: DC
  Filled 2015-06-15: qty 1

## 2015-06-15 MED ORDER — PANTOPRAZOLE SODIUM 40 MG PO TBEC
40.0000 mg | DELAYED_RELEASE_TABLET | Freq: Every day | ORAL | Status: DC
  Administered 2015-06-16: 40 mg via ORAL
  Filled 2015-06-15: qty 1

## 2015-06-15 MED ORDER — NALOXONE HCL 1 MG/ML IJ SOLN
0.2500 mg/h | INTRAVENOUS | Status: DC
  Administered 2015-06-15: 0.25 mg/h via INTRAVENOUS
  Filled 2015-06-15: qty 4

## 2015-06-15 MED ORDER — SODIUM CHLORIDE 0.9 % IJ SOLN
3.0000 mL | Freq: Two times a day (BID) | INTRAMUSCULAR | Status: DC
Start: 2015-06-15 — End: 2015-06-17
  Administered 2015-06-16 (×3): 3 mL via INTRAVENOUS

## 2015-06-15 MED ORDER — NALOXONE HCL 0.4 MG/ML IJ SOLN
0.4000 mg | Freq: Once | INTRAMUSCULAR | Status: AC
  Administered 2015-06-15: 0.4 mg via INTRAVENOUS
  Filled 2015-06-15: qty 1

## 2015-06-15 NOTE — ED Notes (Signed)
CRITICAL VALUE ALERT  Critical value received:  ABG results PH 7.251 PCO2 66.3 PO2 58.4 Bicarb 28.1 Sat 85.8  Date of notification:  06/15/15  Time of notification:  2138  Critical value read back:Yes.    Nurse who received alert:  Derek Mound, RN  MD notified (1st page):  Rogene Houston  Time of first page:  2140  MD notified (2nd page):  Time of second page:  Responding MD:  [email or web address]  Time MD responded:  2140

## 2015-06-15 NOTE — ED Notes (Signed)
Patient c/o productive cough with thick green sputum. Patient unsure of any fevers. Per patient generalized fatigue. Per patient vomited and diarrhea last Sunday but none at this time.

## 2015-06-15 NOTE — H&P (Signed)
[facility] History and Physical  MALASHIA KAMAKA FBP:[id number] DOB: 10-Jul-1951    PCP:   Neale Burly, MD   Chief Complaint: cougn  HPI: [name] is an 64 y.o. female with hx of benign brain tumor, COPD, tobacco abuse, HTN, GERD, presented to the ER with a coughs.  She was recently admitted for CAP and COPD exacerbation.  Her CXR showed improvement compared to her previous CXR.  She came with her family, and per EDP, family member was concerned that she may have chewed her Fantanyl patch, though patient denied this.  She was found to be quite lethargic in the ER, and Narcan was given with improvement of her mental status.  Her ABG came back 7.25/66/POx= 58 on 28 percent.  She will be admitted into the ICU on narcan drip.  She was a little lethargy at this time, arousable, and maintained oral airway OK.   Hospitalist was asked to admit her for suspicious for narcotic overdose.   Rewiew of Systems:  Constitutional: unable.   Past Medical History  Diagnosis Date  . Disc degeneration   . Hyperlipemia     takes Pravastatin daily  . Brain tumor (benign)   . Pinched nerve in shoulder   . Degenerative disc disease   . Hypertension     takes Lotrel daily  . Depression     takes Effexor daily  . GERD (gastroesophageal reflux disease)     takes Omeprazole daily  . Anxiety     takes Xanax daily  . History of bronchitis     uses inhaler daily  . Pneumonia 2012  . Numbness in both hands   . Joint pain   . Chronic back pain     bulding disc  . Osteoporosis     Fosamax weekly  . Hepatitis     Hep C  . History of colon polyps   . History of staph infection     Past Surgical History  Procedure Laterality Date  . Knee surgery Right   . Facial fracture surgery      in the late 90's  . Ankle fracture surgery Bilateral early 2000's  . Tubal ligation    . Colonoscopy    . Orif calcaneous fracture Right 07/13/2013    Procedure: RIGHT OPEN REDUCTION INTERNAL  FIXATION (ORIF) CALCANEUS FRACTURE;  Surgeon: Newt Minion, MD;  Location: [place];  Service: Orthopedics;  Laterality: Right;    Medications:  HOME MEDS: Prior to Admission medications   Medication Sig Start Date End Date Taking? Authorizing Provider  albuterol (PROVENTIL HFA;VENTOLIN HFA) 108 (90 BASE) MCG/ACT inhaler Inhale 2 puffs into the lungs every 6 (six) hours as needed for wheezing.    Historical Provider, MD  albuterol (PROVENTIL) (2.5 MG/3ML) 0.083% nebulizer solution Take 3 mLs (2.5 mg total) by nebulization every 6 (six) hours as needed for wheezing or shortness of breath. 05/29/15   Samuella Cota, MD  alendronate (FOSAMAX) 70 MG tablet Take 70 mg by mouth every 7 (seven) days. On mondays. Take with a full glass of water on an empty stomach.     Historical Provider, MD  alprazolam Duanne Moron) 2 MG tablet Take 1 tablet (2 mg total) by mouth 3 (three) times daily as needed for anxiety. 05/29/15   Samuella Cota, MD  amLODipine-benazepril (LOTREL) 5-10 MG per capsule Take 1 capsule by mouth daily.    Historical Provider, MD  aspirin-acetaminophen-caffeine (EXCEDRIN MIGRAINE) [phone] MG per tablet Take 1 tablet by  mouth every 6 (six) hours as needed for pain.    Historical Provider, MD  Calcium Carbonate-Vitamin D (CALCIUM + D PO) Take 1 tablet by mouth daily.     Historical Provider, MD  gabapentin (NEURONTIN) 800 MG tablet Take 800 mg by mouth 4 (four) times daily.    Historical Provider, MD  Nyoka Cowden Tea, Camillia sinensis, (GREEN TEA PO) Take 1 tablet by mouth daily.    Historical Provider, MD  HYDROcodone-acetaminophen (NORCO/VICODIN) 5-325 MG per tablet Take 1 tablet by mouth every 6 (six) hours as needed for moderate pain. 05/29/15   Samuella Cota, MD  HYDROcodone-homatropine [facility]) 5-1.5 MG/5ML syrup Take 5 mLs by mouth every 6 (six) hours as needed. 05/19/15   Lily Kocher, PA-C  levofloxacin (LEVAQUIN) 750 MG tablet Take 1 tablet (750 mg total) by mouth daily. Start 6/10 in  AM 05/29/15   Samuella Cota, MD  omeprazole (PRILOSEC) 20 MG capsule Take 20 mg by mouth daily.    Historical Provider, MD  pravastatin (PRAVACHOL) 20 MG tablet Take 20 mg by mouth daily.    Historical Provider, MD  predniSONE (DELTASONE) 10 MG tablet Take 40 mg by mouth daily for 3 days, then take 20 mg by mouth daily for 3 days, then take 10 mg by mouth daily for 3 days, then stop. 05/29/15   Samuella Cota, MD  tiotropium (SPIRIVA HANDIHALER) 18 MCG inhalation capsule Place 1 capsule (18 mcg total) into inhaler and inhale daily. Pharmacist please instruct in proper use. 05/29/15   Samuella Cota, MD  venlafaxine XR (EFFEXOR-XR) 150 MG 24 hr capsule Take 2 capsules (300 mg total) by mouth daily. 04/10/15   Cloria Spring, MD     Allergies:  No Known Allergies  Social History:   reports that she quit smoking about 2 weeks ago. Her smoking use included Cigarettes. She has a 20 pack-year smoking history. She has never used smokeless tobacco. She reports that she does not drink alcohol or use illicit drugs.  Family History: Family History  Problem Relation Age of Onset  . Bipolar disorder Daughter   . Drug abuse Son   . Drug abuse Grandchild   . Drug abuse Grandchild      Physical Exam: Filed Vitals:   06/15/15 1841 06/15/15 1900 06/15/15 1907 06/15/15 2025  BP:  102/59  95/57  Pulse:  71  69  Temp:      TempSrc:      Resp:    12  Height:      Weight:      SpO2: 93% 94% 92% 92%   Blood pressure 95/57, pulse 69, temperature 98.2 F (36.8 C), temperature source Oral, resp. rate 12, height 5\' 2"  (1.575 m), weight 67.132 kg (148 lb), SpO2 92 %.  GEN:  Pleasant patient lying in the stretcher in no acute distress; cooperative with exam. But falling asleep frequently.  PSYCH:  alert and oriented x4; does not appear anxious or depressed; affect is appropriate. HEENT: Mucous membranes pink and anicteric; PERRLA; EOM intact; no cervical lymphadenopathy nor thyromegaly or carotid  bruit; no JVD; There were no stridor. Neck is very supple. Breasts:: Not examined CHEST WALL: No tenderness CHEST: Normal respiration, clear to auscultation bilaterally.  HEART: Regular rate and rhythm.  There are no murmur, rub, or gallops.   BACK: No kyphosis or scoliosis; no CVA tenderness ABDOMEN: soft and non-tender; no masses, no organomegaly, normal abdominal bowel sounds; no pannus; no intertriginous candida. There is no rebound  and no distention. Rectal Exam: Not done EXTREMITIES: No bone or joint deformity; age-appropriate arthropathy of the hands and knees; no edema; no ulcerations.  There is no calf tenderness. Genitalia: not examined PULSES: 2+ and symmetric SKIN: Normal hydration no rash or ulceration CNS: Cranial nerves 2-12 grossly intact no focal lateralizing neurologic deficit.  Speech is fluent; uvula elevated with phonation, facial symmetry and tongue midline. DTR are normal bilaterally, cerebella exam is intact, barbinski is negative and strengths are equaled bilaterally.  No sensory loss.   Labs on Admission:  Basic Metabolic Panel:  Recent Labs Lab 06/15/15 1822  NA 136  K 3.9  CL 103  CO2 27  GLUCOSE 95  BUN 11  CREATININE 0.88  CALCIUM 8.3*   Liver Function Tests:  Recent Labs Lab 06/15/15 1822  AST 23  ALT 21  ALKPHOS 48  BILITOT 0.4  PROT 6.4*  ALBUMIN 3.4*   No results for input(s): LIPASE, AMYLASE in the last 168 hours. No results for input(s): AMMONIA in the last 168 hours. CBC:  Recent Labs Lab 06/15/15 1822  WBC 9.7  NEUTROABS 6.3  HGB 11.6*  HCT 36.8  MCV 97.1  PLT 188   Cardiac Enzymes: No results for input(s): CKTOTAL, CKMB, CKMBINDEX, TROPONINI in the last 168 hours.  CBG: No results for input(s): GLUCAP in the last 168 hours.   Radiological Exams on Admission: Dg Chest 2 View  06/15/2015   CLINICAL DATA:  Productive cough.  Generalized fatigue.  EXAM: CHEST  2 VIEW  COMPARISON:  05/25/2015  FINDINGS: Improving  aeration at the right lung base with minimal residual linear opacities. Minimal linear opacity at the left lung base, likely atelectasis. No new consolidation to suggest pneumonia. Cardiomediastinal contours are unchanged. Decreased hyperinflation from prior, bronchial thickening persists. No pulmonary edema. No pleural effusion or pneumothorax. Compression deformity at T11, unchanged.  IMPRESSION: Improving right lung base aeration with minimal residual linear opacities, likely atelectasis or scarring. Minimal atelectasis at the left lung base.   Electronically Signed   By: Jeb Levering M.D.   On: 06/15/2015 20:16    EKG: Independently reviewed.    Assessment/Plan Present on Admission:  . Narcotic overdose . Generalized anxiety disorder . COPD exacerbation . Tobacco dependence  PLAN:  Will admit her to the ICU for suspicion of narcotic overdose.   Her ABG showed respiratory failure, but we will see if she would improve with the Narcan drip.  Will place her on Bipap if she doesn't become more awake. Will give her nebs for her COPD.  Given suspicion of fentanyl ingestion, will need to keep watch on her likely closer to 72 hours (6 half lives, 98 percent clear)  Poison control center recommended only 6 hours after discontinuation of Narcan drip.    Other plans as per orders.  Code Status: FULL Haskel Khan, MD. [facility] Pager [phone] 7pm to 7am.  06/15/2015, 10:04 PM

## 2015-06-15 NOTE — ED Provider Notes (Addendum)
CSN: [id number]     Arrival date & time 06/15/15  1743 History   First MD Initiated Contact with Patient 06/15/15 1811     Chief Complaint  Patient presents with  . Cough     (Consider location/radiation/quality/duration/timing/severity/associated sxs/prior Treatment) Patient is a 64 y.o. female presenting with cough. The history is provided by the patient and a relative. The history is limited by the condition of the patient.  Cough  level V caveat applies due to patient's altered mental status. Patient arrived with a family of 4. Patient is the mother of a daughter that was admitted for narcotic overdose chewing fentanyl. Patient came in with concerns for recurrent pneumonia but was very drowsy. Patient was requiring 2 L of oxygen to maintain oxygen sats in the low 90s. Patient not normally on oxygen. Patient does have a history of COPD. Was admitted June 5 for exacerbation of COPD and possible pneumonia. Patient did not have a good explanation for why she was so drowsy. Patient is also on Xanax. Patient denied any narcotic use. Patient was not observed doing any coughing.  Level V caveat applies to the history due to patient's altered mental status as stated above.  Past Medical History  Diagnosis Date  . Disc degeneration   . Hyperlipemia     takes Pravastatin daily  . Brain tumor (benign)   . Pinched nerve in shoulder   . Degenerative disc disease   . Hypertension     takes Lotrel daily  . Depression     takes Effexor daily  . GERD (gastroesophageal reflux disease)     takes Omeprazole daily  . Anxiety     takes Xanax daily  . History of bronchitis     uses inhaler daily  . Pneumonia 2012  . Numbness in both hands   . Joint pain   . Chronic back pain     bulding disc  . Osteoporosis     Fosamax weekly  . Hepatitis     Hep C  . History of colon polyps   . History of staph infection    Past Surgical History  Procedure Laterality Date  . Knee surgery Right   .  Facial fracture surgery      in the late 90's  . Ankle fracture surgery Bilateral early 2000's  . Tubal ligation    . Colonoscopy    . Orif calcaneous fracture Right 07/13/2013    Procedure: RIGHT OPEN REDUCTION INTERNAL FIXATION (ORIF) CALCANEUS FRACTURE;  Surgeon: Newt Minion, MD;  Location: [place];  Service: Orthopedics;  Laterality: Right;   Family History  Problem Relation Age of Onset  . Bipolar disorder Daughter   . Drug abuse Son   . Drug abuse Grandchild   . Drug abuse Grandchild    History  Substance Use Topics  . Smoking status: Former Smoker -- 0.50 packs/day for 40 years    Types: Cigarettes    Quit date: 06/01/2015  . Smokeless tobacco: Never Used  . Alcohol Use: No   OB History    Gravida Para Term Preterm AB TAB SAB Ectopic Multiple Living   3 2 2  1  1   2      Review of Systems  Unable to perform ROS Respiratory: Positive for cough.    level V caveat applies due to patient's altered mental status.    Allergies  Review of patient's allergies indicates no known allergies.  Home Medications   Prior to Admission medications  Medication Sig Start Date End Date Taking? Authorizing Provider  albuterol (PROVENTIL HFA;VENTOLIN HFA) 108 (90 BASE) MCG/ACT inhaler Inhale 2 puffs into the lungs every 6 (six) hours as needed for wheezing.    Historical Provider, MD  albuterol (PROVENTIL) (2.5 MG/3ML) 0.083% nebulizer solution Take 3 mLs (2.5 mg total) by nebulization every 6 (six) hours as needed for wheezing or shortness of breath. 05/29/15   Samuella Cota, MD  alendronate (FOSAMAX) 70 MG tablet Take 70 mg by mouth every 7 (seven) days. On mondays. Take with a full glass of water on an empty stomach.     Historical Provider, MD  alprazolam Duanne Moron) 2 MG tablet Take 1 tablet (2 mg total) by mouth 3 (three) times daily as needed for anxiety. 05/29/15   Samuella Cota, MD  amLODipine-benazepril (LOTREL) 5-10 MG per capsule Take 1 capsule by mouth daily.    Historical  Provider, MD  aspirin-acetaminophen-caffeine (EXCEDRIN MIGRAINE) [phone] MG per tablet Take 1 tablet by mouth every 6 (six) hours as needed for pain.    Historical Provider, MD  Calcium Carbonate-Vitamin D (CALCIUM + D PO) Take 1 tablet by mouth daily.     Historical Provider, MD  gabapentin (NEURONTIN) 800 MG tablet Take 800 mg by mouth 4 (four) times daily.    Historical Provider, MD  Nyoka Cowden Tea, Camillia sinensis, (GREEN TEA PO) Take 1 tablet by mouth daily.    Historical Provider, MD  HYDROcodone-acetaminophen (NORCO/VICODIN) 5-325 MG per tablet Take 1 tablet by mouth every 6 (six) hours as needed for moderate pain. 05/29/15   Samuella Cota, MD  HYDROcodone-homatropine [facility]) 5-1.5 MG/5ML syrup Take 5 mLs by mouth every 6 (six) hours as needed. 05/19/15   Lily Kocher, PA-C  levofloxacin (LEVAQUIN) 750 MG tablet Take 1 tablet (750 mg total) by mouth daily. Start 6/10 in AM 05/29/15   Samuella Cota, MD  omeprazole (PRILOSEC) 20 MG capsule Take 20 mg by mouth daily.    Historical Provider, MD  pravastatin (PRAVACHOL) 20 MG tablet Take 20 mg by mouth daily.    Historical Provider, MD  predniSONE (DELTASONE) 10 MG tablet Take 40 mg by mouth daily for 3 days, then take 20 mg by mouth daily for 3 days, then take 10 mg by mouth daily for 3 days, then stop. 05/29/15   Samuella Cota, MD  tiotropium (SPIRIVA HANDIHALER) 18 MCG inhalation capsule Place 1 capsule (18 mcg total) into inhaler and inhale daily. Pharmacist please instruct in proper use. 05/29/15   Samuella Cota, MD  venlafaxine XR (EFFEXOR-XR) 150 MG 24 hr capsule Take 2 capsules (300 mg total) by mouth daily. 04/10/15   Cloria Spring, MD   BP 95/57 mmHg  Pulse 69  Temp(Src) 98.2 F (36.8 C) (Oral)  Resp 12  Ht 5\' 2"  (1.575 m)  Wt 148 lb (67.132 kg)  BMI 27.06 kg/m2  SpO2 92% Physical Exam  Constitutional: She appears well-developed and well-nourished. No distress.  HENT:  Head: Normocephalic and atraumatic.   Mouth/Throat: Oropharynx is clear and moist.  Eyes: EOM are normal. Pupils are equal, round, and reactive to light.  Pupils small.  Neck: Normal range of motion.  Cardiovascular: Normal rate, regular rhythm and normal heart sounds.   No murmur heard. Pulmonary/Chest: Effort normal. No respiratory distress. She has no wheezes.  Abdominal: Soft. Bowel sounds are normal. There is no tenderness.  Musculoskeletal: Normal range of motion. She exhibits no edema.  Neurological: She exhibits normal muscle tone. Coordination  normal.  Somnolent and very drowsy.  Skin: Skin is warm. No rash noted.  Nursing note and vitals reviewed.   ED Course  Procedures (including critical care time) Labs Review Labs Reviewed  CBC WITH DIFFERENTIAL/PLATELET - Abnormal; Notable for the following:    RBC 3.79 (*)    Hemoglobin 11.6 (*)    All other components within normal limits  COMPREHENSIVE METABOLIC PANEL - Abnormal; Notable for the following:    Calcium 8.3 (*)    Total Protein 6.4 (*)    Albumin 3.4 (*)    All other components within normal limits  URINE RAPID DRUG SCREEN, HOSP PERFORMED  URINALYSIS, ROUTINE W REFLEX MICROSCOPIC (NOT AT [facility])  BLOOD GAS, ARTERIAL   Results for orders placed or performed during the hospital encounter of 06/15/15  CBC with Differential  Result Value Ref Range   WBC 9.7 4.0 - 10.5 K/uL   RBC 3.79 (L) 3.87 - 5.11 MIL/uL   Hemoglobin 11.6 (L) 12.0 - 15.0 g/dL   HCT 36.8 36.0 - 46.0 %   MCV 97.1 78.0 - 100.0 fL   MCH 30.6 26.0 - 34.0 pg   MCHC 31.5 30.0 - 36.0 g/dL   RDW 15.0 11.5 - 15.5 %   Platelets 188 150 - 400 K/uL   Neutrophils Relative % 65 43 - 77 %   Neutro Abs 6.3 1.7 - 7.7 K/uL   Lymphocytes Relative 24 12 - 46 %   Lymphs Abs 2.3 0.7 - 4.0 K/uL   Monocytes Relative 9 3 - 12 %   Monocytes Absolute 0.8 0.1 - 1.0 K/uL   Eosinophils Relative 2 0 - 5 %   Eosinophils Absolute 0.2 0.0 - 0.7 K/uL   Basophils Relative 0 0 - 1 %   Basophils Absolute 0.0  0.0 - 0.1 K/uL  Comprehensive metabolic panel  Result Value Ref Range   Sodium 136 135 - 145 mmol/L   Potassium 3.9 3.5 - 5.1 mmol/L   Chloride 103 101 - 111 mmol/L   CO2 27 22 - 32 mmol/L   Glucose, Bld 95 65 - 99 mg/dL   BUN 11 6 - 20 mg/dL   Creatinine, Ser 0.88 0.44 - 1.00 mg/dL   Calcium 8.3 (L) 8.9 - 10.3 mg/dL   Total Protein 6.4 (L) 6.5 - 8.1 g/dL   Albumin 3.4 (L) 3.5 - 5.0 g/dL   AST 23 15 - 41 U/L   ALT 21 14 - 54 U/L   Alkaline Phosphatase 48 38 - 126 U/L   Total Bilirubin 0.4 0.3 - 1.2 mg/dL   GFR calc non Af Amer >60 >60 mL/min   GFR calc Af Amer >60 >60 mL/min   Anion gap 6 5 - 15   Results for orders placed or performed during the hospital encounter of 06/15/15  CBC with Differential  Result Value Ref Range   WBC 9.7 4.0 - 10.5 K/uL   RBC 3.79 (L) 3.87 - 5.11 MIL/uL   Hemoglobin 11.6 (L) 12.0 - 15.0 g/dL   HCT 36.8 36.0 - 46.0 %   MCV 97.1 78.0 - 100.0 fL   MCH 30.6 26.0 - 34.0 pg   MCHC 31.5 30.0 - 36.0 g/dL   RDW 15.0 11.5 - 15.5 %   Platelets 188 150 - 400 K/uL   Neutrophils Relative % 65 43 - 77 %   Neutro Abs 6.3 1.7 - 7.7 K/uL   Lymphocytes Relative 24 12 - 46 %   Lymphs Abs 2.3 0.7 -  4.0 K/uL   Monocytes Relative 9 3 - 12 %   Monocytes Absolute 0.8 0.1 - 1.0 K/uL   Eosinophils Relative 2 0 - 5 %   Eosinophils Absolute 0.2 0.0 - 0.7 K/uL   Basophils Relative 0 0 - 1 %   Basophils Absolute 0.0 0.0 - 0.1 K/uL  Comprehensive metabolic panel  Result Value Ref Range   Sodium 136 135 - 145 mmol/L   Potassium 3.9 3.5 - 5.1 mmol/L   Chloride 103 101 - 111 mmol/L   CO2 27 22 - 32 mmol/L   Glucose, Bld 95 65 - 99 mg/dL   BUN 11 6 - 20 mg/dL   Creatinine, Ser 0.88 0.44 - 1.00 mg/dL   Calcium 8.3 (L) 8.9 - 10.3 mg/dL   Total Protein 6.4 (L) 6.5 - 8.1 g/dL   Albumin 3.4 (L) 3.5 - 5.0 g/dL   AST 23 15 - 41 U/L   ALT 21 14 - 54 U/L   Alkaline Phosphatase 48 38 - 126 U/L   Total Bilirubin 0.4 0.3 - 1.2 mg/dL   GFR calc non Af Amer >60 >60 mL/min   GFR  calc Af Amer >60 >60 mL/min   Anion gap 6 5 - 15     Imaging Review Dg Chest 2 View  06/15/2015   CLINICAL DATA:  Productive cough.  Generalized fatigue.  EXAM: CHEST  2 VIEW  COMPARISON:  05/25/2015  FINDINGS: Improving aeration at the right lung base with minimal residual linear opacities. Minimal linear opacity at the left lung base, likely atelectasis. No new consolidation to suggest pneumonia. Cardiomediastinal contours are unchanged. Decreased hyperinflation from prior, bronchial thickening persists. No pulmonary edema. No pleural effusion or pneumothorax. Compression deformity at T11, unchanged.  IMPRESSION: Improving right lung base aeration with minimal residual linear opacities, likely atelectasis or scarring. Minimal atelectasis at the left lung base.   Electronically Signed   By: Jeb Levering M.D.   On: 06/15/2015 20:16     EKG Interpretation   Date/Time:  Sunday June 15 2015 19:06:53 EDT Ventricular Rate:  68 PR Interval:  120 QRS Duration: 84 QT Interval:  402 QTC Calculation: 427 R Axis:   3 Text Interpretation:  Sinus rhythm Confirmed by Emelda Kohlbeck  MD, Shynice Sigel  [phone]) on 06/15/2015 9:12:04 PM       CRITICAL CARE Performed by: Fredia Sorrow Total critical care time: 30 Critical care time was exclusive of separately billable procedures and treating other patients. Critical care was necessary to treat or prevent imminent or life-threatening deterioration. Critical care was time spent personally by me on the following activities: development of treatment plan with patient and/or surrogate as well as nursing, discussions with consultants, evaluation of patient's response to treatment, examination of patient, obtaining history from patient or surrogate, ordering and performing treatments and interventions, ordering and review of laboratory studies, ordering and review of radiographic studies, pulse oximetry and re-evaluation of patient's condition.'  MDM   Final  diagnoses:  Cough  Narcotic overdose, undetermined intent, initial encounter  Hypoxia  Acute respiratory failure with hypoxia    As per patient's family patient most likely, hold the fentanyl patches and has been chewing on them. Patient with hypoxia normally not on oxygen. Patient felt that she had a cough and may have pneumonia. Chest x-rays negative for pneumonia and no wheezing. Off of oxygen patient's sats down into the 80s. Patient very somnolent. Patient given Narcan now very awake. Carol blood gases pending. Patient sats now up around 9596%  on the 2 L. Patient's labs without significant abnormality. Urinalysis and urine drug screen is still pending. Patient is the mother of patient that I admitted the just earlier for the same thing was significant narcotic overdose. Patient will require admission. We'll discuss with hospitalist.  Patient was treated for pneumonia and/or exacerbation of COPD on June 5. Patient's chest x-ray shows marked improvement.   Philip patients may not concern today is that hypoxia related to the probable fentanyl overdose. Other family member was chewing fentanyl patches. This patient is one of 4 family members that were seen by [place]. The other 2 were discharged home with both were seeking pain medications.  Because of the patient's significant response to Narcan will start on Narcan drip.  Fredia Sorrow, MD 06/15/15 2127   Addendum: Patient arterial blood gas done by respiratory shows a pH 7.25 PCO2 66 PO2 of 58. Patient technically with her story failure and hypoxia and the retention of carbon dioxide. Patient now that received Narcan will be started on Narcan drip is significant only alert enough to go on BiPAP. We'll reassess. To make sure she stays alert enough if not patient will require intubation. Discussed with hospitalist for admission will probably require step down admission. We never heard any wheezing patient is known to have a history of COPD. But  does not appear to be a COPD exacerbation.   Fredia Sorrow, MD 06/15/15 2143  Addendum: Patient with known history of COPD but has been doing any wheezing here. As stated above blood gas showed CO2 retention. As well as acidosis. Patient started on Narcan drip still somewhat somnolent but does show some improvement. Very concerned about patient's CO2 retention and therefore  ordered the patient to be started on BiPAP. Admitting hospitalist felt it give a trial off Bipap and held the order. Kathi Der how she does on the Narcan drip before starting Bipap. Instructed nurse to follow patient very carefully if she does not seem to be improving and becoming more awake To notify Dr. Truman Hayward,  patient may need to go on BiPAP. Patient is on currently on cardiac monitor. Patient is to be admitted by the hospitalist service. He is placing orders. Patient's current oxygen saturation on 2 L is 96%    Fredia Sorrow, MD 06/15/15 5093  Fredia Sorrow, MD 06/15/15 2207

## 2015-06-16 ENCOUNTER — Encounter (HOSPITAL_COMMUNITY): Payer: Self-pay | Admitting: *Deleted

## 2015-06-16 DIAGNOSIS — J449 Chronic obstructive pulmonary disease, unspecified: Secondary | ICD-10-CM

## 2015-06-16 DIAGNOSIS — J9602 Acute respiratory failure with hypercapnia: Secondary | ICD-10-CM

## 2015-06-16 DIAGNOSIS — E872 Acidosis: Secondary | ICD-10-CM

## 2015-06-16 DIAGNOSIS — J9601 Acute respiratory failure with hypoxia: Secondary | ICD-10-CM

## 2015-06-16 LAB — RAPID URINE DRUG SCREEN, HOSP PERFORMED
AMPHETAMINES: NOT DETECTED
BENZODIAZEPINES: POSITIVE — AB
Barbiturates: NOT DETECTED
COCAINE: NOT DETECTED
OPIATES: NOT DETECTED
Tetrahydrocannabinol: NOT DETECTED

## 2015-06-16 LAB — URINALYSIS, ROUTINE W REFLEX MICROSCOPIC
Bilirubin Urine: NEGATIVE
Glucose, UA: NEGATIVE mg/dL
KETONES UR: NEGATIVE mg/dL
LEUKOCYTES UA: NEGATIVE
Nitrite: NEGATIVE
PROTEIN: NEGATIVE mg/dL
Specific Gravity, Urine: <1.005 — ABNORMAL LOW (ref 1.005–1.030)
Urobilinogen, UA: 0.2 mg/dL (ref 0.0–1.0)
pH: 5.5 (ref 5.0–8.0)

## 2015-06-16 LAB — URINE MICROSCOPIC-ADD ON: Urine-Other: NONE SEEN

## 2015-06-16 LAB — MRSA PCR SCREENING: MRSA BY PCR: NEGATIVE

## 2015-06-16 MED ORDER — CETYLPYRIDINIUM CHLORIDE 0.05 % MT LIQD
7.0000 mL | Freq: Two times a day (BID) | OROMUCOSAL | Status: DC
  Administered 2015-06-16 (×2): 7 mL via OROMUCOSAL

## 2015-06-16 MED ORDER — GABAPENTIN 400 MG PO CAPS
800.0000 mg | ORAL_CAPSULE | Freq: Four times a day (QID) | ORAL | Status: DC
  Administered 2015-06-16 (×5): 800 mg via ORAL
  Filled 2015-06-16 (×5): qty 2

## 2015-06-16 MED ORDER — HYDROCOD POLST-CPM POLST ER 10-8 MG/5ML PO SUER
5.0000 mL | Freq: Once | ORAL | Status: AC
  Administered 2015-06-16: 5 mL via ORAL
  Filled 2015-06-16: qty 5

## 2015-06-16 MED ORDER — POLYETHYLENE GLYCOL 3350 17 G PO PACK: 17.0000 g | PACK | Freq: Once | ORAL | Status: DC

## 2015-06-16 MED ORDER — ALBUTEROL SULFATE (2.5 MG/3ML) 0.083% IN NEBU
2.5000 mg | INHALATION_SOLUTION | RESPIRATORY_TRACT | Status: DC | PRN
  Administered 2015-06-16 (×2): 2.5 mg via RESPIRATORY_TRACT
  Filled 2015-06-16 (×2): qty 3

## 2015-06-16 MED ORDER — ALPRAZOLAM 0.5 MG PO TABS
2.0000 mg | ORAL_TABLET | Freq: Three times a day (TID) | ORAL | Status: DC | PRN
  Administered 2015-06-16 (×2): 2 mg via ORAL
  Filled 2015-06-16 (×2): qty 4

## 2015-06-16 MED ORDER — TIOTROPIUM BROMIDE MONOHYDRATE 18 MCG IN CAPS
18.0000 ug | ORAL_CAPSULE | Freq: Every day | RESPIRATORY_TRACT | Status: DC
  Administered 2015-06-16 – 2015-06-17 (×2): 18 ug via RESPIRATORY_TRACT
  Filled 2015-06-16: qty 5

## 2015-06-16 NOTE — Progress Notes (Signed)

## 2015-06-16 NOTE — Progress Notes (Signed)
[place] Progress Note Patient Name: [name] DOB: November 07, 1951 MRN: [id number]   Date of Service  06/16/2015  HPI/Events of Note  Hypercarbic resp failure -due to narcotics - on narcan gtt  eICU Interventions  Appears table o camera Bipap prn   New ICU patient evaluation: This patient was evaluated by the [facility] team. I have reviewed relevant documentation including care plan & orders.   Intervention Category Evaluation Type: New Patient Evaluation  ALVA,RAKESH V. 06/16/2015, 12:39 AM

## 2015-06-16 NOTE — Progress Notes (Signed)
PROGRESS NOTE  [name] OEV:[id number] DOB: 03/07/1951 DOA: 06/15/2015 PCP: Neale Burly, MD  Summary: 64 year old woman with history of chronic back pain and anxiety, on oral narcotics and Xanax at home, presented with acute encephalopathy. Per chart review, multiple family members also seen in the emergency department yesterday, daughter admitted for chewing fentanyl patches and other family members "seeking pain medications".  Patient immediately improved with Narcan and was started on Narcan infusion and admitted for presumed fentanyl overdose. Clinical course overnight was uncomplicated, at this point fully awake and alert and denies self-harm. He does admit to buying fentanyl off the street and using transdermally.  Assessment/Plan: 1. Acute hypercapnic respiratory failure with acute respiratory acidosis. Presumably secondary to narcotic overdose. Appears clinically resolved at this point. 2. Hospitalized with discharge 6/9, acute hypoxic respiratory failure, COPD exacerbation, community-acquired pneumonia. Chest x-ray shows near resolution. No acute issues seen. 3. Chronic bronchitis. Appears stable. 4. Chronic back pain, stable. Requesting pain medication this morning. 5. Generalized anxiety disorder 6. Tobacco dependence   Overall much improved. Presumed fentanyl overdose. No history to suggest self-harm.   Plan discontinue Narcan infusion and monitor throughout the day for current sedation. Hold off on any pain medication at this point, patient understands.  May be able to go home 6/28 and mental status remains stable off Narcan.  Social work consult for substance abuse, social dynamics.  Code Status: full code DVT prophylaxis: Lovenox Family Communication: none. Patient alert and understands plan. Disposition Plan: home  Murray Hodgkins, MD  [facility]  Pager [phone] If 7PM-7AM, please contact night-coverage at [email or web address], password  [facility] 06/16/2015, 8:34 AM  LOS: 1 day   Consultants:    Procedures:    Antibiotics:    HPI/Subjective: Feels fine, only complaint is chronic back pain. "Can I have some pain medication".  Admits to using fentanyl transdermal (bought off street) yesterday. Denies chewing or ingestion. No SI or intent to harm. Doesn't remember how she got to hospital.  Breathing fine, no chest pain.  Objective: Filed Vitals:   06/16/15 0600 06/16/15 0730 06/16/15 0752 06/16/15 0800  BP: 123/76 147/71  146/72  Pulse: 75 81  83  Temp:   98.2 F (36.8 C)   TempSrc:   Oral   Resp: 23 25  28   Height:      Weight:      SpO2: 99% 90%  92%    Intake/Output Summary (Last 24 hours) at 06/16/15 0834 Last data filed at 06/16/15 0700  Gross per 24 hour  Intake 1292.85 ml  Output   1700 ml  Net -407.15 ml     Filed Weights   06/15/15 1806 06/15/15 2334 06/16/15 0500  Weight: 67.132 kg (148 lb) 67.4 kg (148 lb 9.4 oz) 67.5 kg (148 lb 13 oz)    Exam:      afebrile, tachypneic, vitals otherwise stable  General:  Appears calm and comfortable. Very awake and alert. Eyes: PERRL, normal lids, irises   ENT: grossly normal hearing, lips   Neck: no LAD, masses or thyromegaly Cardiovascular: RRR, no m/r/g. No LE edema. Telemetry: SR  Respiratory: CTA bilaterally, no w/r/r. Normal respiratory effort. Diminished breath sounds bilaterally. Abdomen: soft, ntnd Skin: no rash or induration noted Musculoskeletal: grossly normal tone BUE/BLE. Moves all extremities to command. Psychiatric: grossly normal mood and affect, speech fluent and appropriate Neurologic: grossly non-focal.  New data reviewed:  No new labs  Pertinent data since admission  ABG 7.25/66/58  Complete metabolic panel unremarkable  CBC  unremarkable, of note leukocytosis seen previous admission completely resolved. WBC 9.7.   urinalysis was negative  Urine drug screen positive for benzodiazepines, appropriate   chest  x-ray shows near resolution of right base infiltrate, no new acute abnormalities noted. Independently reviewed.  EKG independently reviewed: sinus rhythm, no acute changes  Pending data:    Scheduled Meds: . antiseptic oral rinse  7 mL Mouth Rinse BID  . calcium-vitamin D  1 tablet Oral Daily  . enoxaparin (LOVENOX) injection  40 mg Subcutaneous Q24H  . gabapentin  800 mg Oral QID  . pantoprazole  40 mg Oral Daily  . polyethylene glycol powder  1 Container Oral Once  . pravastatin  20 mg Oral Daily  . sodium chloride  3 mL Intravenous Q12H  . venlafaxine XR  300 mg Oral Daily   Continuous Infusions: . sodium chloride 100 mL/hr at 06/16/15 0657  . naLOXone [facility]) adult infusion for OVERDOSE 0.25 mg/hr (06/15/15 2147)    Principal Problem:   Narcotic overdose Active Problems:   Generalized anxiety disorder   Tobacco dependence   Acute respiratory failure with hypercapnia   Acute respiratory acidosis   COPD (chronic obstructive pulmonary disease)   Time spent 20 minutes

## 2015-06-16 NOTE — Care Management Note (Signed)
Case Management Note  Patient Details  Name: [name] MRN: [id number] Date of Birth: 1951/12/12  Expected Discharge Date:  06/18/15               Expected Discharge Plan:  Home/Self Care  In-House Referral:  Clinical Social Work  Discharge planning Services  CM Consult  Post Acute Care Choice:  NA Choice offered to:  NA  DME Arranged:    DME Agency:     HH Arranged:    [place] Agency:     Status of Service:     Medicare Important Message Given:    Date Medicare IM Given:    Medicare IM give by:    Date Additional Medicare IM Given:    Additional Medicare Important Message give by:     If discussed at [place] of Stay Meetings, dates discussed:    Additional Comments: Patient is from home, lives with ex-husband and grandson. Pt is independent with ADL's. Pt has  Neb machine if needed. Pt has no HH services or DME needs prior to admission. CSW consult for substance abuse, CSW is aware. Discharge anticipated 06/17/2015. No CM needs anticipated.  Sherald Barge, RN 06/16/2015, 2:19 PM

## 2015-06-17 DIAGNOSIS — T40601A Poisoning by unspecified narcotics, accidental (unintentional), initial encounter: Secondary | ICD-10-CM

## 2015-06-17 DIAGNOSIS — E872 Acidosis: Secondary | ICD-10-CM

## 2015-06-17 DIAGNOSIS — J9602 Acute respiratory failure with hypercapnia: Secondary | ICD-10-CM

## 2015-06-17 NOTE — Clinical Social Work Note (Signed)
CSW attempted to see pt early this morning, but pt not in room. Pt now has been d/c and left the floor.  [name], [place]

## 2015-06-17 NOTE — Discharge Summary (Signed)
Physician Discharge Summary  [name] UVO:[id number] DOB: 1951-09-15 DOA: 06/15/2015  PCP: Neale Burly, MD  Admit date: 06/15/2015 Discharge date: 06/17/2015  Time spent: 35 minutes  Recommendations for Outpatient Follow-up:  1. Follow-up with primary care doctor in 2-4 weeks, for back pain.  Discharge Diagnoses:  Principal Problem:   Narcotic overdose Active Problems:   Generalized anxiety disorder   Tobacco dependence   Acute respiratory failure with hypercapnia   Acute respiratory acidosis   COPD (chronic obstructive pulmonary disease)   Discharge Condition: stable  Diet recommendation: Stable  Filed Weights   06/15/15 2334 06/16/15 0500 06/17/15 0500  Weight: 67.4 kg (148 lb 9.4 oz) 67.5 kg (148 lb 13 oz) 67.7 kg (149 lb 4 oz)    History of present illness:  64 y.o. female with hx of benign brain tumor, COPD, tobacco abuse, HTN, GERD, presented to the ER with a coughs. She was recently admitted for CAP and COPD exacerbation. Her CXR showed improvement compared to her previous CXR. She came with her family, and per EDP, family member was concerned that she may have chewed her Fantanyl patch, though patient denied this. She was found to be quite lethargic in the ER, and Narcan was given with improvement of her mental status. Her ABG came back 7.25/66/POx= 58 on 28 percent. She will be admitted into the ICU on narcan drip. She was a little lethargy at this time, arousable, and maintained oral airway OK. Hospitalist was asked to admit her for suspicious for narcotic overdose.   Hospital Course:  Acute hypercarbic respiratory failure due to narcotic overdose: - Patient immediately improved with IV Narcan infusion was admitted to the stepdown. - Her course overnight was uncomplicated so Narcan was stopped she denied any suicidal ideation. She does admit to buying fentanyl on the street due to her back pain. - She was previously on up pain contract with her  primary care doctor which was voided due to unknown reasons.  History of Healthcare associated pneumonia Completed treatment as an outpatient.  Chronic back pain: - Requesting pain medications this morning. - I offered her tramadol she refused as it doesn't work for the pain. I have instructed her that she needs to return to her primary care doctor for narcotics. - Social worker consult to for substance abuse.   Procedures:  CXR  Consultations:  none  Discharge Exam: Filed Vitals:   06/17/15 0600  BP: 114/59  Pulse: 83  Temp:   Resp: 19    General: A&O x3 Cardiovascular: RRR Respiratory: good air movement CTA B/L  Discharge Instructions   Discharge Instructions    Diet - low sodium heart healthy    Complete by:  As directed      Increase activity slowly    Complete by:  As directed           Current Discharge Medication List    CONTINUE these medications which have NOT CHANGED   Details  albuterol (PROVENTIL HFA;VENTOLIN HFA) 108 (90 BASE) MCG/ACT inhaler Inhale 2 puffs into the lungs every 6 (six) hours as needed for wheezing.    alendronate (FOSAMAX) 70 MG tablet Take 70 mg by mouth every 7 (seven) days. On mondays. Take with a full glass of water on an empty stomach.     alprazolam (XANAX) 2 MG tablet Take 1 tablet (2 mg total) by mouth 3 (three) times daily as needed for anxiety.    amLODipine-benazepril (LOTREL) 5-10 MG per capsule Take 1 capsule by  mouth daily.    aspirin-acetaminophen-caffeine (EXCEDRIN MIGRAINE) 250-250-65 MG per tablet Take 1 tablet by mouth every 6 (six) hours as needed for pain.    Calcium Carbonate-Vitamin D (CALCIUM + D PO) Take 1 tablet by mouth daily.     gabapentin (NEURONTIN) 800 MG tablet Take 800 mg by mouth 4 (four) times daily.    Green Tea, Camillia sinensis, (GREEN TEA PO) Take 1 tablet by mouth daily.    omeprazole (PRILOSEC) 20 MG capsule Take 20 mg by mouth daily.    pravastatin (PRAVACHOL) 20 MG tablet Take  20 mg by mouth daily.    tiotropium (SPIRIVA HANDIHALER) 18 MCG inhalation capsule Place 1 capsule (18 mcg total) into inhaler and inhale daily. Pharmacist please instruct in proper use. Qty: 30 capsule, Refills: 0    venlafaxine XR (EFFEXOR-XR) 150 MG 24 hr capsule Take 2 capsules (300 mg total) by mouth daily. Qty: 60 capsule, Refills: 2    HYDROcodone-acetaminophen (NORCO/VICODIN) 5-325 MG per tablet Take 1 tablet by mouth every 6 (six) hours as needed for moderate pain. Qty: 20 tablet, Refills: 0      STOP taking these medications     albuterol (PROVENTIL) (2.5 MG/3ML) 0.083% nebulizer solution      HYDROcodone-homatropine (HYCODAN) 5-1.5 MG/5ML syrup      levofloxacin (LEVAQUIN) 750 MG tablet      predniSONE (DELTASONE) 10 MG tablet        No Known Allergies Follow-up Information    Follow up with HASANAJ,XAJE A, MD In 2 weeks.   Specialty:  Internal Medicine   Contact information:   [place] [id number] [phone]        The results of significant diagnostics from this hospitalization (including imaging, microbiology, ancillary and laboratory) are listed below for reference.    Significant Diagnostic Studies: Dg Chest 2 View  06/15/2015   CLINICAL DATA:  Productive cough.  Generalized fatigue.  EXAM: CHEST  2 VIEW  COMPARISON:  05/25/2015  FINDINGS: Improving aeration at the right lung base with minimal residual linear opacities. Minimal linear opacity at the left lung base, likely atelectasis. No new consolidation to suggest pneumonia. Cardiomediastinal contours are unchanged. Decreased hyperinflation from prior, bronchial thickening persists. No pulmonary edema. No pleural effusion or pneumothorax. Compression deformity at T11, unchanged.  IMPRESSION: Improving right lung base aeration with minimal residual linear opacities, likely atelectasis or scarring. Minimal atelectasis at the left lung base.   Electronically Signed   By: Jeb Levering M.D.    On: 06/15/2015 20:16   Dg Chest 2 View  05/25/2015   CLINICAL DATA:  Shortness of breath all week, chronic bronchitis, history hypertension, hepatitis, smoker  EXAM: CHEST  2 VIEW  COMPARISON:  05/19/2015  FINDINGS: Normal heart size, mediastinal contours, and pulmonary vascularity.  Lungs hyperinflated with minimal central peribronchial thickening.  Mild infiltrate or atelectasis at RIGHT base.  Remaining lungs clear.  No pleural effusion or pneumothorax.  Chronic compression deformity and sclerosis of T11 vertebra.  IMPRESSION: COPD changes with mild atelectasis or infiltrate at RIGHT base.   Electronically Signed   By: Lavonia Dana M.D.   On: 05/25/2015 15:46   Dg Chest 2 View  05/19/2015   CLINICAL DATA:  Cough and congestion for a few days.  EXAM: CHEST  2 VIEW  COMPARISON:  PA and lateral chest 11/25/2014 and 09/01/2012. Plain films thoracic spine 12/26/2014.  FINDINGS: The lungs are clear. Heart size is normal. No pneumothorax or pleural effusion. T11  compression fracture is identified as on comparison thoracic spine plain films.  IMPRESSION: No acute disease.   Electronically Signed   By: Inge Rise M.D.   On: 05/19/2015 12:43    Microbiology: Recent Results (from the past 240 hour(s))  MRSA PCR Screening     Status: None   Collection Time: 06/15/15 11:31 PM  Result Value Ref Range Status   MRSA by PCR NEGATIVE NEGATIVE Final    Comment:        The GeneXpert MRSA Assay (FDA approved for NASAL specimens only), is one component of a comprehensive MRSA colonization surveillance program. It is not intended to diagnose MRSA infection nor to guide or monitor treatment for MRSA infections.      Labs: Basic Metabolic Panel:  Recent Labs Lab 06/15/15 1822  NA 136  K 3.9  CL 103  CO2 27  GLUCOSE 95  BUN 11  CREATININE 0.88  CALCIUM 8.3*   Liver Function Tests:  Recent Labs Lab 06/15/15 1822  AST 23  ALT 21  ALKPHOS 48  BILITOT 0.4  PROT 6.4*  ALBUMIN 3.4*    No results for input(s): LIPASE, AMYLASE in the last 168 hours. No results for input(s): AMMONIA in the last 168 hours. CBC:  Recent Labs Lab 06/15/15 1822  WBC 9.7  NEUTROABS 6.3  HGB 11.6*  HCT 36.8  MCV 97.1  PLT 188   Cardiac Enzymes: No results for input(s): CKTOTAL, CKMB, CKMBINDEX, TROPONINI in the last 168 hours. BNP: BNP (last 3 results) No results for input(s): BNP in the last 8760 hours.  ProBNP (last 3 results) No results for input(s): PROBNP in the last 8760 hours.  CBG: No results for input(s): GLUCAP in the last 168 hours.     Signed:  Charlynne Cousins  [facility] 06/17/2015, 7:42 AM

## 2015-06-17 NOTE — Progress Notes (Signed)
D.c instructions reviewed with patient.  Verbalized understanding.  Pt dc'd to home with family. Schonewitz, Eulis Canner 06/17/2015

## 2015-06-23 ENCOUNTER — Emergency Department (HOSPITAL_COMMUNITY)
Admission: EM | Admit: 2015-06-23 | Discharge: 2015-06-23 | Disposition: A | Discharge status: Home / Self Care | Payer: Medicare Other | Attending: Emergency Medicine | Admitting: Emergency Medicine

## 2015-06-23 ENCOUNTER — Emergency Department (HOSPITAL_COMMUNITY): Payer: Medicare Other

## 2015-06-23 ENCOUNTER — Encounter (HOSPITAL_COMMUNITY): Payer: Self-pay | Admitting: Emergency Medicine

## 2015-06-23 DIAGNOSIS — Z87828 Personal history of other (healed) physical injury and trauma: Secondary | ICD-10-CM | POA: Insufficient documentation

## 2015-06-23 DIAGNOSIS — Z8619 Personal history of other infectious and parasitic diseases: Secondary | ICD-10-CM | POA: Insufficient documentation

## 2015-06-23 DIAGNOSIS — J441 Chronic obstructive pulmonary disease with (acute) exacerbation: Secondary | ICD-10-CM | POA: Diagnosis not present

## 2015-06-23 DIAGNOSIS — G8929 Other chronic pain: Secondary | ICD-10-CM | POA: Diagnosis not present

## 2015-06-23 DIAGNOSIS — M549 Dorsalgia, unspecified: Secondary | ICD-10-CM | POA: Diagnosis not present

## 2015-06-23 DIAGNOSIS — F419 Anxiety disorder, unspecified: Secondary | ICD-10-CM | POA: Diagnosis not present

## 2015-06-23 DIAGNOSIS — Z86010 Personal history of colonic polyps: Secondary | ICD-10-CM | POA: Diagnosis not present

## 2015-06-23 DIAGNOSIS — M810 Age-related osteoporosis without current pathological fracture: Secondary | ICD-10-CM | POA: Diagnosis not present

## 2015-06-23 DIAGNOSIS — F329 Major depressive disorder, single episode, unspecified: Secondary | ICD-10-CM | POA: Diagnosis not present

## 2015-06-23 DIAGNOSIS — Z79899 Other long term (current) drug therapy: Secondary | ICD-10-CM | POA: Diagnosis not present

## 2015-06-23 DIAGNOSIS — R05 Cough: Secondary | ICD-10-CM | POA: Diagnosis present

## 2015-06-23 DIAGNOSIS — Z86011 Personal history of benign neoplasm of the brain: Secondary | ICD-10-CM | POA: Insufficient documentation

## 2015-06-23 DIAGNOSIS — I10 Essential (primary) hypertension: Secondary | ICD-10-CM | POA: Insufficient documentation

## 2015-06-23 DIAGNOSIS — R062 Wheezing: Secondary | ICD-10-CM | POA: Insufficient documentation

## 2015-06-23 DIAGNOSIS — Z87891 Personal history of nicotine dependence: Secondary | ICD-10-CM | POA: Insufficient documentation

## 2015-06-23 DIAGNOSIS — E785 Hyperlipidemia, unspecified: Secondary | ICD-10-CM | POA: Diagnosis not present

## 2015-06-23 DIAGNOSIS — K219 Gastro-esophageal reflux disease without esophagitis: Secondary | ICD-10-CM | POA: Insufficient documentation

## 2015-06-23 DIAGNOSIS — R0602 Shortness of breath: Secondary | ICD-10-CM | POA: Diagnosis not present

## 2015-06-23 DIAGNOSIS — R059 Cough, unspecified: Secondary | ICD-10-CM

## 2015-06-23 HISTORY — DX: Poisoning by unspecified narcotics, accidental (unintentional), initial encounter: T40.601A

## 2015-06-23 MED ORDER — ALBUTEROL (5 MG/ML) CONTINUOUS INHALATION SOLN
10.0000 mg/h | INHALATION_SOLUTION | Freq: Once | RESPIRATORY_TRACT | Status: AC
  Administered 2015-06-23: 10 mg/h via RESPIRATORY_TRACT
  Filled 2015-06-23: qty 20

## 2015-06-23 MED ORDER — BENZONATATE 100 MG PO CAPS: 100.0000 mg | ORAL_CAPSULE | Freq: Three times a day (TID) | ORAL | Status: DC

## 2015-06-23 MED ORDER — PREDNISONE 20 MG PO TABS: 20.0000 mg | ORAL_TABLET | Freq: Two times a day (BID) | ORAL | Status: DC

## 2015-06-23 MED ORDER — PREDNISONE 50 MG PO TABS
60.0000 mg | ORAL_TABLET | Freq: Once | ORAL | Status: AC
  Administered 2015-06-23: 60 mg via ORAL
  Filled 2015-06-23 (×2): qty 1

## 2015-06-23 MED ORDER — BENZONATATE 100 MG PO CAPS
200.0000 mg | ORAL_CAPSULE | Freq: Once | ORAL | Status: AC
  Administered 2015-06-23: 200 mg via ORAL
  Filled 2015-06-23: qty 2

## 2015-06-23 NOTE — ED Notes (Signed)
Pt reports continued cough with greenish thick sputum. Pt also c/o back pain and headache.

## 2015-06-23 NOTE — ED Provider Notes (Signed)
CSN: [id number]     Arrival date & time 06/23/15  1612 History   First MD Initiated Contact with Patient 06/23/15 1712     Chief Complaint  Patient presents with  . Cough     HPI  Patient presents evaluation of a cough. States she's had a cough that she was diagnosed with pneumonia in June. Admitted with COPD exacerbation and right lower lobe infiltrate. Treated released. Presented here in the interval with narcotic overdose.. Had been chewing on fentanyl patches and required Narcan rescue. She presents complaining of cough requesting pain medicine for "back pain". Breath "all the time. States she has albuterol and nebulizer at home but has not used it today. When I ask her why not she states "I came here". Dry nonproductive cough. No watches. No fever. No chest pain.    Past Medical History  Diagnosis Date  . Disc degeneration   . Hyperlipemia     takes Pravastatin daily  . Brain tumor (benign)   . Pinched nerve in shoulder   . Degenerative disc disease   . Hypertension     takes Lotrel daily  . Depression     takes Effexor daily  . GERD (gastroesophageal reflux disease)     takes Omeprazole daily  . Anxiety     takes Xanax daily  . History of bronchitis     uses inhaler daily  . Pneumonia 2012  . Numbness in both hands   . Joint pain   . Chronic back pain     bulding disc  . Osteoporosis     Fosamax weekly  . Hepatitis     Hep C  . History of colon polyps   . History of staph infection   . Narcotic overdose 06/13/2015   Past Surgical History  Procedure Laterality Date  . Knee surgery Right   . Facial fracture surgery      in the late 90's  . Ankle fracture surgery Bilateral early 2000's  . Tubal ligation    . Colonoscopy    . Orif calcaneous fracture Right 07/13/2013    Procedure: RIGHT OPEN REDUCTION INTERNAL FIXATION (ORIF) CALCANEUS FRACTURE;  Surgeon: Newt Minion, MD;  Location: [facility];  Service: Orthopedics;  Laterality: Right;   Family History  Problem  Relation Age of Onset  . Bipolar disorder Daughter   . Drug abuse Son   . Drug abuse Grandchild   . Drug abuse Grandchild    History  Substance Use Topics  . Smoking status: Former Smoker -- 0.50 packs/day for 40 years    Types: Cigarettes    Quit date: 06/01/2015  . Smokeless tobacco: Former Systems developer  . Alcohol Use: No     Comment: denies   OB History    Gravida Para Term Preterm AB TAB SAB Ectopic Multiple Living   3 2 2  1  1   2      Review of Systems  Constitutional: Negative for fever, chills, diaphoresis, appetite change and fatigue.  HENT: Negative for mouth sores, sore throat and trouble swallowing.   Eyes: Negative for visual disturbance.  Respiratory: Positive for cough, shortness of breath and wheezing. Negative for chest tightness.   Cardiovascular: Negative for chest pain.  Gastrointestinal: Negative for nausea, vomiting, abdominal pain, diarrhea and abdominal distention.  Endocrine: Negative for polydipsia, polyphagia and polyuria.  Genitourinary: Negative for dysuria, frequency and hematuria.  Musculoskeletal: Positive for back pain. Negative for gait problem.  Skin: Negative for color change, pallor and  rash.  Neurological: Negative for dizziness, syncope, light-headedness and headaches.  Hematological: Does not bruise/bleed easily.  Psychiatric/Behavioral: Negative for behavioral problems and confusion.      Allergies  Review of patient's allergies indicates no known allergies.  Home Medications   Prior to Admission medications   Medication Sig Start Date End Date Taking? Authorizing Provider  albuterol (PROVENTIL HFA;VENTOLIN HFA) 108 (90 BASE) MCG/ACT inhaler Inhale 2 puffs into the lungs every 6 (six) hours as needed for wheezing.   Yes Historical Provider, MD  alendronate (FOSAMAX) 70 MG tablet Take 70 mg by mouth every 7 (seven) days. On mondays. Take with a full glass of water on an empty stomach.    Yes Historical Provider, MD  alprazolam Duanne Moron) 2  MG tablet Take 1 tablet (2 mg total) by mouth 3 (three) times daily as needed for anxiety. 05/29/15  Yes Samuella Cota, MD  amLODipine-benazepril (LOTREL) 5-10 MG per capsule Take 1 capsule by mouth daily.   Yes Historical Provider, MD  aspirin-acetaminophen-caffeine (EXCEDRIN MIGRAINE) [phone] MG per tablet Take 1 tablet by mouth every 6 (six) hours as needed for pain.   Yes Historical Provider, MD  Calcium Carbonate-Vitamin D (CALCIUM + D PO) Take 1 tablet by mouth daily.    Yes Historical Provider, MD  gabapentin (NEURONTIN) 800 MG tablet Take 800 mg by mouth 4 (four) times daily.   Yes Historical Provider, MD  Nyoka Cowden Tea, Camillia sinensis, (GREEN TEA PO) Take 1 tablet by mouth daily.   Yes Historical Provider, MD  omeprazole (PRILOSEC) 20 MG capsule Take 20 mg by mouth daily.   Yes Historical Provider, MD  pravastatin (PRAVACHOL) 20 MG tablet Take 20 mg by mouth daily.   Yes Historical Provider, MD  tiotropium (SPIRIVA HANDIHALER) 18 MCG inhalation capsule Place 1 capsule (18 mcg total) into inhaler and inhale daily. Pharmacist please instruct in proper use. 05/29/15  Yes Samuella Cota, MD  venlafaxine XR (EFFEXOR-XR) 150 MG 24 hr capsule Take 2 capsules (300 mg total) by mouth daily. 04/10/15  Yes Cloria Spring, MD  benzonatate (TESSALON) 100 MG capsule Take 1 capsule (100 mg total) by mouth every 8 (eight) hours. 06/23/15   Tanna Furry, MD  HYDROcodone-acetaminophen (NORCO/VICODIN) 5-325 MG per tablet Take 1 tablet by mouth every 6 (six) hours as needed for moderate pain. Patient not taking: Reported on 06/16/2015 05/29/15   Samuella Cota, MD  predniSONE (DELTASONE) 20 MG tablet Take 1 tablet (20 mg total) by mouth 2 (two) times daily with a meal. 06/23/15   Tanna Furry, MD   BP 144/84 mmHg  Pulse 75  Temp(Src) 98.4 F (36.9 C) (Oral)  Resp 24  Ht 5\' 2"  (1.575 m)  Wt 150 lb (68.04 kg)  BMI 27.43 kg/m2  SpO2 97% Physical Exam  Constitutional: She is oriented to person, place, and  time. She appears well-developed and well-nourished. No distress.  HENT:  Head: Normocephalic.  Eyes: Conjunctivae are normal. Pupils are equal, round, and reactive to light. No scleral icterus.  Neck: Normal range of motion. Neck supple. No thyromegaly present.  Cardiovascular: Normal rate and regular rhythm.  Exam reveals no gallop and no friction rub.   No murmur heard. Pulmonary/Chest: Effort normal. No respiratory distress. She has wheezes. She has no rales.  Wheezing in all fields. Mild prolongation. No increased work of breathing or respiratory distress.  Abdominal: Soft. Bowel sounds are normal. She exhibits no distension. There is no tenderness. There is no rebound.  Musculoskeletal: Normal  range of motion.  Neurological: She is alert and oriented to person, place, and time.  Skin: Skin is warm and dry. No rash noted.  Psychiatric: She has a normal mood and affect. Her behavior is normal.    ED Course  Procedures (including critical care time) Labs Review Labs Reviewed - No data to display  Imaging Review Dg Chest 2 View  06/23/2015   CLINICAL DATA:  Subacute onset of productive cough and mild shortness of breath. Initial encounter.  EXAM: CHEST  2 VIEW  COMPARISON:  Chest radiograph performed 06/15/2015  FINDINGS: The lungs are well-aerated. Pulmonary vascularity is at the upper limits of normal. Peribronchial thickening is noted. There is no evidence of pleural effusion or pneumothorax.  The heart is normal in size; the mediastinal contour is within normal limits. No acute osseous abnormalities are seen. Chronic left-sided rib deformities are seen. Chronic compression deformities are seen along the lower thoracic and upper lumbar spine.  IMPRESSION: Peribronchial thickening noted; lungs otherwise clear.   Electronically Signed   By: Garald Balding M.D.   On: 06/23/2015 18:09     EKG Interpretation None      MDM   Final diagnoses:  Cough    X-ray shows no acute  abnormalities. A Shiley symptomatic improvement after nebulizer. Repeat exam shows clearing lungs. Minimal wheeze. Plan is home, prednisone, Tessalon, complaints with current albuterol doses, primary care follow-up    Tanna Furry, MD 06/23/15 Einar Crow

## 2015-06-23 NOTE — ED Notes (Signed)
MD at bedside. 

## 2015-06-23 NOTE — Discharge Instructions (Signed)
Use your inhaler, and nebulizer as prescribed.  Cough, Adult  A cough is a reflex. It helps you clear your throat and airways. A cough can help heal your body. A cough can last 2 or 3 weeks (acute) or may last more than 8 weeks (chronic). Some common causes of a cough can include an infection, allergy, or a cold. HOME CARE  Only take medicine as told by your doctor.  If given, take your medicines (antibiotics) as told. Finish them even if you start to feel better.  Use a cold steam vaporizer or humidifier in your home. This can help loosen thick spit (secretions).  Sleep so you are almost sitting up (semi-upright). Use pillows to do this. This helps reduce coughing.  Rest as needed.  Stop smoking if you smoke. GET HELP RIGHT AWAY IF:  You have yellowish-white fluid (pus) in your thick spit.  Your cough gets worse.  Your medicine does not reduce coughing, and you are losing sleep.  You cough up blood.  You have trouble breathing.  Your pain gets worse and medicine does not help.  You have a fever. MAKE SURE YOU:   Understand these instructions.  Will watch your condition.  Will get help right away if you are not doing well or get worse. Document Released: 08/19/2011 Document Revised: 04/22/2014 Document Reviewed: 08/19/2011 [employer] Patient Information 2015 [place], [place]. This information is not intended to replace advice given to you by your health care provider. Make sure you discuss any questions you have with your health care provider.

## 2015-07-10 ENCOUNTER — Encounter (HOSPITAL_COMMUNITY): Payer: Self-pay | Admitting: Psychiatry

## 2015-07-10 ENCOUNTER — Ambulatory Visit (INDEPENDENT_AMBULATORY_CARE_PROVIDER_SITE_OTHER): Payer: Medicare Other | Admitting: Psychiatry

## 2015-07-10 ENCOUNTER — Ambulatory Visit (HOSPITAL_COMMUNITY): Payer: Self-pay | Admitting: Psychiatry

## 2015-07-10 VITALS — BP 95/65 | HR 78 | Ht 62.0 in | Wt 147.6 lb

## 2015-07-10 DIAGNOSIS — F332 Major depressive disorder, recurrent severe without psychotic features: Secondary | ICD-10-CM

## 2015-07-10 DIAGNOSIS — F411 Generalized anxiety disorder: Secondary | ICD-10-CM | POA: Diagnosis not present

## 2015-07-10 MED ORDER — VENLAFAXINE HCL ER 150 MG PO CP24: 300.0000 mg | ORAL_CAPSULE | Freq: Every day | ORAL | Status: DC

## 2015-07-10 MED ORDER — ALPRAZOLAM 2 MG PO TABS: 2.0000 mg | ORAL_TABLET | Freq: Three times a day (TID) | ORAL | Status: DC | PRN
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
## 2015-07-24 ENCOUNTER — Emergency Department (HOSPITAL_COMMUNITY): Payer: Medicare Other

## 2015-07-24 ENCOUNTER — Encounter (HOSPITAL_COMMUNITY): Payer: Self-pay | Admitting: Emergency Medicine

## 2015-07-24 ENCOUNTER — Emergency Department (HOSPITAL_COMMUNITY)
Admission: EM | Admit: 2015-07-24 | Discharge: 2015-07-24 | Disposition: A | Discharge status: Home / Self Care | Payer: Medicare Other | Attending: Emergency Medicine | Admitting: Emergency Medicine

## 2015-07-24 DIAGNOSIS — Z86010 Personal history of colonic polyps: Secondary | ICD-10-CM | POA: Diagnosis not present

## 2015-07-24 DIAGNOSIS — F329 Major depressive disorder, single episode, unspecified: Secondary | ICD-10-CM | POA: Insufficient documentation

## 2015-07-24 DIAGNOSIS — F419 Anxiety disorder, unspecified: Secondary | ICD-10-CM | POA: Insufficient documentation

## 2015-07-24 DIAGNOSIS — I10 Essential (primary) hypertension: Secondary | ICD-10-CM | POA: Insufficient documentation

## 2015-07-24 DIAGNOSIS — E785 Hyperlipidemia, unspecified: Secondary | ICD-10-CM | POA: Insufficient documentation

## 2015-07-24 DIAGNOSIS — M546 Pain in thoracic spine: Secondary | ICD-10-CM

## 2015-07-24 DIAGNOSIS — W08XXXA Fall from other furniture, initial encounter: Secondary | ICD-10-CM | POA: Insufficient documentation

## 2015-07-24 DIAGNOSIS — M810 Age-related osteoporosis without current pathological fracture: Secondary | ICD-10-CM | POA: Diagnosis not present

## 2015-07-24 DIAGNOSIS — Z87891 Personal history of nicotine dependence: Secondary | ICD-10-CM | POA: Insufficient documentation

## 2015-07-24 DIAGNOSIS — K219 Gastro-esophageal reflux disease without esophagitis: Secondary | ICD-10-CM | POA: Insufficient documentation

## 2015-07-24 DIAGNOSIS — Z79899 Other long term (current) drug therapy: Secondary | ICD-10-CM | POA: Insufficient documentation

## 2015-07-24 DIAGNOSIS — Z8619 Personal history of other infectious and parasitic diseases: Secondary | ICD-10-CM | POA: Insufficient documentation

## 2015-07-24 DIAGNOSIS — Y999 Unspecified external cause status: Secondary | ICD-10-CM | POA: Diagnosis not present

## 2015-07-24 DIAGNOSIS — Z8701 Personal history of pneumonia (recurrent): Secondary | ICD-10-CM | POA: Insufficient documentation

## 2015-07-24 DIAGNOSIS — W19XXXA Unspecified fall, initial encounter: Secondary | ICD-10-CM

## 2015-07-24 DIAGNOSIS — S3992XA Unspecified injury of lower back, initial encounter: Secondary | ICD-10-CM | POA: Diagnosis present

## 2015-07-24 DIAGNOSIS — Y929 Unspecified place or not applicable: Secondary | ICD-10-CM | POA: Insufficient documentation

## 2015-07-24 DIAGNOSIS — G8929 Other chronic pain: Secondary | ICD-10-CM | POA: Diagnosis not present

## 2015-07-24 DIAGNOSIS — Y939 Activity, unspecified: Secondary | ICD-10-CM | POA: Insufficient documentation

## 2015-07-24 MED ORDER — KETOROLAC TROMETHAMINE 60 MG/2ML IM SOLN
30.0000 mg | Freq: Once | INTRAMUSCULAR | Status: AC
  Administered 2015-07-24: 30 mg via INTRAMUSCULAR
  Filled 2015-07-24: qty 2

## 2015-07-24 MED ORDER — DIAZEPAM 5 MG PO TABS
5.0000 mg | ORAL_TABLET | Freq: Once | ORAL | Status: AC
  Administered 2015-07-24: 5 mg via ORAL
  Filled 2015-07-24: qty 1

## 2015-07-24 NOTE — ED Notes (Signed)
Per patient, states she fell backwards from stepping stool on her back-no LOC

## 2015-07-24 NOTE — Discharge Instructions (Signed)
Take 4 over the counter ibuprofen tablets 3 times a day or 2 over-the-counter naproxen tablets twice a day for pain.  Back Pain, Adult Low back pain is very common. About 1 in 5 people have back pain.The cause of low back pain is rarely dangerous. The pain often gets better over time.About half of people with a sudden onset of back pain feel better in just 2 weeks. About 8 in 10 people feel better by 6 weeks.  CAUSES Some common causes of back pain include:  Strain of the muscles or ligaments supporting the spine.  Wear and tear (degeneration) of the spinal discs.  Arthritis.  Direct injury to the back. DIAGNOSIS Most of the time, the direct cause of low back pain is not known.However, back pain can be treated effectively even when the exact cause of the pain is unknown.Answering your caregiver's questions about your overall health and symptoms is one of the most accurate ways to make sure the cause of your pain is not dangerous. If your caregiver needs more information, he or she may order lab work or imaging tests (X-rays or MRIs).However, even if imaging tests show changes in your back, this usually does not require surgery. HOME CARE INSTRUCTIONS For many people, back pain returns.Since low back pain is rarely dangerous, it is often a condition that people can learn to [facility] their own.   Remain active. It is stressful on the back to sit or stand in one place. Do not sit, drive, or stand in one place for more than 30 minutes at a time. Take short walks on level surfaces as soon as pain allows.Try to increase the length of time you walk each day.  Do not stay in bed.Resting more than 1 or 2 days can delay your recovery.  Do not avoid exercise or work.Your body is made to move.It is not dangerous to be active, even though your back may hurt.Your back will likely heal faster if you return to being active before your pain is gone.  Pay attention to your body when you bend and  lift. Many people have less discomfortwhen lifting if they bend their knees, keep the load close to their bodies,and avoid twisting. Often, the most comfortable positions are those that put less stress on your recovering back.  Find a comfortable position to sleep. Use a firm mattress and lie on your side with your knees slightly bent. If you lie on your back, put a pillow under your knees.  Only take over-the-counter or prescription medicines as directed by your caregiver. Over-the-counter medicines to reduce pain and inflammation are often the most helpful.Your caregiver may prescribe muscle relaxant drugs.These medicines help dull your pain so you can more quickly return to your normal activities and healthy exercise.  Put ice on the injured area.  Put ice in a plastic bag.  Place a towel between your skin and the bag.  Leave the ice on for 15-20 minutes, 03-04 times a day for the first 2 to 3 days. After that, ice and heat may be alternated to reduce pain and spasms.  Ask your caregiver about trying back exercises and gentle massage. This may be of some benefit.  Avoid feeling anxious or stressed.Stress increases muscle tension and can worsen back pain.It is important to recognize when you are anxious or stressed and learn ways to manage it.Exercise is a great option. SEEK MEDICAL CARE IF:  You have pain that is not relieved with rest or medicine.  You  have pain that does not improve in 1 week.  You have new symptoms.  You are generally not feeling well. SEEK IMMEDIATE MEDICAL CARE IF:   You have pain that radiates from your back into your legs.  You develop new bowel or bladder control problems.  You have unusual weakness or numbness in your arms or legs.  You develop nausea or vomiting.  You develop abdominal pain.  You feel faint. Document Released: 12/06/2005 Document Revised: 06/06/2012 Document Reviewed: 04/09/2014 [facility] Patient Information 2015 [place],  [place]. This information is not intended to replace advice given to you by your health care provider. Make sure you discuss any questions you have with your health care provider.

## 2015-07-24 NOTE — ED Provider Notes (Signed)
CSN: [id number]     Arrival date & time 07/24/15  1239 History   First MD Initiated Contact with Patient 07/24/15 1259     Chief Complaint  Patient presents with  . Back Pain     (Consider location/radiation/quality/duration/timing/severity/associated sxs/prior Treatment) Patient is a 64 y.o. female presenting with back pain. The history is provided by the patient.  Back Pain Location:  Thoracic spine Quality:  Aching and stiffness Stiffness is present:  All day Radiates to:  Does not radiate Pain severity:  Moderate Onset quality:  Sudden Duration:  1 day Timing:  Constant Progression:  Worsening Chronicity:  Recurrent Context: falling   Relieved by:  Nothing Worsened by:  Movement, coughing and bending Ineffective treatments:  None tried Associated symptoms: no bladder incontinence, no bowel incontinence, no chest pain, no dysuria, no fever, no headaches, no leg pain, no numbness and no perianal numbness   Risk factors: lack of exercise   Risk factors: no recent surgery     64 yo F with a chief complaint of a fall. Patient says she was on a stepladder to recheck liver top cabinets when she slipped off the third step and landed flat on her back. Patient had the wind knocked out of her. Patient denies loss of consciousness denies neck pain. Patient's pain is right about T8. Patient states that that pain radiates a little bit to her left side. No noted rib tenderness as far she can tell. This happened yesterday. Patient has gotten progressively worse. Patient denies loss of bowel or bladder denies loss of perirectal sensation. Patient denies lower extremity weakness. Patient has been able to ambulate though with pain. Past Medical History  Diagnosis Date  . Disc degeneration   . Hyperlipemia     takes Pravastatin daily  . Brain tumor (benign)   . Pinched nerve in shoulder   . Degenerative disc disease   . Hypertension     takes Lotrel daily  . Depression     takes Effexor  daily  . GERD (gastroesophageal reflux disease)     takes Omeprazole daily  . Anxiety     takes Xanax daily  . History of bronchitis     uses inhaler daily  . Pneumonia 2012  . Numbness in both hands   . Joint pain   . Chronic back pain     bulding disc  . Osteoporosis     Fosamax weekly  . Hepatitis     Hep C  . History of colon polyps   . History of staph infection   . Narcotic overdose 06/13/2015   Past Surgical History  Procedure Laterality Date  . Knee surgery Right   . Facial fracture surgery      in the late 90's  . Ankle fracture surgery Bilateral early 2000's  . Tubal ligation    . Colonoscopy    . Orif calcaneous fracture Right 07/13/2013    Procedure: RIGHT OPEN REDUCTION INTERNAL FIXATION (ORIF) CALCANEUS FRACTURE;  Surgeon: Newt Minion, MD;  Location: [facility];  Service: Orthopedics;  Laterality: Right;   Family History  Problem Relation Age of Onset  . Bipolar disorder Daughter   . Drug abuse Son   . Drug abuse Grandchild   . Drug abuse Grandchild    History  Substance Use Topics  . Smoking status: Former Smoker -- 0.50 packs/day for 40 years    Types: Cigarettes    Quit date: 06/01/2015  . Smokeless tobacco: Former Systems developer  .  Alcohol Use: No     Comment: denies   OB History    Gravida Para Term Preterm AB TAB SAB Ectopic Multiple Living   3 2 2  1  1   2      Review of Systems  Constitutional: Negative for fever and chills.  HENT: Negative for congestion and rhinorrhea.   Eyes: Negative for redness and visual disturbance.  Respiratory: Negative for shortness of breath and wheezing.   Cardiovascular: Negative for chest pain and palpitations.  Gastrointestinal: Negative for nausea, vomiting and bowel incontinence.  Genitourinary: Negative for bladder incontinence, dysuria and urgency.  Musculoskeletal: Positive for myalgias and back pain. Negative for arthralgias.  Skin: Negative for pallor and wound.  Neurological: Negative for dizziness, numbness  and headaches.      Allergies  Review of patient's allergies indicates no known allergies.  Home Medications   Prior to Admission medications   Medication Sig Start Date End Date Taking? Authorizing Provider  albuterol (PROVENTIL HFA;VENTOLIN HFA) 108 (90 BASE) MCG/ACT inhaler Inhale 2 puffs into the lungs every 6 (six) hours as needed for wheezing.   Yes Historical Provider, MD  alendronate (FOSAMAX) 70 MG tablet Take 70 mg by mouth every 7 (seven) days. On mondays. Take with a full glass of water on an empty stomach.    Yes Historical Provider, MD  alprazolam Duanne Moron) 2 MG tablet Take 1 tablet (2 mg total) by mouth 3 (three) times daily as needed for anxiety. 07/10/15  Yes Cloria Spring, MD  amLODipine-benazepril (LOTREL) 5-10 MG per capsule Take 1 capsule by mouth daily.   Yes Historical Provider, MD  aspirin-acetaminophen-caffeine (EXCEDRIN MIGRAINE) [phone] MG per tablet Take 1 tablet by mouth every 6 (six) hours as needed for pain.   Yes Historical Provider, MD  Calcium Carbonate-Vitamin D (CALCIUM + D PO) Take 1 tablet by mouth daily.    Yes Historical Provider, MD  gabapentin (NEURONTIN) 800 MG tablet Take 800 mg by mouth 4 (four) times daily.   Yes Historical Provider, MD  Nyoka Cowden Tea, Camillia sinensis, (GREEN TEA PO) Take 1 tablet by mouth daily.   Yes Historical Provider, MD  omeprazole (PRILOSEC) 20 MG capsule Take 20 mg by mouth daily.   Yes Historical Provider, MD  pravastatin (PRAVACHOL) 20 MG tablet Take 20 mg by mouth daily.   Yes Historical Provider, MD  tiotropium (SPIRIVA HANDIHALER) 18 MCG inhalation capsule Place 1 capsule (18 mcg total) into inhaler and inhale daily. Pharmacist please instruct in proper use. 05/29/15  Yes Samuella Cota, MD  venlafaxine XR (EFFEXOR-XR) 150 MG 24 hr capsule Take 2 capsules (300 mg total) by mouth daily. 07/10/15  Yes Cloria Spring, MD  predniSONE (DELTASONE) 20 MG tablet Take 1 tablet (20 mg total) by mouth 2 (two) times daily with a  meal. Patient not taking: Reported on 07/24/2015 06/23/15   Tanna Furry, MD   BP 107/61 mmHg  Pulse 94  Temp(Src) 97.8 F (36.6 C) (Oral)  Resp 18  SpO2 95% Physical Exam  Constitutional: She is oriented to person, place, and time. She appears well-developed and well-nourished. No distress.  HENT:  Head: Normocephalic and atraumatic.  Eyes: EOM are normal. Pupils are equal, round, and reactive to light.  Neck: Normal range of motion. Neck supple.  Cardiovascular: Normal rate and regular rhythm.  Exam reveals no gallop and no friction rub.   No murmur heard. Pulmonary/Chest: Effort normal. She has no wheezes. She has no rales.  Abdominal: Soft. She exhibits  no distension. There is no tenderness.  Musculoskeletal: She exhibits tenderness. She exhibits no edema.  Tender palpation about the T8 vertebra. Patient ambulates with a painful gait. Muscle strength intact and equal bilaterally. Reflexes 2+ bilaterally. Sensation intact pulses 2+ bilaterally.  Neurological: She is alert and oriented to person, place, and time.  Skin: Skin is warm and dry. She is not diaphoretic.  Psychiatric: She has a normal mood and affect. Her behavior is normal.    ED Course  Procedures (including critical care time) Labs Review Labs Reviewed - No data to display  Imaging Review Dg Thoracic Spine W/swimmers  07/24/2015   CLINICAL DATA:  Pain after falling backward from stepping stool 1 day prior  EXAM: THORACIC SPINE - 3 VIEWS  COMPARISON:  Chest radiograph June 23, 2015  FINDINGS: Frontal, lateral, and swimmer's views were obtained. There is marked anterior wedging of the T11 vertebral body with localized kyphosis at T10-11, stable. There is mild anterior wedging at T12, stable. There is also slightly greater wedging at L1 anteriorly, stable. There is milder anterior wedging of the T4 vertebral body, stable. No new fracture. No spondylolisthesis. There is disc space narrowing in the lower thoracic region, stable.   IMPRESSION: Anterior wedging of several vertebral bodies, most severe at T11, stable. Focal increase kyphosis at T10-11 is stable. Osteoarthritic change in the lower thoracic region is stable. No new fracture. No spondylolisthesis.   Electronically Signed   By: Lowella Grip III M.D.   On: 07/24/2015 13:42     EKG Interpretation None      MDM   Final diagnoses:  Fall  Left-sided thoracic back pain    64 yo F with a chief complaint of thoracic back pain. This happened as an acute incident and she fell yesterday. Will obtain plain films treat the patient's pain. No red flags. Able to ambulate without difficulty.  X-ray negative for acute fracture. Pain improved with Toradol and Valium.  3:31 PM:  I have discussed the diagnosis/risks/treatment options with the patient and family and believe the pt to be eligible for discharge home to follow-up with PCP. We also discussed returning to the ED immediately if new or worsening sx occur. We discussed the sx which are most concerning (e.g., cauda equina) that necessitate immediate return. Medications administered to the patient during their visit and any new prescriptions provided to the patient are listed below.  Medications given during this visit Medications  ketorolac (TORADOL) injection 30 mg (30 mg Intramuscular Given 07/24/15 1344)  diazepam (VALIUM) tablet 5 mg (5 mg Oral Given 07/24/15 1344)    Discharge Medication List as of 07/24/2015  1:48 PM       The patient appears reasonably screen and/or stabilized for discharge and I doubt any other medical condition or other [facility] requiring further screening, evaluation, or treatment in the ED at this time prior to discharge.    Deno Etienne, DO 07/24/15 1531

## 2015-07-29 ENCOUNTER — Emergency Department (HOSPITAL_COMMUNITY)
Admission: EM | Admit: 2015-07-29 | Discharge: 2015-07-29 | Disposition: A | Discharge status: Home / Self Care | Payer: Medicare Other | Attending: Emergency Medicine | Admitting: Emergency Medicine

## 2015-07-29 ENCOUNTER — Encounter (HOSPITAL_COMMUNITY): Payer: Self-pay | Admitting: *Deleted

## 2015-07-29 ENCOUNTER — Emergency Department (HOSPITAL_COMMUNITY): Payer: Medicare Other

## 2015-07-29 DIAGNOSIS — I10 Essential (primary) hypertension: Secondary | ICD-10-CM | POA: Insufficient documentation

## 2015-07-29 DIAGNOSIS — Z79899 Other long term (current) drug therapy: Secondary | ICD-10-CM | POA: Insufficient documentation

## 2015-07-29 DIAGNOSIS — Z87891 Personal history of nicotine dependence: Secondary | ICD-10-CM | POA: Insufficient documentation

## 2015-07-29 DIAGNOSIS — Z86011 Personal history of benign neoplasm of the brain: Secondary | ICD-10-CM | POA: Diagnosis not present

## 2015-07-29 DIAGNOSIS — D72829 Elevated white blood cell count, unspecified: Secondary | ICD-10-CM | POA: Insufficient documentation

## 2015-07-29 DIAGNOSIS — K219 Gastro-esophageal reflux disease without esophagitis: Secondary | ICD-10-CM | POA: Insufficient documentation

## 2015-07-29 DIAGNOSIS — M549 Dorsalgia, unspecified: Secondary | ICD-10-CM

## 2015-07-29 DIAGNOSIS — Z8619 Personal history of other infectious and parasitic diseases: Secondary | ICD-10-CM | POA: Diagnosis not present

## 2015-07-29 DIAGNOSIS — Z8701 Personal history of pneumonia (recurrent): Secondary | ICD-10-CM | POA: Diagnosis not present

## 2015-07-29 DIAGNOSIS — F329 Major depressive disorder, single episode, unspecified: Secondary | ICD-10-CM | POA: Diagnosis not present

## 2015-07-29 DIAGNOSIS — R110 Nausea: Secondary | ICD-10-CM | POA: Diagnosis present

## 2015-07-29 DIAGNOSIS — Z86010 Personal history of colonic polyps: Secondary | ICD-10-CM | POA: Insufficient documentation

## 2015-07-29 DIAGNOSIS — Z8709 Personal history of other diseases of the respiratory system: Secondary | ICD-10-CM | POA: Diagnosis not present

## 2015-07-29 DIAGNOSIS — E785 Hyperlipidemia, unspecified: Secondary | ICD-10-CM | POA: Diagnosis not present

## 2015-07-29 DIAGNOSIS — G8929 Other chronic pain: Secondary | ICD-10-CM | POA: Insufficient documentation

## 2015-07-29 DIAGNOSIS — F419 Anxiety disorder, unspecified: Secondary | ICD-10-CM | POA: Insufficient documentation

## 2015-07-29 DIAGNOSIS — R062 Wheezing: Secondary | ICD-10-CM | POA: Diagnosis not present

## 2015-07-29 LAB — URINALYSIS, ROUTINE W REFLEX MICROSCOPIC
BILIRUBIN URINE: NEGATIVE
Glucose, UA: NEGATIVE mg/dL
KETONES UR: NEGATIVE mg/dL
NITRITE: NEGATIVE
Specific Gravity, Urine: <1.005 — ABNORMAL LOW (ref 1.005–1.030)
UROBILINOGEN UA: 0.2 mg/dL (ref 0.0–1.0)
pH: 6 (ref 5.0–8.0)

## 2015-07-29 LAB — CBC WITH DIFFERENTIAL/PLATELET
Basophils Absolute: 0 10*3/uL (ref 0.0–0.1)
Basophils Relative: 0 % (ref 0–1)
Eosinophils Absolute: 0 10*3/uL (ref 0.0–0.7)
Eosinophils Relative: 0 % (ref 0–5)
HCT: 36.4 % (ref 36.0–46.0)
HEMOGLOBIN: 12.1 g/dL (ref 12.0–15.0)
LYMPHS PCT: 6 % — AB (ref 12–46)
Lymphs Abs: 1.4 10*3/uL (ref 0.7–4.0)
MCH: 31.5 pg (ref 26.0–34.0)
MCHC: 33.2 g/dL (ref 30.0–36.0)
MCV: 94.8 fL (ref 78.0–100.0)
MONOS PCT: 8 % (ref 3–12)
Monocytes Absolute: 1.8 10*3/uL — ABNORMAL HIGH (ref 0.1–1.0)
NEUTROS PCT: 86 % — AB (ref 43–77)
Neutro Abs: 21.2 10*3/uL — ABNORMAL HIGH (ref 1.7–7.7)
PLATELETS: 188 10*3/uL (ref 150–400)
RBC: 3.84 MIL/uL — ABNORMAL LOW (ref 3.87–5.11)
RDW: 14.6 % (ref 11.5–15.5)
WBC: 24.5 10*3/uL — AB (ref 4.0–10.5)

## 2015-07-29 LAB — COMPREHENSIVE METABOLIC PANEL
ALT: 20 U/L (ref 14–54)
AST: 26 U/L (ref 15–41)
Albumin: 3.4 g/dL — ABNORMAL LOW (ref 3.5–5.0)
Alkaline Phosphatase: 85 U/L (ref 38–126)
Anion gap: 10 (ref 5–15)
BUN: 12 mg/dL (ref 6–20)
CHLORIDE: 98 mmol/L — AB (ref 101–111)
CO2: 26 mmol/L (ref 22–32)
Calcium: 8.1 mg/dL — ABNORMAL LOW (ref 8.9–10.3)
Creatinine, Ser: 0.93 mg/dL (ref 0.44–1.00)
GFR calc Af Amer: >60 mL/min (ref >60)
Glucose, Bld: 99 mg/dL (ref 65–99)
Potassium: 3.1 mmol/L — ABNORMAL LOW (ref 3.5–5.1)
SODIUM: 134 mmol/L — AB (ref 135–145)
Total Bilirubin: 0.4 mg/dL (ref 0.3–1.2)
Total Protein: 6.7 g/dL (ref 6.5–8.1)

## 2015-07-29 LAB — URINE MICROSCOPIC-ADD ON

## 2015-07-29 MED ORDER — FENTANYL CITRATE (PF) 100 MCG/2ML IJ SOLN
50.0000 ug | Freq: Once | INTRAMUSCULAR | Status: AC
  Administered 2015-07-29: 50 ug via INTRAVENOUS
  Filled 2015-07-29 (×2): qty 2

## 2015-07-29 MED ORDER — FENTANYL CITRATE (PF) 100 MCG/2ML IJ SOLN
50.0000 ug | Freq: Once | INTRAMUSCULAR | Status: AC
  Administered 2015-07-29: 50 ug via INTRAVENOUS
  Filled 2015-07-29: qty 2

## 2015-07-29 MED ORDER — ACETAMINOPHEN 500 MG PO TABS
1000.0000 mg | ORAL_TABLET | Freq: Once | ORAL | Status: AC
  Administered 2015-07-29: 1000 mg via ORAL
  Filled 2015-07-29: qty 2

## 2015-07-29 MED ORDER — SODIUM CHLORIDE 0.9 % IV BOLUS (SEPSIS)
1000.0000 mL | Freq: Once | INTRAVENOUS | Status: AC
  Administered 2015-07-29: 1000 mL via INTRAVENOUS

## 2015-07-29 NOTE — ED Notes (Signed)
Pt requesting PO fluids at this time.  Informed to wait until after MD evaluation since she is here for nausea.

## 2015-07-29 NOTE — ED Notes (Signed)
Patient just went to the bathroom without telling Nurse or NT. Patient unable to void at this time. Will try again after IV fluid.

## 2015-07-29 NOTE — ED Notes (Signed)
Patient c/o nausea and poor appetite x 2 day and "just feeling bad". Some dizziness. Cough , productive at times.

## 2015-07-29 NOTE — Discharge Instructions (Signed)
Follow-up your pain management doctor tomorrow. Your test noted an elevated white blood count. I'm uncertain as to the cause for this. This will need to be rechecked at some time. Follow-up your primary care doctor or return if worse.

## 2015-07-29 NOTE — ED Notes (Signed)
Patient took Xanax from her bag. I told patient that we want her to keep from taking any medication while in ED unless we give it. Patient stated she was going to take it anyway. I witnessed the patient take 2 mg of Xanax PO.

## 2015-07-29 NOTE — ED Notes (Signed)
Patient given Cola per Dr. Jonathon Jordan instructions.
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
## 2015-08-01 LAB — URINE CULTURE

## 2015-08-02 NOTE — Progress Notes (Signed)
ED Antimicrobial Stewardship Positive Culture Follow Up   [name] is an 64 y.o. female who presented to [facility] on 07/29/2015 with a chief complaint of  Chief Complaint  Patient presents with  . Nausea    Recent Results (from the past 720 hour(s))  Urine culture     Status: None   Collection Time: 07/29/15 12:10 PM  Result Value Ref Range Status   Specimen Description URINE, CLEAN CATCH  Final   Special Requests IMMUNE:COMPROMISED  Final   Culture   Final    50,000 COLONIES/mL ESCHERICHIA COLI Performed at [facility]    Report Status 08/01/2015 FINAL  Final   Organism ID, Bacteria ESCHERICHIA COLI  Final      Susceptibility   Escherichia coli - MIC*    AMPICILLIN >=32 RESISTANT Resistant     CEFAZOLIN <=4 SENSITIVE Sensitive     CEFTRIAXONE <=1 SENSITIVE Sensitive     CIPROFLOXACIN <=0.25 SENSITIVE Sensitive     GENTAMICIN <=1 SENSITIVE Sensitive     IMIPENEM <=0.25 SENSITIVE Sensitive     NITROFURANTOIN <=16 SENSITIVE Sensitive     TRIMETH/SULFA <=20 SENSITIVE Sensitive     AMPICILLIN/SULBACTAM 16 INTERMEDIATE Intermediate     PIP/TAZO <=4 SENSITIVE Sensitive     * 50,000 COLONIES/mL ESCHERICHIA COLI    65 yo who presented with no urinary symptoms. UA neg. Will not need treatment   ED Provider: Eugene Gavia, PA  Onnie Boer, PharmD Pager: [phone] Infectious Diseases Pharmacist Phone# [phone]

## 2015-08-03 ENCOUNTER — Telehealth (HOSPITAL_COMMUNITY): Payer: Self-pay

## 2015-08-03 NOTE — Telephone Encounter (Signed)
Post ED Visit - Positive Culture Follow-up  Culture report reviewed by antimicrobial stewardship pharmacist: []  Wes Berwyn, Pharm.D., BCPS []  Heide Guile, Pharm.D., BCPS []  Alycia Rossetti, Pharm.D., BCPS [x]  [place], Pharm.D., BCPS, AAHIVP []  Legrand Como, Pharm.D., BCPS, AAHIVP []  Isac Sarna, Pharm.D., BCPS  Positive Urine culture, >/= 50,000 colonies -> E Coli Chart reviewed by H. Patel-Mills PA  "No treatment"  Dortha Kern 08/03/2015, 5:04 AM

## 2015-09-29 ENCOUNTER — Telehealth (HOSPITAL_COMMUNITY): Payer: Self-pay | Admitting: *Deleted

## 2015-09-29 NOTE — Telephone Encounter (Signed)
noted 

## 2015-09-29 NOTE — Telephone Encounter (Signed)
phone call from [place] from [place].  She said she is referring patient to [facility] for substance abuse treatment and she need information from Dr. Harrington Challenger. We are waiting for a release of information which has not been received.

## 2015-10-06 ENCOUNTER — Telehealth (HOSPITAL_COMMUNITY): Payer: Self-pay | Admitting: *Deleted

## 2015-10-06 NOTE — Telephone Encounter (Signed)
Patient would like something to wean her off Xanax.   She is out of Xanax.

## 2015-10-08 ENCOUNTER — Ambulatory Visit (INDEPENDENT_AMBULATORY_CARE_PROVIDER_SITE_OTHER): Payer: Medicare Other | Admitting: Psychiatry

## 2015-10-08 ENCOUNTER — Encounter (HOSPITAL_COMMUNITY): Payer: Self-pay | Admitting: Psychiatry

## 2015-10-08 VITALS — BP 127/73 | HR 76 | Ht 62.0 in | Wt 142.2 lb

## 2015-10-08 DIAGNOSIS — F411 Generalized anxiety disorder: Secondary | ICD-10-CM

## 2015-10-08 MED ORDER — VENLAFAXINE HCL ER 150 MG PO CP24: 300.0000 mg | ORAL_CAPSULE | Freq: Every day | ORAL | Status: AC

## 2015-10-08 MED ORDER — ALPRAZOLAM 1 MG PO TABS: 1.0000 mg | ORAL_TABLET | Freq: Every day | ORAL | Status: DC

## 2015-10-08 NOTE — Telephone Encounter (Signed)
lmtcb
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
## 2015-10-08 NOTE — Telephone Encounter (Signed)
Pt came to appt today 10-08-15

## 2015-10-10 ENCOUNTER — Ambulatory Visit (HOSPITAL_COMMUNITY): Payer: Self-pay | Admitting: Psychiatry

## 2015-10-13 ENCOUNTER — Telehealth (HOSPITAL_COMMUNITY): Payer: Self-pay | Admitting: *Deleted
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
## 2015-11-05 ENCOUNTER — Ambulatory Visit (HOSPITAL_COMMUNITY): Payer: Self-pay | Admitting: Psychiatry

## 2015-11-10 ENCOUNTER — Encounter (HOSPITAL_COMMUNITY): Payer: Self-pay | Admitting: Psychiatry

## 2015-11-25 ENCOUNTER — Encounter (HOSPITAL_COMMUNITY): Payer: Self-pay | Admitting: *Deleted

## 2015-11-25 ENCOUNTER — Emergency Department (HOSPITAL_COMMUNITY)
Admission: EM | Admit: 2015-11-25 | Discharge: 2015-11-25 | Disposition: A | Discharge status: Home / Self Care | Payer: Medicare Other | Attending: Emergency Medicine | Admitting: Emergency Medicine

## 2015-11-25 ENCOUNTER — Emergency Department (HOSPITAL_COMMUNITY): Payer: Medicare Other

## 2015-11-25 DIAGNOSIS — Z86018 Personal history of other benign neoplasm: Secondary | ICD-10-CM | POA: Diagnosis not present

## 2015-11-25 DIAGNOSIS — Z8701 Personal history of pneumonia (recurrent): Secondary | ICD-10-CM | POA: Diagnosis not present

## 2015-11-25 DIAGNOSIS — Z79899 Other long term (current) drug therapy: Secondary | ICD-10-CM | POA: Insufficient documentation

## 2015-11-25 DIAGNOSIS — E785 Hyperlipidemia, unspecified: Secondary | ICD-10-CM | POA: Diagnosis not present

## 2015-11-25 DIAGNOSIS — J069 Acute upper respiratory infection, unspecified: Secondary | ICD-10-CM | POA: Diagnosis not present

## 2015-11-25 DIAGNOSIS — J449 Chronic obstructive pulmonary disease, unspecified: Secondary | ICD-10-CM

## 2015-11-25 DIAGNOSIS — G8929 Other chronic pain: Secondary | ICD-10-CM | POA: Diagnosis not present

## 2015-11-25 DIAGNOSIS — I10 Essential (primary) hypertension: Secondary | ICD-10-CM | POA: Insufficient documentation

## 2015-11-25 DIAGNOSIS — J441 Chronic obstructive pulmonary disease with (acute) exacerbation: Secondary | ICD-10-CM | POA: Diagnosis not present

## 2015-11-25 DIAGNOSIS — M791 Myalgia: Secondary | ICD-10-CM | POA: Diagnosis not present

## 2015-11-25 DIAGNOSIS — F419 Anxiety disorder, unspecified: Secondary | ICD-10-CM | POA: Insufficient documentation

## 2015-11-25 DIAGNOSIS — M545 Low back pain: Secondary | ICD-10-CM | POA: Insufficient documentation

## 2015-11-25 DIAGNOSIS — Z8619 Personal history of other infectious and parasitic diseases: Secondary | ICD-10-CM | POA: Insufficient documentation

## 2015-11-25 DIAGNOSIS — Z87891 Personal history of nicotine dependence: Secondary | ICD-10-CM | POA: Insufficient documentation

## 2015-11-25 DIAGNOSIS — F329 Major depressive disorder, single episode, unspecified: Secondary | ICD-10-CM | POA: Diagnosis not present

## 2015-11-25 DIAGNOSIS — Z86010 Personal history of colonic polyps: Secondary | ICD-10-CM | POA: Diagnosis not present

## 2015-11-25 DIAGNOSIS — K219 Gastro-esophageal reflux disease without esophagitis: Secondary | ICD-10-CM | POA: Diagnosis not present

## 2015-11-25 DIAGNOSIS — R05 Cough: Secondary | ICD-10-CM | POA: Diagnosis present

## 2015-11-25 HISTORY — DX: Chronic obstructive pulmonary disease, unspecified: J44.9

## 2015-11-25 MED ORDER — DOXYCYCLINE HYCLATE 100 MG PO CAPS: 100.0000 mg | ORAL_CAPSULE | Freq: Two times a day (BID) | ORAL | Status: DC

## 2015-11-25 MED ORDER — IBUPROFEN 800 MG PO TABS
800.0000 mg | ORAL_TABLET | Freq: Once | ORAL | Status: AC
Start: 2015-11-25 — End: 2015-11-25
  Administered 2015-11-25: 800 mg via ORAL
  Filled 2015-11-25: qty 1

## 2015-11-25 MED ORDER — PREDNISONE 10 MG PO TABS: ORAL_TABLET | ORAL | Status: DC

## 2015-11-25 MED ORDER — DOXYCYCLINE HYCLATE 100 MG PO TABS
100.0000 mg | ORAL_TABLET | Freq: Once | ORAL | Status: AC
  Administered 2015-11-25: 100 mg via ORAL
  Filled 2015-11-25: qty 1

## 2015-11-25 MED ORDER — PREDNISONE 50 MG PO TABS
60.0000 mg | ORAL_TABLET | Freq: Once | ORAL | Status: AC
  Administered 2015-11-25: 60 mg via ORAL
  Filled 2015-11-25: qty 1

## 2015-11-25 MED ORDER — ONDANSETRON HCL 4 MG PO TABS
4.0000 mg | ORAL_TABLET | Freq: Once | ORAL | Status: AC
  Administered 2015-11-25: 4 mg via ORAL
  Filled 2015-11-25: qty 1

## 2015-11-25 NOTE — Discharge Instructions (Signed)
Please increase fluids. Wash hands frequently. Use prednisone daily with a meal, use doxycycline 2 times daily with food. Please continue your inhaler and current medications. Use Tylenol every 4 hours, or ibuprofen every 6 hours for fever or body aching. Please see Dr. Nevada Crane next week for recheck. Upper Respiratory Infection, Adult Most upper respiratory infections (URIs) are a viral infection of the air passages leading to the lungs. A URI affects the nose, throat, and upper air passages. The most common type of URI is nasopharyngitis and is typically referred to as "the common cold." URIs run their course and usually go away on their own. Most of the time, a URI does not require medical attention, but sometimes a bacterial infection in the upper airways can follow a viral infection. This is called a secondary infection. Sinus and middle ear infections are common types of secondary upper respiratory infections. Bacterial pneumonia can also complicate a URI. A URI can worsen asthma and chronic obstructive pulmonary disease (COPD). Sometimes, these complications can require emergency medical care and may be life threatening.  CAUSES Almost all URIs are caused by viruses. A virus is a type of germ and can spread from one person to another.  RISKS FACTORS You may be at risk for a URI if:   You smoke.   You have chronic heart or lung disease.  You have a weakened defense (immune) system.   You are very young or very old.   You have nasal allergies or asthma.  You work in crowded or poorly ventilated areas.  You work in health care facilities or schools. SIGNS AND SYMPTOMS  Symptoms typically develop 2-3 days after you come in contact with a cold virus. Most viral URIs last 7-10 days. However, viral URIs from the influenza virus (flu virus) can last 14-18 days and are typically more severe. Symptoms may include:   Runny or stuffy (congested) nose.   Sneezing.   Cough.   Sore throat.    Headache.   Fatigue.   Fever.   Loss of appetite.   Pain in your forehead, behind your eyes, and over your cheekbones (sinus pain).  Muscle aches.  DIAGNOSIS  Your health care provider may diagnose a URI by:  Physical exam.  Tests to check that your symptoms are not due to another condition such as:  Strep throat.  Sinusitis.  Pneumonia.  Asthma. TREATMENT  A URI goes away on its own with time. It cannot be cured with medicines, but medicines may be prescribed or recommended to relieve symptoms. Medicines may help:  Reduce your fever.  Reduce your cough.  Relieve nasal congestion. HOME CARE INSTRUCTIONS   Take medicines only as directed by your health care provider.   Gargle warm saltwater or take cough drops to comfort your throat as directed by your health care provider.  Use a warm mist humidifier or inhale steam from a shower to increase air moisture. This may make it easier to breathe.  Drink enough fluid to keep your urine clear or pale yellow.   Eat soups and other clear broths and maintain good nutrition.   Rest as needed.   Return to work when your temperature has returned to normal or as your health care provider advises. You may need to stay home longer to avoid infecting others. You can also use a face mask and careful hand washing to prevent spread of the virus.  Increase the usage of your inhaler if you have asthma.   Do not  use any tobacco products, including cigarettes, chewing tobacco, or electronic cigarettes. If you need help quitting, ask your health care provider. PREVENTION  The best way to protect yourself from getting a cold is to practice good hygiene.   Avoid oral or hand contact with people with cold symptoms.   Wash your hands often if contact occurs.  There is no clear evidence that vitamin C, vitamin E, echinacea, or exercise reduces the chance of developing a cold. However, it is always recommended to get plenty  of rest, exercise, and practice good nutrition.  SEEK MEDICAL CARE IF:   You are getting worse rather than better.   Your symptoms are not controlled by medicine.   You have chills.  You have worsening shortness of breath.  You have brown or red mucus.  You have yellow or brown nasal discharge.  You have pain in your face, especially when you bend forward.  You have a fever.  You have swollen neck glands.  You have pain while swallowing.  You have white areas in the back of your throat. SEEK IMMEDIATE MEDICAL CARE IF:   You have severe or persistent:  Headache.  Ear pain.  Sinus pain.  Chest pain.  You have chronic lung disease and any of the following:  Wheezing.  Prolonged cough.  Coughing up blood.  A change in your usual mucus.  You have a stiff neck.  You have changes in your:  Vision.  Hearing.  Thinking.  Mood. MAKE SURE YOU:   Understand these instructions.  Will watch your condition.  Will get help right away if you are not doing well or get worse.   This information is not intended to replace advice given to you by your health care provider. Make sure you discuss any questions you have with your health care provider.   Document Released: 06/01/2001 Document Revised: 04/22/2015 Document Reviewed: 03/13/2014 Elsevier Interactive Patient Education [employer].

## 2015-11-25 NOTE — ED Notes (Signed)
MD at bedside. 

## 2015-11-25 NOTE — ED Provider Notes (Signed)
CSN: [id number]     Arrival date & time 11/25/15  0930 History   First MD Initiated Contact with Patient 11/25/15 [phone]     Chief Complaint  Patient presents with  . Cough     (Consider location/radiation/quality/duration/timing/severity/associated sxs/prior Treatment) Patient is a 64 y.o. female presenting with cough. The history is provided by the patient.  Cough Cough characteristics:  Productive Sputum characteristics:  Yellow and green Severity:  Moderate Onset quality:  Gradual Duration:  1 day Timing:  Intermittent Progression:  Worsening Chronicity:  New Smoker: yes   Context: sick contacts and weather changes   Relieved by:  Nothing Worsened by:  Nothing tried Ineffective treatments:  Rest Associated symptoms: chills, myalgias, shortness of breath and sinus congestion   Associated symptoms: no fever, no rash and no sore throat   Risk factors: no recent travel     Past Medical History  Diagnosis Date  . Disc degeneration   . Hyperlipemia     takes Pravastatin daily  . Brain tumor (benign) ([place])   . Pinched nerve in shoulder   . Degenerative disc disease   . Hypertension     takes Lotrel daily  . Depression     takes Effexor daily  . GERD (gastroesophageal reflux disease)     takes Omeprazole daily  . Anxiety     takes Xanax daily  . History of bronchitis     uses inhaler daily  . Pneumonia 2012  . Numbness in both hands   . Joint pain   . Chronic back pain     bulding disc  . Osteoporosis     Fosamax weekly  . Hepatitis     Hep C  . History of colon polyps   . History of staph infection   . Narcotic overdose 06/13/2015  . COPD (chronic obstructive pulmonary disease) [facility])    Past Surgical History  Procedure Laterality Date  . Knee surgery Right   . Facial fracture surgery      in the late 90's  . Ankle fracture surgery Bilateral early 2000's  . Tubal ligation    . Colonoscopy    . Orif calcaneous fracture Right 07/13/2013    Procedure: RIGHT  OPEN REDUCTION INTERNAL FIXATION (ORIF) CALCANEUS FRACTURE;  Surgeon: Newt Minion, MD;  Location: [place];  Service: Orthopedics;  Laterality: Right;   Family History  Problem Relation Age of Onset  . Bipolar disorder Daughter   . Drug abuse Son   . Drug abuse Grandchild   . Drug abuse Grandchild    Social History  Substance Use Topics  . Smoking status: Former Smoker -- 0.50 packs/day for 40 years    Types: Cigarettes    Quit date: 06/01/2015  . Smokeless tobacco: Former Systems developer  . Alcohol Use: No     Comment: denies   OB History    Gravida Para Term Preterm AB TAB SAB Ectopic Multiple Living   3 2 2  1  1   2      Review of Systems  Constitutional: Positive for chills. Negative for fever.  HENT: Negative for sore throat.   Respiratory: Positive for cough and shortness of breath.   Musculoskeletal: Positive for myalgias, back pain and arthralgias.  Skin: Negative for rash.  All other systems reviewed and are negative.     Allergies  Review of patient's allergies indicates no known allergies.  Home Medications   Prior to Admission medications   Medication Sig Start Date End Date  Taking? Authorizing Provider  albuterol (PROVENTIL HFA;VENTOLIN HFA) 108 (90 BASE) MCG/ACT inhaler Inhale 2 puffs into the lungs every 6 (six) hours as needed for wheezing.   Yes Historical Provider, MD  alendronate (FOSAMAX) 70 MG tablet Take 70 mg by mouth every 7 (seven) days. On mondays. Take with a full glass of water on an empty stomach.    Yes Historical Provider, MD  amLODipine-benazepril (LOTREL) 5-10 MG per capsule Take 1 capsule by mouth daily.   Yes Historical Provider, MD  Aspirin-Acetaminophen-Caffeine (EXCEDRIN PO) Take 2 tablets by mouth 3 (three) times daily as needed (back and leg pain).   Yes Historical Provider, MD  Calcium Carbonate-Vitamin D (CALCIUM + D PO) Take 1 tablet by mouth daily.    Yes Historical Provider, MD  gabapentin (NEURONTIN) 800 MG tablet Take 800 mg by mouth 4  (four) times daily.   Yes Historical Provider, MD  Nyoka Cowden Tea, Camillia sinensis, (GREEN TEA PO) Take 1 tablet by mouth daily.   Yes Historical Provider, MD  omeprazole (PRILOSEC) 20 MG capsule Take 20 mg by mouth daily.   Yes Historical Provider, MD  pravastatin (PRAVACHOL) 20 MG tablet Take 20 mg by mouth daily.   Yes Historical Provider, MD  tiotropium (SPIRIVA HANDIHALER) 18 MCG inhalation capsule Place 1 capsule (18 mcg total) into inhaler and inhale daily. Pharmacist please instruct in proper use. 05/29/15  Yes Samuella Cota, MD  venlafaxine XR (EFFEXOR-XR) 150 MG 24 hr capsule Take 2 capsules (300 mg total) by mouth daily. 10/08/15  Yes Cloria Spring, MD  ALPRAZolam Duanne Moron) 1 MG tablet Take 1 tablet (1 mg total) by mouth 5 (five) times daily. Patient not taking: Reported on 11/25/2015 10/08/15 10/07/16  Cloria Spring, MD  alprazolam Duanne Moron) 2 MG tablet Take 1 tablet (2 mg total) by mouth 3 (three) times daily as needed for anxiety. Patient not taking: Reported on 11/25/2015 07/10/15   Cloria Spring, MD   BP 135/84 mmHg  Pulse 80  Temp(Src) 97.9 F (36.6 C) (Oral)  Resp 18  Ht 5\' 2"  (1.575 m)  Wt 63.504 kg  BMI 25.60 kg/m2  SpO2 95% Physical Exam  Constitutional: She is oriented to person, place, and time. She appears well-developed and well-nourished.  Non-toxic appearance.  HENT:  Head: Normocephalic.  Right Ear: Tympanic membrane and external ear normal.  Left Ear: Tympanic membrane and external ear normal.  Eyes: EOM and lids are normal. Pupils are equal, round, and reactive to light.  Neck: Normal range of motion. Neck supple. Carotid bruit is not present.  Cardiovascular: Normal rate, regular rhythm, normal heart sounds, intact distal pulses and normal pulses.   Pulmonary/Chest: Breath sounds normal. No respiratory distress. She has no wheezes. She has no rales.  Pt speaks in complete sentences. Course breath sounds present. Scattered rhonchi.  Abdominal: Soft. Bowel  sounds are normal. There is no tenderness. There is no guarding.  Musculoskeletal: Normal range of motion.  Lymphadenopathy:       Head (right side): No submandibular adenopathy present.       Head (left side): No submandibular adenopathy present.    She has no cervical adenopathy.  Neurological: She is alert and oriented to person, place, and time. She has normal strength. No cranial nerve deficit or sensory deficit.  Skin: Skin is warm and dry.  Psychiatric: She has a normal mood and affect. Her speech is normal.  Nursing note and vitals reviewed.   ED Course  Procedures (including critical care  time) Labs Review Labs Reviewed - No data to display  Imaging Review No results found. I have personally reviewed and evaluated these images and lab results as part of my medical decision-making.   EKG Interpretation None      MDM  Vital signs reviewed. Chest x-ray is negative for acute cardiopulmonary disease. There were some changes of chronic pulmonary disease.   The examination favors upper respiratory infection with some exacerbation of chronic obstructive pulmonary disease.  The patient is treated with doxycycline 2 times daily, prednisone taper, and patient is to continue her current COPD medications. She will follow up with primary care physician, or return to the emergency department if any changes or problems.    Final diagnoses:  URI (upper respiratory infection)  Chronic obstructive pulmonary disease, unspecified COPD type ([place])    **I have reviewed nursing notes, vital signs, and all appropriate lab and imaging results for this patient.Lily Kocher, PA-C 11/26/15 2104  Forde Dandy, MD 11/28/15 [phone]

## 2015-11-25 NOTE — ED Notes (Signed)
Pt states she has had a prodcutive cough since yesterday with yellow sputum. Pt denies any emesis, states she just feels tired. nad noted.

## 2015-11-26 ENCOUNTER — Encounter (INDEPENDENT_AMBULATORY_CARE_PROVIDER_SITE_OTHER): Payer: Self-pay | Admitting: *Deleted

## 2015-12-14 ENCOUNTER — Encounter (HOSPITAL_COMMUNITY): Payer: Self-pay | Admitting: Emergency Medicine

## 2015-12-14 ENCOUNTER — Emergency Department (HOSPITAL_COMMUNITY): Payer: Medicare Other

## 2015-12-14 ENCOUNTER — Emergency Department (HOSPITAL_COMMUNITY)
Admission: EM | Admit: 2015-12-14 | Discharge: 2015-12-14 | Disposition: A | Discharge status: Home / Self Care | Payer: Medicare Other | Attending: Emergency Medicine | Admitting: Emergency Medicine

## 2015-12-14 DIAGNOSIS — F329 Major depressive disorder, single episode, unspecified: Secondary | ICD-10-CM | POA: Insufficient documentation

## 2015-12-14 DIAGNOSIS — I10 Essential (primary) hypertension: Secondary | ICD-10-CM | POA: Insufficient documentation

## 2015-12-14 DIAGNOSIS — E785 Hyperlipidemia, unspecified: Secondary | ICD-10-CM | POA: Insufficient documentation

## 2015-12-14 DIAGNOSIS — Z86011 Personal history of benign neoplasm of the brain: Secondary | ICD-10-CM | POA: Insufficient documentation

## 2015-12-14 DIAGNOSIS — Z79899 Other long term (current) drug therapy: Secondary | ICD-10-CM | POA: Insufficient documentation

## 2015-12-14 DIAGNOSIS — G8929 Other chronic pain: Secondary | ICD-10-CM | POA: Diagnosis not present

## 2015-12-14 DIAGNOSIS — Z8701 Personal history of pneumonia (recurrent): Secondary | ICD-10-CM | POA: Diagnosis not present

## 2015-12-14 DIAGNOSIS — F1910 Other psychoactive substance abuse, uncomplicated: Secondary | ICD-10-CM | POA: Diagnosis not present

## 2015-12-14 DIAGNOSIS — K219 Gastro-esophageal reflux disease without esophagitis: Secondary | ICD-10-CM | POA: Insufficient documentation

## 2015-12-14 DIAGNOSIS — Z87891 Personal history of nicotine dependence: Secondary | ICD-10-CM | POA: Diagnosis not present

## 2015-12-14 DIAGNOSIS — R251 Tremor, unspecified: Secondary | ICD-10-CM

## 2015-12-14 DIAGNOSIS — F419 Anxiety disorder, unspecified: Secondary | ICD-10-CM | POA: Diagnosis not present

## 2015-12-14 DIAGNOSIS — M810 Age-related osteoporosis without current pathological fracture: Secondary | ICD-10-CM | POA: Diagnosis not present

## 2015-12-14 DIAGNOSIS — J449 Chronic obstructive pulmonary disease, unspecified: Secondary | ICD-10-CM | POA: Insufficient documentation

## 2015-12-14 DIAGNOSIS — Z8619 Personal history of other infectious and parasitic diseases: Secondary | ICD-10-CM | POA: Insufficient documentation

## 2015-12-14 DIAGNOSIS — Z86010 Personal history of colonic polyps: Secondary | ICD-10-CM | POA: Diagnosis not present

## 2015-12-14 HISTORY — DX: Other chronic pain: G89.29

## 2015-12-14 HISTORY — DX: Headache: R51

## 2015-12-14 HISTORY — DX: Other psychoactive substance abuse, uncomplicated: F19.10

## 2015-12-14 HISTORY — DX: Headache, unspecified: R51.9

## 2015-12-14 LAB — CBC WITH DIFFERENTIAL/PLATELET
Basophils Absolute: 0.1 10*3/uL (ref 0.0–0.1)
Basophils Relative: 1 %
EOS PCT: 2 %
Eosinophils Absolute: 0.2 10*3/uL (ref 0.0–0.7)
HCT: 38.8 % (ref 36.0–46.0)
Hemoglobin: 12.8 g/dL (ref 12.0–15.0)
Lymphocytes Relative: 24 %
Lymphs Abs: 2.3 10*3/uL (ref 0.7–4.0)
MCH: 31 pg (ref 26.0–34.0)
MCHC: 33 g/dL (ref 30.0–36.0)
MCV: 93.9 fL (ref 78.0–100.0)
MONO ABS: 0.9 10*3/uL (ref 0.1–1.0)
MONOS PCT: 9 %
NEUTROS ABS: 6.3 10*3/uL (ref 1.7–7.7)
Neutrophils Relative %: 64 %
PLATELETS: 185 10*3/uL (ref 150–400)
RBC: 4.13 MIL/uL (ref 3.87–5.11)
RDW: 14.3 % (ref 11.5–15.5)
WBC: 9.7 10*3/uL (ref 4.0–10.5)

## 2015-12-14 LAB — COMPREHENSIVE METABOLIC PANEL
ALBUMIN: 4.5 g/dL (ref 3.5–5.0)
ALT: 13 U/L — ABNORMAL LOW (ref 14–54)
ANION GAP: 10 (ref 5–15)
AST: 21 U/L (ref 15–41)
Alkaline Phosphatase: 32 U/L — ABNORMAL LOW (ref 38–126)
BUN: 20 mg/dL (ref 6–20)
CO2: 26 mmol/L (ref 22–32)
Calcium: 9.3 mg/dL (ref 8.9–10.3)
Chloride: 106 mmol/L (ref 101–111)
Creatinine, Ser: 1.68 mg/dL — ABNORMAL HIGH (ref 0.44–1.00)
GFR, EST AFRICAN AMERICAN: 36 mL/min — AB (ref >60)
GFR, EST NON AFRICAN AMERICAN: 31 mL/min — AB (ref >60)
GLUCOSE: 84 mg/dL (ref 65–99)
POTASSIUM: 4.2 mmol/L (ref 3.5–5.1)
Sodium: 142 mmol/L (ref 135–145)
Total Bilirubin: 0.4 mg/dL (ref 0.3–1.2)
Total Protein: 7.2 g/dL (ref 6.5–8.1)

## 2015-12-14 LAB — URINALYSIS, ROUTINE W REFLEX MICROSCOPIC
Bilirubin Urine: NEGATIVE
GLUCOSE, UA: NEGATIVE mg/dL
Hgb urine dipstick: NEGATIVE
KETONES UR: NEGATIVE mg/dL
Leukocytes, UA: NEGATIVE
Nitrite: NEGATIVE
PROTEIN: NEGATIVE mg/dL
SPECIFIC GRAVITY, URINE: 1.015 (ref 1.005–1.030)
pH: 5.5 (ref 5.0–8.0)

## 2015-12-14 LAB — TROPONIN I: Troponin I: <0.03 ng/mL (ref <0.031)

## 2015-12-14 LAB — RAPID URINE DRUG SCREEN, HOSP PERFORMED
Amphetamines: NOT DETECTED
BENZODIAZEPINES: POSITIVE — AB
Barbiturates: NOT DETECTED
Cocaine: POSITIVE — AB
OPIATES: POSITIVE — AB
TETRAHYDROCANNABINOL: NOT DETECTED

## 2015-12-14 LAB — CK: Total CK: 185 U/L (ref 38–234)

## 2015-12-14 MED ORDER — SODIUM CHLORIDE 0.9 % IV BOLUS (SEPSIS)
1000.0000 mL | Freq: Once | INTRAVENOUS | Status: AC
  Administered 2015-12-14: 1000 mL via INTRAVENOUS

## 2015-12-14 NOTE — ED Notes (Signed)
Pt reports went through detox for xanax about three weeks ago. Pt reports ever since has not felt right. Pt reports "generalized shaking" intermittent sob ever since. Pt reports last episode of "shaking" last night. Pt reports also has a brain tumor that has not been checked in "10 years." . Nad noted. Pt denies any cp,sob at this time.

## 2015-12-14 NOTE — ED Provider Notes (Signed)
CSN: [id number]     Arrival date & time 12/14/15  1259 History   First MD Initiated Contact with Patient 12/14/15 1314     Chief Complaint  Patient presents with  . Tremors  . Anxiety      HPI  Pt was seen at 1325. Per pt, c/o gradual onset and persistence of multiple intermittent episodes of "twitching" for the past 3 weeks. Pt states the "twitching" began "after I went through detox and got myself off xanax." Pt states her LD xanax was 3 weeks ago. States she "feels shaky," anxious, and "nervous." Denies SI/SA, no HI, no hallucinations. Denies LOC, no AMS, no falls, no headache, no change in her chronic neck/back pain, no CP/palpitations, no SOB/cough, no abd pain, no N/V/D, no focal motor weakness, no tingling/numbness in extremities, no facial droop, no slurred speech.    Past Medical History  Diagnosis Date  . Disc degeneration   . Hyperlipemia     takes Pravastatin daily  . Brain tumor (benign) ([place])   . Pinched nerve in shoulder   . Degenerative disc disease   . Hypertension     takes Lotrel daily  . Depression     takes Effexor daily  . GERD (gastroesophageal reflux disease)     takes Omeprazole daily  . Anxiety     takes Xanax daily  . History of bronchitis     uses inhaler daily  . Pneumonia 2012  . Numbness in both hands   . Joint pain   . Chronic back pain     bulding disc  . Osteoporosis     Fosamax weekly  . Hepatitis     Hep C  . History of colon polyps   . History of staph infection   . Narcotic overdose 06/13/2015  . COPD (chronic obstructive pulmonary disease) ([place])   . Headache   . Chronic pain   . Substance abuse     benzos, opiates "from non-prescribed sources"   Past Surgical History  Procedure Laterality Date  . Knee surgery Right   . Facial fracture surgery      in the late 90's  . Ankle fracture surgery Bilateral early 2000's  . Tubal ligation    . Colonoscopy    . Orif calcaneous fracture Right 07/13/2013    Procedure: RIGHT OPEN  REDUCTION INTERNAL FIXATION (ORIF) CALCANEUS FRACTURE;  Surgeon: Newt Minion, MD;  Location: [place];  Service: Orthopedics;  Laterality: Right;   Family History  Problem Relation Age of Onset  . Bipolar disorder Daughter   . Drug abuse Son   . Drug abuse Grandchild   . Drug abuse Grandchild    Social History  Substance Use Topics  . Smoking status: Former Smoker -- 0.50 packs/day for 40 years    Types: Cigarettes    Quit date: 06/01/2015  . Smokeless tobacco: Former Systems developer  . Alcohol Use: No     Comment: denies   OB History    Gravida Para Term Preterm AB TAB SAB Ectopic Multiple Living   3 2 2  1  1   2      Review of Systems ROS: Statement: All systems negative except as marked or noted in the HPI; Constitutional: Negative for fever and chills. ; ; Eyes: Negative for eye pain, redness and discharge. ; ; ENMT: Negative for ear pain, hoarseness, nasal congestion, sinus pressure and sore throat. ; ; Cardiovascular: Negative for chest pain, palpitations, diaphoresis, dyspnea and peripheral edema. ; ;  Respiratory: Negative for cough, wheezing and stridor. ; ; Gastrointestinal: Negative for nausea, vomiting, diarrhea, abdominal pain, blood in stool, hematemesis, jaundice and rectal bleeding. . ; ; Genitourinary: Negative for dysuria, flank pain and hematuria. ; ; Musculoskeletal: Negative for back pain and neck pain. Negative for swelling and trauma.; ; Skin: Negative for pruritus, rash, abrasions, blisters, bruising and skin lesion.; ; Neuro: +"shaking." Negative for headache, lightheadedness and neck stiffness. Negative for weakness, altered level of consciousness , altered mental status, extremity weakness, paresthesias, and syncope.     Allergies  Review of patient's allergies indicates no known allergies.  Home Medications   Prior to Admission medications   Medication Sig Start Date End Date Taking? Authorizing Provider  albuterol (PROVENTIL HFA;VENTOLIN HFA) 108 (90 BASE) MCG/ACT  inhaler Inhale 2 puffs into the lungs every 6 (six) hours as needed for wheezing.   Yes Historical Provider, MD  alendronate (FOSAMAX) 70 MG tablet Take 70 mg by mouth every 7 (seven) days. On mondays. Take with a full glass of water on an empty stomach.    Yes Historical Provider, MD  ALPRAZolam Duanne Moron) 1 MG tablet Take 1 tablet (1 mg total) by mouth 5 (five) times daily. 10/08/15 10/07/16 Yes Cloria Spring, MD  alprazolam Duanne Moron) 2 MG tablet Take 1 tablet (2 mg total) by mouth 3 (three) times daily as needed for anxiety. 07/10/15  Yes Cloria Spring, MD  amLODipine-benazepril (LOTREL) 5-10 MG per capsule Take 1 capsule by mouth daily.   Yes Historical Provider, MD  Aspirin-Acetaminophen-Caffeine (EXCEDRIN PO) Take 2 tablets by mouth 3 (three) times daily as needed (back and leg pain).   Yes Historical Provider, MD  Calcium Carbonate-Vitamin D (CALCIUM + D PO) Take 1 tablet by mouth daily.    Yes Historical Provider, MD  gabapentin (NEURONTIN) 800 MG tablet Take 800 mg by mouth 4 (four) times daily.   Yes Historical Provider, MD  Nyoka Cowden Tea, Camillia sinensis, (GREEN TEA PO) Take 1 tablet by mouth daily.   Yes Historical Provider, MD  omeprazole (PRILOSEC) 20 MG capsule Take 20 mg by mouth daily.   Yes Historical Provider, MD  pravastatin (PRAVACHOL) 20 MG tablet Take 20 mg by mouth daily.   Yes Historical Provider, MD  tiotropium (SPIRIVA HANDIHALER) 18 MCG inhalation capsule Place 1 capsule (18 mcg total) into inhaler and inhale daily. Pharmacist please instruct in proper use. 05/29/15  Yes Samuella Cota, MD  venlafaxine XR (EFFEXOR-XR) 150 MG 24 hr capsule Take 2 capsules (300 mg total) by mouth daily. 10/08/15  Yes Cloria Spring, MD  doxycycline (VIBRAMYCIN) 100 MG capsule Take 1 capsule (100 mg total) by mouth 2 (two) times daily. Patient not taking: Reported on 12/14/2015 11/25/15   Lily Kocher, PA-C  predniSONE (DELTASONE) 10 MG tablet 5,4,3,2,1 - take with food Patient not taking:  Reported on 12/14/2015 11/25/15   Lily Kocher, PA-C   BP 116/87 mmHg  Pulse 91  Temp(Src) 98.2 F (36.8 C) (Oral)  Resp 17  Ht 5\' 2"  (1.575 m)  Wt 140 lb (63.504 kg)  BMI 25.60 kg/m2  SpO2 95% Physical Exam  1330: Physical examination:  Nursing notes reviewed; Vital signs and O2 SAT reviewed;  Constitutional: Well developed, Well nourished, Well hydrated, Non-toxic appearing.; Head:  Normocephalic, atraumatic; Eyes: EOMI, PERRL, No scleral icterus; ENMT: Mouth and pharynx normal, Mucous membranes moist; Neck: Supple, Full range of motion, No lymphadenopathy; Cardiovascular: Regular rate and rhythm, No gallop; Respiratory: Breath sounds clear & equal bilaterally, No  wheezes.  Speaking full sentences with ease, Normal respiratory effort/excursion; Chest: Nontender, Movement normal; Abdomen: Soft, Nontender, Nondistended, Normal bowel sounds; Genitourinary: No CVA tenderness; Extremities: Pulses normal, No tenderness, No edema, No calf edema or asymmetry.; Neuro: AA&Ox3, Major CN grossly intact. No facial droop. Speech clear. No gross focal motor or sensory deficits in extremities. No tremor. No involuntary movements.; Skin: Color normal, Warm, Dry.; Psych:  Anxious, wringing her hands during my evaluation.      ED Course  Procedures (including critical care time)  Labs Review  Imaging Review  I have personally reviewed and evaluated these images and lab results as part of my medical decision-making.   EKG Interpretation   Date/Time:  Sunday December 14 2015 13:08:24 EST Ventricular Rate:  91 PR Interval:  134 QRS Duration: 118 QT Interval:  359 QTC Calculation: 442 R Axis:   29 Text Interpretation:  Sinus rhythm Left atrial enlargement Nonspecific  intraventricular conduction delay Artifact When compared with ECG of  06/15/2015 Artifact is now Present Confirmed by [facility]  MD, Nunzio Cory  [phone]) on 12/14/2015 2:40:26 PM      MDM  MDM Reviewed: previous chart, nursing note  and vitals Reviewed previous: labs and ECG Interpretation: labs and ECG     Results for orders placed or performed during the hospital encounter of 12/14/15  Troponin I  Result Value Ref Range   Troponin I <0.03 <0.031 ng/mL  Urine rapid drug screen (hosp performed)  Result Value Ref Range   Opiates POSITIVE (A) NONE DETECTED   Cocaine POSITIVE (A) NONE DETECTED   Benzodiazepines POSITIVE (A) NONE DETECTED   Amphetamines NONE DETECTED NONE DETECTED   Tetrahydrocannabinol NONE DETECTED NONE DETECTED   Barbiturates NONE DETECTED NONE DETECTED  Comprehensive metabolic panel  Result Value Ref Range   Sodium 142 135 - 145 mmol/L   Potassium 4.2 3.5 - 5.1 mmol/L   Chloride 106 101 - 111 mmol/L   CO2 26 22 - 32 mmol/L   Glucose, Bld 84 65 - 99 mg/dL   BUN 20 6 - 20 mg/dL   Creatinine, Ser 1.68 (H) 0.44 - 1.00 mg/dL   Calcium 9.3 8.9 - 10.3 mg/dL   Total Protein 7.2 6.5 - 8.1 g/dL   Albumin 4.5 3.5 - 5.0 g/dL   AST 21 15 - 41 U/L   ALT 13 (L) 14 - 54 U/L   Alkaline Phosphatase 32 (L) 38 - 126 U/L   Total Bilirubin 0.4 0.3 - 1.2 mg/dL   GFR calc non Af Amer 31 (L) >60 mL/min   GFR calc Af Amer 36 (L) >60 mL/min   Anion gap 10 5 - 15  CBC with Differential  Result Value Ref Range   WBC 9.7 4.0 - 10.5 K/uL   RBC 4.13 3.87 - 5.11 MIL/uL   Hemoglobin 12.8 12.0 - 15.0 g/dL   HCT 38.8 36.0 - 46.0 %   MCV 93.9 78.0 - 100.0 fL   MCH 31.0 26.0 - 34.0 pg   MCHC 33.0 30.0 - 36.0 g/dL   RDW 14.3 11.5 - 15.5 %   Platelets 185 150 - 400 K/uL   Neutrophils Relative % 64 %   Neutro Abs 6.3 1.7 - 7.7 K/uL   Lymphocytes Relative 24 %   Lymphs Abs 2.3 0.7 - 4.0 K/uL   Monocytes Relative 9 %   Monocytes Absolute 0.9 0.1 - 1.0 K/uL   Eosinophils Relative 2 %   Eosinophils Absolute 0.2 0.0 - 0.7 K/uL   Basophils  Relative 1 %   Basophils Absolute 0.1 0.0 - 0.1 K/uL  CK  Result Value Ref Range   Total CK 185 38 - 234 U/L  Urinalysis, Routine w reflex microscopic  Result Value Ref Range    Color, Urine YELLOW YELLOW   APPearance CLEAR CLEAR   Specific Gravity, Urine 1.015 1.005 - 1.030   pH 5.5 5.0 - 8.0   Glucose, UA NEGATIVE NEGATIVE mg/dL   Hgb urine dipstick NEGATIVE NEGATIVE   Bilirubin Urine NEGATIVE NEGATIVE   Ketones, ur NEGATIVE NEGATIVE mg/dL   Protein, ur NEGATIVE NEGATIVE mg/dL   Nitrite NEGATIVE NEGATIVE   Leukocytes, UA NEGATIVE NEGATIVE    Ct Head Wo Contrast 12/14/2015  CLINICAL DATA:  Shaking since last night. History of benign brain tumor diagnosed 10 years ago. Generalized anxiety disorder. EXAM: CT HEAD WITHOUT CONTRAST TECHNIQUE: Contiguous axial images were obtained from the base of the skull through the vertex without intravenous contrast. COMPARISON:  Head CT 03/21/2008. FINDINGS: There is no evidence of acute intracranial hemorrhage, mass lesion, brain edema or extra-axial fluid collection. The ventricles and subarachnoid spaces are appropriately sized for age. There is no CT evidence of acute cortical infarction. Oval dural-based calcification anteriorly in the right middle cranial fossa is unchanged (images 7 and 8). There is no surrounding abnormal brain edema. Intracranial vascular calcifications are noted. The visualized paranasal sinuses, mastoid air cells and middle ears are clear. The calvarium is intact. IMPRESSION: Stable head CT. No acute intracranial findings. Stable extra-axial calcification anteriorly in the right middle cranial fossa from 2009. Electronically Signed   By: Richardean Sale M.D.   On: 12/14/2015 16:10     1335:  Pt keeps repeating to me that she has been off her xanax "for at least 3 weeks" and "since I've been out of detox." [place] Controlled Substance Database accessed: pt filled 2 xanax prescriptions, totaling #150, on 11/06/2015. I queried pt regarding this. First, she denied she had even filled the prescriptions; then, she said she "took them all;" then, she said she was "only taking them every once in a while." Given her  PMHx, I am concerned regarding this information and pt's differing accounts of her pills. Workup ordered.  1545:  Cr elevated from baseline; IVF ordered. Pt queried regarding her UDS results. Pt immediately stated she "went to detox" and "haven't done any drugs." I again informed her of her +UDS for multiple substances not prescribed to her as well as illegal substance. Pt again stated she "went to detox" followed by "so what are you going to prescribe for me?" I informed her that she would not be receiving any prescriptions today, nor receiving narcotics or benzos while in the ED, given her +UDS. I encouraged pt to speak with her Psych MD regarding her anxiety and any further benzos rx. Pt asked again "well, can't you prescribe something?" I reiterated above. Pt stated "yeah, ok."     [id number]:  IVF completed. Pt has tol PO well while in the ED without N/V. No involuntary movements, tremors, etc, while in the ED. Workup reassuring. No clear indication for admission at this time. States she is ready to go home now. Dx and testing d/w pt and family.  Questions answered.  Verb understanding, agreeable to d/c home with outpt f/u.   Francine Graven, DO 12/18/15 2010

## 2015-12-14 NOTE — Discharge Instructions (Signed)
°Emergency Department Resource Guide °1) Find a Doctor and Pay Out of Pocket °Although you won't have to find out who is covered by your insurance plan, it is a good idea to ask around and get recommendations. You will then need to call the office and see if the doctor you have chosen will accept you as a new patient and what types of options they offer for patients who are self-pay. Some doctors offer discounts or will set up payment plans for their patients who do not have insurance, but you will need to ask so you aren't surprised when you get to your appointment. ° °2) Contact Your Local Health Department °Not all health departments have doctors that can see patients for sick visits, but many do, so it is worth a call to see if yours does. If you don't know where your local health department is, you can check in your phone book. The CDC also has a tool to help you locate your state's health department, and many state websites also have listings of all of their local health departments. ° °3) Find a Walk-in Clinic °If your illness is not likely to be very severe or complicated, you may want to try a walk in clinic. These are popping up all over the country in pharmacies, drugstores, and shopping centers. They're usually staffed by nurse practitioners or physician assistants that have been trained to treat common illnesses and complaints. They're usually fairly quick and inexpensive. However, if you have serious medical issues or chronic medical problems, these are probably not your best option. ° °No Primary Care Doctor: °- Call [facility] at  832-8000 - they can help you locate a primary care doctor that  accepts your insurance, provides certain services, etc. °- Physician Referral Service- 1-800-533-3463 ° °Chronic Pain Problems: °Organization         Address  Phone   Notes  °[facility]  (336) [phone] Patients need to be referred by their primary care doctor.  ° °Medication  Assistance: °Organization         Address  Phone   Notes  °[place] [facility] [address] °[place], [place] [address] (336) 641-8030 --Must be a resident of [place] °-- Must have NO insurance coverage whatsoever (no Medicaid/ Medicare, etc.) °-- The pt. MUST have a primary care doctor that directs their care regularly and follows them in the community °  °[facility]  (866) 331-1348   °[employer]  (888) 892-1162   ° °Agencies that provide inexpensive medical care: °Organization         Address  Phone   Notes  °[facility]  (336) 832-8035   °[facility]    (336) 832-7272   °[facility] [address] °[place], [place] [address] (336) 832-4777   °[facility] [address], °[place] (336) 271-4999   °[facility]    (336) 373-0678   °[facility]    (336) 272-1050   °[facility] ° [address], [place] Phone:  (336) 832-4444, Fax:  (336) 832-4440 Hours of Operation:  9 am - 6 pm, M-F.  Also accepts Medicaid/Medicare and self-pay.  °[facility] ° [address],  Phone: (336) 832-3150, Fax: (336) [phone]. Hours of Operation:  8:30 am - 5:30 pm, M-F.  Also accepts Medicaid and self-pay.  °[facility] [place] [address], [place] Phone: (336) 878-6027   °[facility] [address], [place], [place] (336)723-1848, Ext. 123 Mondays & Thursdays: 7-9 AM.  First 15 patients are seen on a first come, first serve basis. °  ° °Medicaid-accepting [place] Providers: ° °Organization         Address  Phone   Notes  °[facility] [address], [place] (336) 641-2100 Also accepts self-pay patients.  °[facility] [address], [place] ° (336) 856-9996   °[facility] [address], [place]  (336) 288-8857   °[facility] [address] [place] [address], [place] (336) 299-7000   °Veita Bland [address], [place]  ° (336) 373-1557 Only accepts [place] Access Medicaid patients after they have their name applied to their card.  ° °Self-Pay (no insurance) in [place]: ° °Organization         Address  Phone   Notes  °Sickle Cell Patients, [facility] [address], [place] (336) 832-1970   °[facility] [address], [place] (336) 832-4400   °[facility] ° [address], [place] (336) 992-4800   °[facility]/Dr. Osei-Bonsu ° [address] [place] [address], [place] or [address], [place] (336) 841-8500 Phone number for both [place] and [place] locations is the same.  °[facility] [address], [place] (336) 299-0000   °[facility] [address] [place] [address], [place] or [address] (336) 852-7530 °(336) 878-2260   °[facility] [address], [place] (336) 350-1642, phone; (336) 294-5005, fax Sees patients 1st and 3rd Saturday of every month.  Must not qualify for public or private insurance (i.e. Medicaid, Medicare, [place] Health Choice, Veterans' Benefits) • Household income should be no more than 200% of the poverty level •The clinic cannot treat you if you are pregnant or think you are pregnant • Sexually transmitted diseases are not treated at the clinic.  ° ° °Dental Care: °Organization         Address  Phone  Notes  °[place] [facility] [address], [place] (336) 641-6152 Accepts children up to age 21 who are enrolled in Medicaid or [place] Health Choice; pregnant women with a Medicaid card; and children who have applied for Medicaid or [place] Health Choice, but were declined, whose parents can pay a reduced fee at time of service.  °[place]  [facility] [place]  [address], [place] (336) 641-7733 Accepts children up to age 21 who are enrolled in Medicaid or [place] Health Choice; pregnant women with a Medicaid card; and children who have applied for Medicaid or [place] Health Choice, but were declined, whose parents can pay a reduced fee at time of service.  °[facility] ° [address], [place] (336) 641-4533 Patients are seen by appointment only. Walk-ins are not accepted. [facility] will see patients 18 years of age and older. °Monday - Tuesday (8am-5pm) °Most Wednesdays (8:30-5pm) °$30 per visit, cash only  °[facility] ° [address], [place] (336) 641-4533 Patients are seen by appointment only. Walk-ins are not accepted. [facility] will see patients 18 years of age and older. °One   Wednesday Evening (Monthly: Volunteer Based).  $30 per visit, cash only  °[facility]  (919) [phone] for adults; Children under age 4, call Graduate Pediatric Dentistry at ([phone]) [phone]. Children aged 4-14, please call ([phone]) [phone] to request a pediatric application. ° Dental services are provided in all areas of dental care including fillings, crowns and bridges, complete and partial dentures, implants, gum treatment, root canals, and extractions. Preventive care is also provided. Treatment is provided to both adults and children. °Patients are selected via a lottery and there is often a waiting list. °  °[facility] [address], °[place] ° (336) 763-8833 [email or web address] °  °[facility] [address], [place], [place] (336)723-1848, Ext. 123 Second and Fourth Thursday of each month, opens at 6:30 AM; Clinic ends at 9 AM.  Patients are seen on a first-come first-served basis, and a limited number are seen during each clinic.  ° °[facility] ° [address], [place], [place] (336) 723-7904    Eligibility Requirements °You must have lived in [place], or [place] counties for at least the last three months. °  You cannot be eligible for state or federal sponsored healthcare insurance, including Veterans Administration, Medicaid, or Medicare. °  You generally cannot be eligible for healthcare insurance through your employer.  °  How to apply: °Eligibility screenings are held every Tuesday and Wednesday afternoon from 1:00 pm until 4:00 pm. You do not need an appointment for the interview!  °[facility] [address], [place], [place] 336-631-2330   °[facility]  336-342-8273   °[facility]  336-703-3100   °[facility]  336-570-6415   ° °Behavioral Health Resources in the Community: °Intensive Outpatient Programs °Organization         Address  Phone  Notes  °[facility] [address], [place], [place] 336-878-6098   °[facility] [address], [place], [place] 336-832-9800   °[facility] [address], [place], [place] ° 336-882-2125   °[facility] [address],  °[place], [place] 1-800-853-5163 or 336-641-4981   °Substance Abuse Resources °Organization         Address  Phone  Notes  °[facility]  336-882-2125   °[facility]  336-784-9470   °[facility]  336-285-9073   °[facility]  336-845-3988   °Residential & Outpatient Substance Abuse Program  1-800-659-3381   °Psychological Services °Organization         Address  Phone  Notes  °[facility]  336- 832-9600   °[facility]  336- 378-7881   °[facility] [address], [place] 1-800-853-5163 or 336-641-4981   ° °Mobile Crisis Teams °Organization         Address  Phone  Notes  °[facility], Mobile Crisis Care Unit  1-877-626-1772   °Assertive °[facility] ° [address]  [place], [place] 336-834-9664   °Sharon DeEsch [address] °[place] 336-554-5454   ° °Self-Help/Support Groups °Organization         Address  Phone             Notes  °[facility]  - variety of support groups  336- 373-1402 Call for more information  °Narcotics Anonymous (NA), [facility] [address], °[place]   2 meetings at this location  ° °  Residential Treatment Programs Organization         Address  Phone  Notes  [facility] [address],    [place]  1-781-435-2126   [facility]  [address] [place] [id number], [place], [place]   [place], [place] (970)417-4100 Admissions: 8am-3pm M-F  [facility] [place] [address]    [place], [place] [id number]   [facility] [address] [place], [place], [place]   [facility] [address],  [place], [place]   [facility] Intensive Outpatient [address], [place], [place]   [facility] [address]  [place], [place] 1-403 689 5364 or 605-616-9138   [facility] ([facility] [address] [place], [place] Accepts Medicaid  [facility] [place] [address],  [place] 1-9376183084 Substance Abuse/Addiction Treatment   [facility]         Address  Phone  Notes  [facility]  (586) 462-9863   Domenic Schwab, PhD [address] [place], [place]   [phone] or [phone]   [facility], [place] 360-623-4307   [facility] [address], [place], [place] [phone] Insurance/Medicaid/sponsorship through [facility] [place] [facility] Families [address]                                    [place], [place] [phone] [place] [address], [place] ([phone]) [phone]    Dr. Adele Schilder  ([phone]) [phone]   [facility] Dept. 1) [address], [place] 2) [place] 3)  [address], [place] (684)[phone]  ([phone]   [place] [phone] or ([phone]) [phone] (After Hours)      Take only the medications you are prescribed, as directed.  Call your regular medical doctor and your mental health provider on Tuesday to schedule a follow up appointment within the next 3 days.  Return to the Emergency Department immediately sooner if worsening.

## 2016-01-01 ENCOUNTER — Encounter (INDEPENDENT_AMBULATORY_CARE_PROVIDER_SITE_OTHER): Payer: Self-pay | Admitting: Internal Medicine

## 2016-02-16 ENCOUNTER — Ambulatory Visit (HOSPITAL_COMMUNITY): Payer: Self-pay | Admitting: Psychiatry

## 2016-02-16 ENCOUNTER — Encounter (HOSPITAL_COMMUNITY): Payer: Self-pay | Admitting: Psychiatry

## 2016-05-07 ENCOUNTER — Emergency Department (HOSPITAL_COMMUNITY)
Admission: EM | Admit: 2016-05-07 | Discharge: 2016-05-07 | Disposition: A | Discharge status: Home / Self Care | Payer: Medicare Other | Attending: Emergency Medicine | Admitting: Emergency Medicine

## 2016-05-07 ENCOUNTER — Encounter (HOSPITAL_COMMUNITY): Payer: Self-pay | Admitting: *Deleted

## 2016-05-07 DIAGNOSIS — F17210 Nicotine dependence, cigarettes, uncomplicated: Secondary | ICD-10-CM | POA: Diagnosis not present

## 2016-05-07 DIAGNOSIS — J449 Chronic obstructive pulmonary disease, unspecified: Secondary | ICD-10-CM | POA: Insufficient documentation

## 2016-05-07 DIAGNOSIS — I10 Essential (primary) hypertension: Secondary | ICD-10-CM | POA: Diagnosis not present

## 2016-05-07 DIAGNOSIS — Z7982 Long term (current) use of aspirin: Secondary | ICD-10-CM | POA: Diagnosis not present

## 2016-05-07 DIAGNOSIS — E785 Hyperlipidemia, unspecified: Secondary | ICD-10-CM | POA: Diagnosis not present

## 2016-05-07 DIAGNOSIS — F1910 Other psychoactive substance abuse, uncomplicated: Secondary | ICD-10-CM | POA: Insufficient documentation

## 2016-05-07 DIAGNOSIS — F329 Major depressive disorder, single episode, unspecified: Secondary | ICD-10-CM | POA: Insufficient documentation

## 2016-05-07 MED ORDER — DICYCLOMINE HCL 20 MG PO TABS: 20.0000 mg | ORAL_TABLET | Freq: Four times a day (QID) | ORAL | Status: DC | PRN

## 2016-05-07 MED ORDER — ONDANSETRON HCL 4 MG PO TABS: 4.0000 mg | ORAL_TABLET | Freq: Four times a day (QID) | ORAL | Status: DC

## 2016-05-07 NOTE — Discharge Instructions (Signed)
Polysubstance Abuse When people abuse more than one drug or type of drug it is called polysubstance or polydrug abuse. For example, many smokers also drink alcohol. This is one form of polydrug abuse. Polydrug abuse also refers to the use of a drug to counteract an unpleasant effect produced by another drug. It may also be used to help with withdrawal from another drug. People who take stimulants may become agitated. Sometimes this agitation is countered with a tranquilizer. This helps protect against the unpleasant side effects. Polydrug abuse also refers to the use of different drugs at the same time.  Anytime drug use is interfering with normal living activities, it has become abuse. This includes problems with family and friends. Psychological dependence has developed when your mind tells you that the drug is needed. This is usually followed by physical dependence which has developed when continuing increases of drug are required to get the same feeling or "high". This is known as addiction or chemical dependency. A person's risk is much higher if there is a history of chemical dependency in the family. SIGNS OF CHEMICAL DEPENDENCY  You have been told by friends or family that drugs have become a problem.  You fight when using drugs.  You are having blackouts (not remembering what you do while using).  You feel sick from using drugs but continue using.  You lie about use or amounts of drugs (chemicals) used.  You need chemicals to get you going.  You are suffering in work performance or in school because of drug use.  You get sick from use of drugs but continue to use anyway.  You need drugs to relate to people or feel comfortable in social situations.  You use drugs to forget problems. "Yes" answered to any of the above signs of chemical dependency indicates there are problems. The longer the use of drugs continues, the greater the problems will become. If there is a family history of  drug or alcohol use, it is best not to experiment with these drugs. Continual use leads to tolerance. After tolerance develops more of the drug is needed to get the same feeling. This is followed by addiction. With addiction, drugs become the most important part of life. It becomes more important to take drugs than participate in the other usual activities of life. This includes relating to friends and family. Addiction is followed by dependency. Dependency is a condition where drugs are now needed not just to get high, but to feel normal. Addiction cannot be cured but it can be stopped. This often requires outside help and the care of professionals. Treatment centers are listed in the yellow pages under: Cocaine, Narcotics, and Alcoholics Anonymous. Most hospitals and clinics can refer you to a specialized care center. Talk to your caregiver if you need help.   This information is not intended to replace advice given to you by your health care provider. Make sure you discuss any questions you have with your health care provider.   Document Released: 07/28/2005 Document Revised: 02/28/2012 Document Reviewed: 12/11/2014 Elsevier Interactive Patient Education 2016 [facility] The [employer] 211 is a great source of information about community services available.  Access by dialing 2-1-1 from anywhere in [place], or by website -  CustodianSupply.fi.   Other Local Resources (Updated 12/2015)  [facility]  Crisis Hotline, available 24 hours a day,  day, 7 days a week: 800-939-5911 [place], [place]  ° [facility] • Crisis Hotline, available 24 hours a day, 7 days a week: 866-275-9552 [place], [place]  °[facility] • Suicide Prevention Hotline, available 24 hours a day, 7 days a week: 800-273-8255 [place], [place]  °[facility] ° • Crisis  Hotline, available 24 hours a day, 7 days a week: 336-676-6840 [place], [place] °  °[facility] Care [facility] • Crisis Hotline, available 24 hours a day, 7 days a week: 800-256-2452 All °  °[facility] • Crisis Hotline, available 24 hours a day, 7 days a week: 877-626-1772 All  ° °Other Local Resources (Updated 12/2015) ° °Outpatient Counseling/ Substance Abuse Programs  °Services  ° °  °Address and Phone Number  °[facility] ° • Options include Individual counseling, group counseling, intensive outpatient program (several hours a day, several days a week) °• Offers depression assessments °• Provides methadone maintenance program 336-333-6860 °[address] °[place], [place] [address] °  °[facility] ° • Offers partial hospitalization/day treatment and DUI/DWI programs °• Accepts Medicare, private insurance 336-299-4655 °[address] °[place], [place] [address]  °[facility] ° ° • Services include intensive outpatient program (several hours a day, several days a week), outpatient treatment, DUI/DWI services, family education °• Also has some services specifically for Veterans °• Offers transitional housing  336-886-5594 °[address] °[place], [place] [address] °  °  °[facility] • Accepts Medicare, private pay, and private insurance 336-272-0855 °[address] °[place], [place] [address]  °[facility] • Services include individual counseling, substance abuse intensive outpatient program (several hours a day, several days a week), day treatment °• Accepts Medicare, Medicaid, private insurance 336-271-5888 °[address] °[place], [place] [address]  °[facility] ° • Offers substance abuse intensive outpatient program (several hours a day, several days a week), partial hospitalization program 336-832-9800 °[address] °[place], [place] [address] ° °[phone] °[address] °[place], [place] [address] ° °336-386-3795 °[address] °[place], [place] [address] ° °[phone] °[address] °[place], [place] [address]  °[facility] • Individual counseling only °• Accepts private insurance only [phone] °[address] °[place], [place] [address]  °[facility] • Methadone maintenance program [phone] °[address] °[place], [place] [address]  °[facility] • Walk-In Clinic providing substance abuse and mental health counseling °• Accepts Medicaid, Medicare, private insurance °• Offers sliding scale for uninsured 336-342-8316 °[address] °[place], [place]   °[facility] • Offers individual counseling, and intensive in-home services 336-347-7415 °[address] °[place], [place] [address]  °[facility] • Offers individual counseling, family counseling, group therapy, domestic violence counseling, consumer credit counseling °• Accepts Medicare, Medicaid, private insurance °• Offers sliding scale for uninsured 336-387-6161 °[address] °, [place] [address] ° °336-889-6161 °[place], [address] °[place], [place] [address]  °[facility] • Offers individual, family and group counseling °• 3 locations - , [place], and [place] ° 336-899-8800 ° °[address] °, [place] [address] ° °[address] °[place], [place] [address] ° °[address] °[place],  [address]  °[facility]  ° • Offers psychiatric assessment, 8-week Intensive Outpatient Program (several hours a day, several times a week, daytime or evenings), early recovery group, family Program, medication management °•   pay or private insurance only [phone], or  [place], [place] [address]  [facility]  Offers individual, couples and family counseling  Accepts Medicaid,  private insurance, and sliding scale for uninsured [phone] [address], [place] [address]  Launa Flight, MD  Individual counseling  Private insurance [phone] [place], [place] [address]  [facility]   Offers assessment, substance abuse treatment, and behavioral health treatment [phone] [place], [place] [address]  [facility]  Individual counseling  Accepts private insurance [phone] [place], [place] [address]  [facility]  Individual counseling  Blinda Leatherwood, private insurance [phone] [place], [place] [address]  [facility]    Offers intensive outpatient program (several hours a day, several times a week)  Private pay, private insurance [phone] [place], [place]  Individual counseling  Medicare, private insurance [phone] [address], [place], [place] [address]  [facility]    Offers intensive outpatient program (several hours a day, several times a week) and partial hospitalization program [phone] [place], [place] [address]  Letta Moynahan, MD  Individual counseling [phone] [address], [place], [place] [address]  [facility] to individuals, couples, and families  Accepts Medicare and private insurance; offers sliding scale for uninsured [phone] [place], [place] [address]  [facility]  Christian counseling [phone] [address], [place], [place] [address]  [facility] [employer] crisis counseling, individual counseling, group therapy, in-home therapy, domestic violence services, day treatment, DWI services, Conservation officer, nature (CST),  Assertive Community Treatment Team (ACTT), substance abuse Intensive Outpatient Program (several hours a day, several times a week)  2 locations - [place] and [place], [place] [address]  [phone] [address] [place], [place] [address]  [facility] counseling and group therapy  [place] insurance, [place], [place] [phone] [address] [place], [place]  [facility]  Offers individual and family counseling  Offers LGBTQ services  Accepts private insurance and private pay [phone] [place], [place] [address]  [facility]    Offers individual counseling, group therapy, and outpatient detox  Accepts private insurance [phone] [place], [place] Medicare, private insurance [phone] [address], [place], [place] [address]  [employer]  Individual counseling  Accepts Medicare, private insurance [phone] [address] [place],  [address]  [facility] substance abuse Intensive Outpatient Program (several hours a day, several times a week) [phone], or [phone] [place], [facility]  [facility]stance Abuse Treatment Adults The [employer] Ways 211 is a great source of information about community services available.  Access by dialing 2-1-1 from anywhere in [place], or by website -  CustodianSupply.fi.   (Updated 12/2015)  Crisis Assistance 24 hours a day   [facility]  24-hour crisis assistance: [place], [place]   [facility]  24-hour crisis [place], [place]   24-hour crisis  assistance: [place], [place] Access to Care  Line  24-hour crisis assistance; (302)[phone] All   [facility]  24-hour crisis response line: (509)188-0211 All   Other Local Resources (Updated 12/2015)  Inpatient Behavioral Health/Residential Substance Abuse Treatment Programs   Services      Address and Phone Number  [facility] [place] [place])   14-day residential rehabilitation  [phone] [address] [place], [place] ([place])    Detox - private pay only  14-day residential rehabilitation -  Medicaid, insurance, private pay only [phone], or [place], [place], [place] [address]   [place] only  Multiple facilities [phone] admissions   [facility] ([place])   90-day program  Must be homeless to participate  [phone], or [place], [place] only [phone], or  [place], [place] [address]  [place]     Must make an appointment  Transportation is offered from [place] on [address].  Accepts private pay, [facility] [phone]  [address] [place], [place] [address]   [employer]  Females only  [facility] [phone] [place], [place] [address]  [facility]   Private insurance only [phone], or [phone] [address] [place], [id number]  [place]    Detox  Residential rehabilitation  Private insurance only  Multiple locations [phone] admissions  [facility] pay  Private insurance [phone] [address] [place] [address]  [facility]    Males only  Fee required at time of admission  [place], [place] [address]  [facility] pay only  [phone] [address] [place], [place]  [facility] ([facility])    Detox - private pay, Medicaid  Residential rehabilitation for males  - Medicare, Medicaid, insurance, private pay [phone] [place], [place] interviews Monday - Saturday from 8 am - 4 pm  Individuals with legal charges are not eligible [phone] [address] [place], [place] [address]  [facility]   Must be willing to work  Must attend Alcoholics Anonymous meetings [phone] [address] [place], [place]    Faith-based program  Private pay only [phone] [place], [place]

## 2016-05-07 NOTE — ED Notes (Addendum)
Pt comes in for detox of opiates, benzos, marijuana, and cocaine before she is to go to serve a jail sentence on 05/25/16. Pt states she last used xanax and pain pills today at 1300. Pt states she is prescribed her xanax but not the pain medication. Last use of marijuana was 05/05/16, last use of cocaine was 05/06/16. Pt's only complain of pain is in her back. This is a chronic issue that she has.   Pt denies any SI/HI thoughts

## 2016-05-14 NOTE — ED Provider Notes (Signed)
CSN: [id number]     Arrival date & time 05/07/16  1824 History   First MD Initiated Contact with Patient 05/07/16 1847     Chief Complaint  Patient presents with  . Detox      (Consider location/radiation/quality/duration/timing/severity/associated sxs/prior Treatment) HPI   Pt comes in for detox of opiates, benzos, marijuana, and cocaine before she is to go to serve a jail sentence on 05/25/16. Pt states she last used xanax and pain pills today at 1300. Pt states she is prescribed her xanax but not the pain medication. Last use of marijuana was 05/05/16, last use of cocaine was 05/06/16. Pt's only complain of pain is in her back. This is a chronic issue that she has.   Pt denies any SI/HI thoughts  Past Medical History  Diagnosis Date  . Disc degeneration   . Hyperlipemia     takes Pravastatin daily  . Brain tumor (benign) ([place])   . Pinched nerve in shoulder   . Degenerative disc disease   . Hypertension     takes Lotrel daily  . Depression     takes Effexor daily  . GERD (gastroesophageal reflux disease)     takes Omeprazole daily  . Anxiety     takes Xanax daily  . History of bronchitis     uses inhaler daily  . Pneumonia 2012  . Numbness in both hands   . Joint pain   . Chronic back pain     bulding disc  . Osteoporosis     Fosamax weekly  . Hepatitis     Hep C  . History of colon polyps   . History of staph infection   . Narcotic overdose 06/13/2015  . COPD (chronic obstructive pulmonary disease) ([place])   . Headache   . Chronic pain   . Substance abuse     benzos, opiates "from non-prescribed sources"   Past Surgical History  Procedure Laterality Date  . Knee surgery Right   . Facial fracture surgery      in the late 90's  . Ankle fracture surgery Bilateral early 2000's  . Tubal ligation    . Colonoscopy    . Orif calcaneous fracture Right 07/13/2013    Procedure: RIGHT OPEN REDUCTION INTERNAL FIXATION (ORIF) CALCANEUS FRACTURE;  Surgeon: Newt Minion, MD;   Location: [facility];  Service: Orthopedics;  Laterality: Right;   Family History  Problem Relation Age of Onset  . Bipolar disorder Daughter   . Drug abuse Son   . Drug abuse Grandchild   . Drug abuse Grandchild    Social History  Substance Use Topics  . Smoking status: Current Every Day Smoker -- 1.00 packs/day for 40 years    Types: Cigarettes  . Smokeless tobacco: Former Systems developer  . Alcohol Use: No     Comment: denies   OB History    Gravida Para Term Preterm AB TAB SAB Ectopic Multiple Living   3 2 2  1  1   2      Review of Systems  All systems reviewed and negative, other than as noted in HPI.   Allergies  Review of patient's allergies indicates no known allergies.  Home Medications   Prior to Admission medications   Medication Sig Start Date End Date Taking? Authorizing Provider  albuterol (PROVENTIL HFA;VENTOLIN HFA) 108 (90 BASE) MCG/ACT inhaler Inhale 2 puffs into the lungs every 6 (six) hours as needed for wheezing.    Historical Provider, MD  alendronate (FOSAMAX) 70  MG tablet Take 70 mg by mouth every 7 (seven) days. On mondays. Take with a full glass of water on an empty stomach.     Historical Provider, MD  ALPRAZolam Duanne Moron) 1 MG tablet Take 1 tablet (1 mg total) by mouth 5 (five) times daily. 10/08/15 10/07/16  Cloria Spring, MD  alprazolam Duanne Moron) 2 MG tablet Take 1 tablet (2 mg total) by mouth 3 (three) times daily as needed for anxiety. 07/10/15   Cloria Spring, MD  amLODipine-benazepril (LOTREL) 5-10 MG per capsule Take 1 capsule by mouth daily.    Historical Provider, MD  Aspirin-Acetaminophen-Caffeine (EXCEDRIN PO) Take 2 tablets by mouth 3 (three) times daily as needed (back and leg pain).    Historical Provider, MD  Calcium Carbonate-Vitamin D (CALCIUM + D PO) Take 1 tablet by mouth daily.     Historical Provider, MD  cyclobenzaprine (FLEXERIL) 5 MG tablet Take 5 mg by mouth 2 (two) times daily as needed. 05/07/16   Historical Provider, MD  dicyclomine  (BENTYL) 20 MG tablet Take 1 tablet (20 mg total) by mouth every 6 (six) hours as needed for spasms. 05/07/16   Virgel Manifold, MD  doxycycline (VIBRAMYCIN) 100 MG capsule Take 1 capsule (100 mg total) by mouth 2 (two) times daily. Patient not taking: Reported on 12/14/2015 11/25/15   Lily Kocher, PA-C  gabapentin (NEURONTIN) 800 MG tablet Take 800 mg by mouth 4 (four) times daily.    Historical Provider, MD  Nyoka Cowden Tea, Camillia sinensis, (GREEN TEA PO) Take 1 tablet by mouth daily.    Historical Provider, MD  omeprazole (PRILOSEC) 20 MG capsule Take 20 mg by mouth daily.    Historical Provider, MD  ondansetron (ZOFRAN) 4 MG tablet Take 1 tablet (4 mg total) by mouth every 6 (six) hours. 05/07/16   Virgel Manifold, MD  pravastatin (PRAVACHOL) 20 MG tablet Take 20 mg by mouth daily.    Historical Provider, MD  predniSONE (DELTASONE) 10 MG tablet 5,4,3,2,1 - take with food Patient not taking: Reported on 12/14/2015 11/25/15   Lily Kocher, PA-C  tiotropium (SPIRIVA HANDIHALER) 18 MCG inhalation capsule Place 1 capsule (18 mcg total) into inhaler and inhale daily. Pharmacist please instruct in proper use. 05/29/15   Samuella Cota, MD  traZODone (DESYREL) 150 MG tablet Take 150 mg by mouth at bedtime. 04/05/16   Historical Provider, MD  venlafaxine XR (EFFEXOR-XR) 150 MG 24 hr capsule Take 2 capsules (300 mg total) by mouth daily. 10/08/15   Cloria Spring, MD   BP 100/51 mmHg  Pulse 74  Temp(Src) 98.2 F (36.8 C) (Oral)  Resp 18  Ht 5\' 2"  (1.575 m)  Wt 150 lb 11.2 oz (68.357 kg)  BMI 27.56 kg/m2  SpO2 100% Physical Exam  Constitutional: She is oriented to person, place, and time. She appears well-developed and well-nourished. No distress.  HENT:  Head: Normocephalic and atraumatic.  Eyes: Conjunctivae are normal. Right eye exhibits no discharge. Left eye exhibits no discharge.  Neck: Neck supple.  Cardiovascular: Normal rate, regular rhythm and normal heart sounds.  Exam reveals no gallop and  no friction rub.   No murmur heard. Pulmonary/Chest: Effort normal and breath sounds normal. No respiratory distress.  Abdominal: Soft. She exhibits no distension. There is no tenderness.  Musculoskeletal: She exhibits no edema or tenderness.  Neurological: She is alert and oriented to person, place, and time.  Skin: Skin is warm and dry.  Psychiatric: She has a normal mood and affect. Her behavior  is normal. Thought content normal.  Nursing note and vitals reviewed.   ED Course  Procedures (including critical care time) Labs Review Labs Reviewed - No data to display  Imaging Review No results found. I have personally reviewed and evaluated these images and lab results as part of my medical decision-making.   EKG Interpretation None      MDM   Final diagnoses:  Polysubstance abuse    64yF requesting detox. No si,hi,psychosis. Resources provided.     Virgel Manifold, MD 05/14/16 [phone]

## 2016-08-04 IMAGING — DX DG CHEST 2V
2 series · 2 of 2 positions shown · non-contrast
Comparison: 06/23/2015

CLINICAL DATA: Nausea, poor appetite, productive cough

EXAM:
CHEST  2 VIEW

[chest pa]
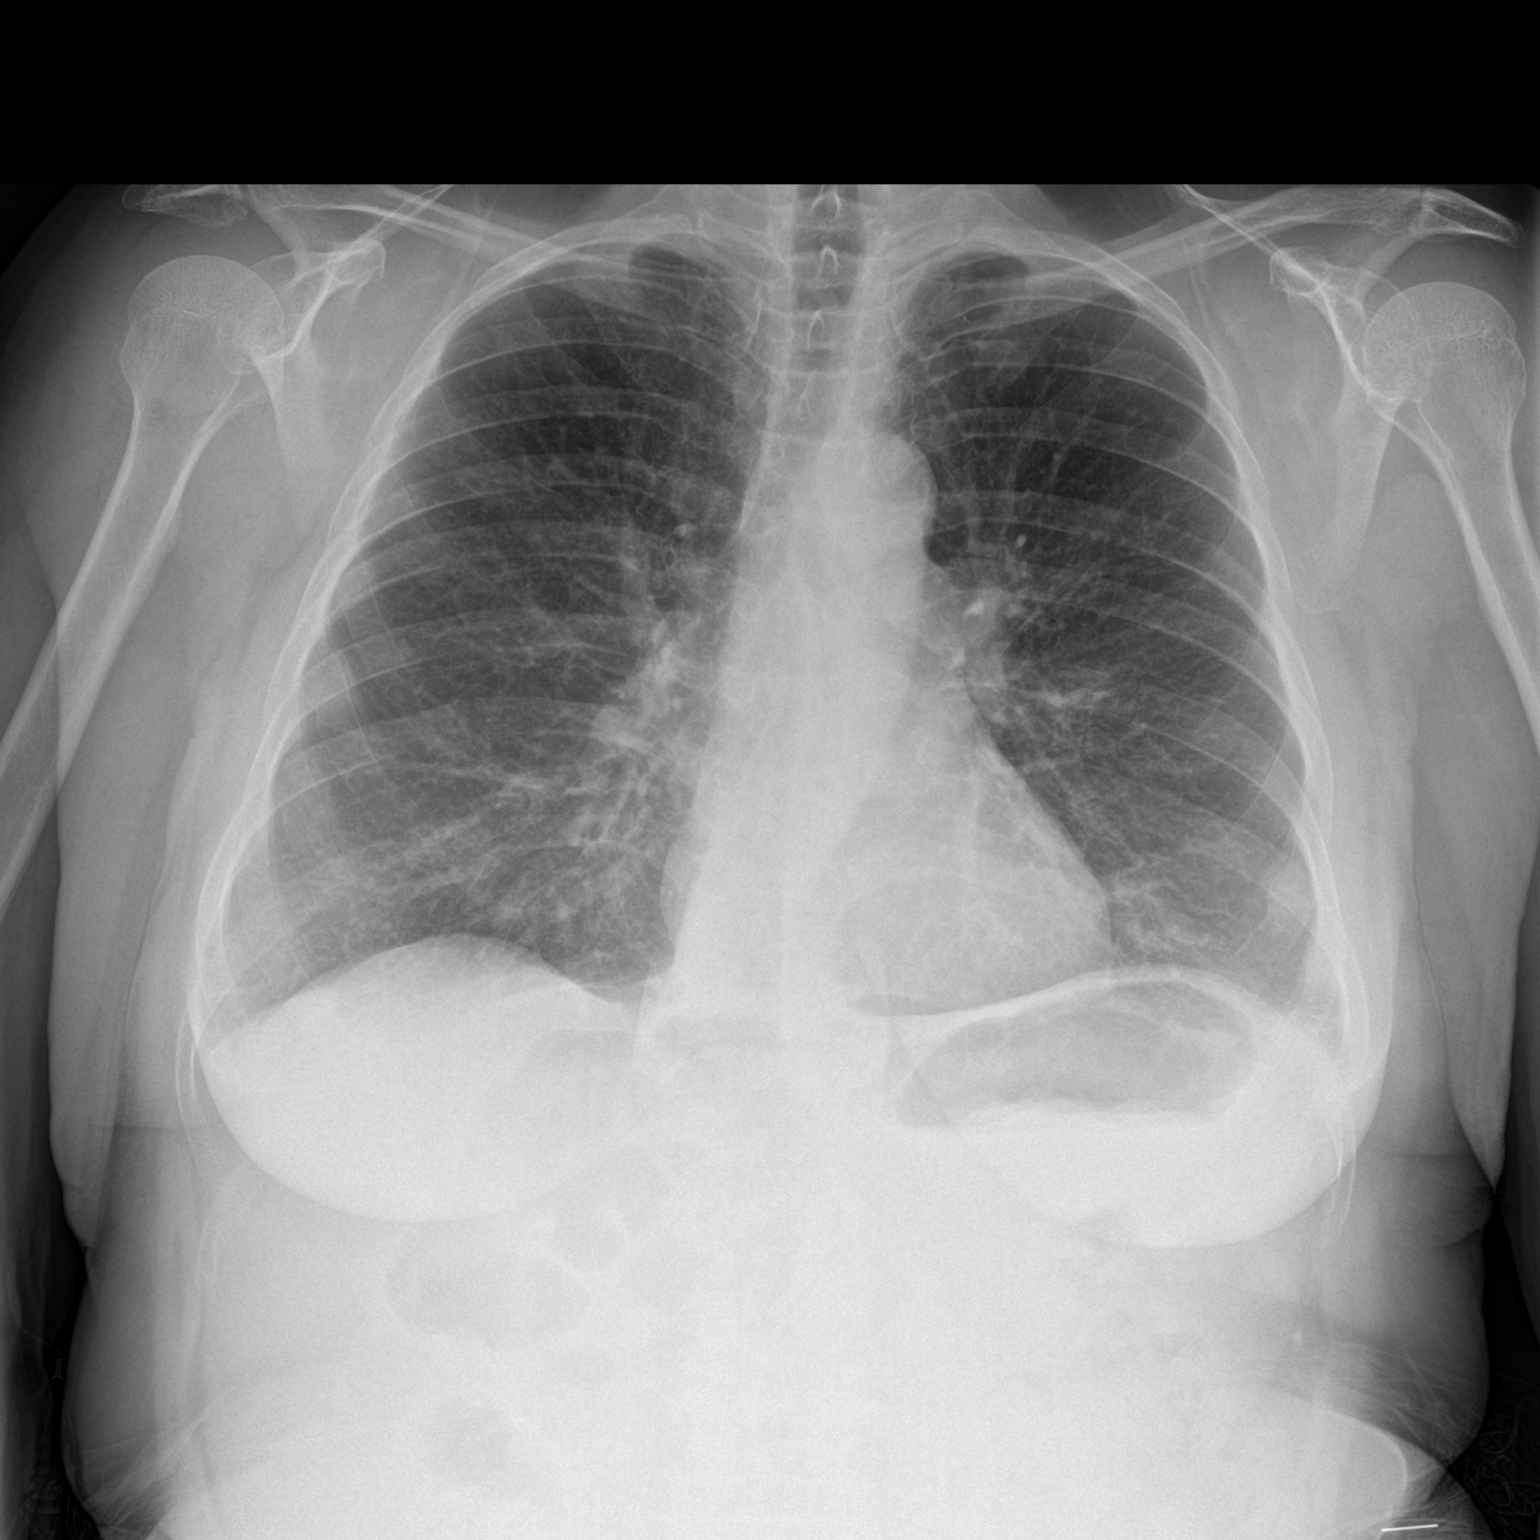

[chest lat]
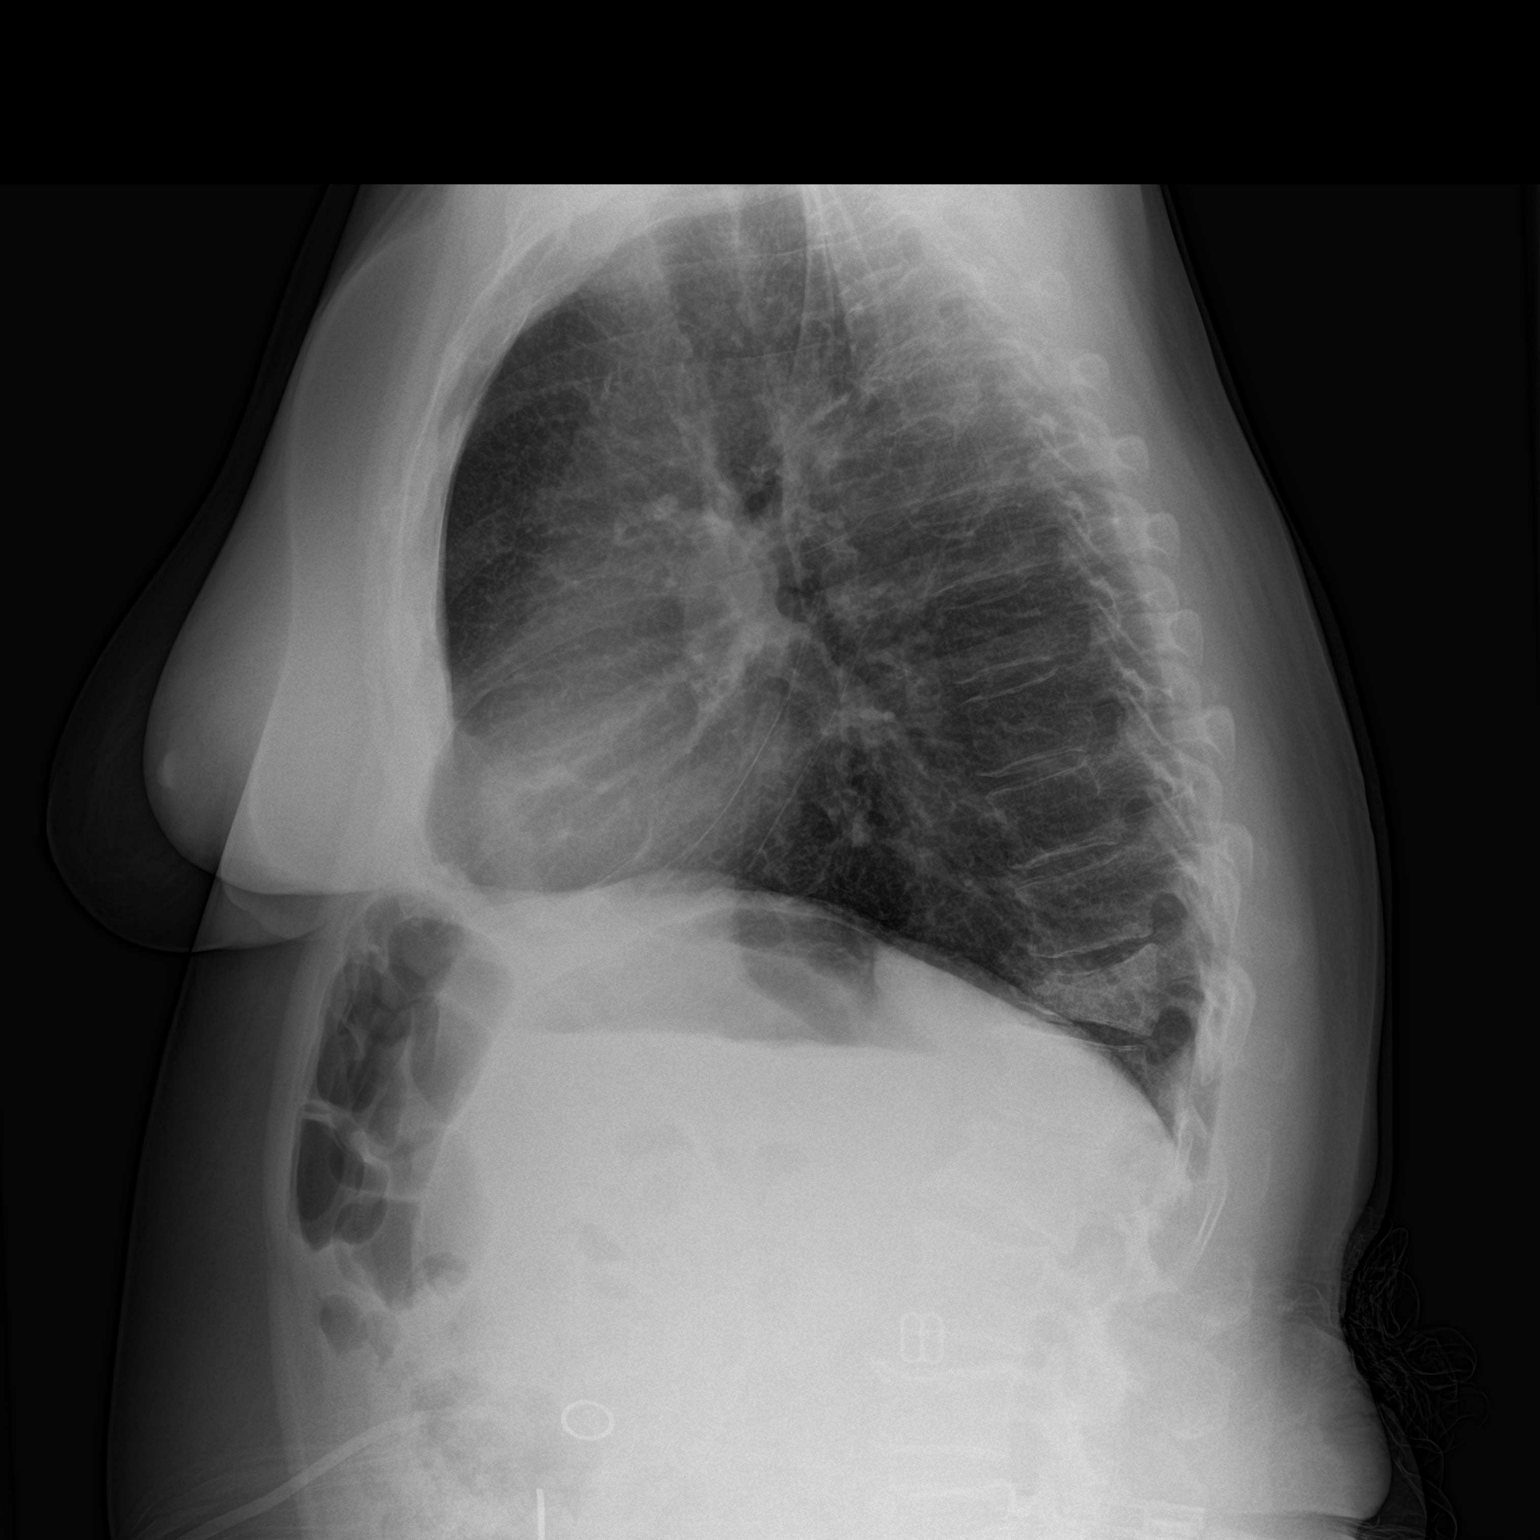

[2 of 2 positions shown; findings below may reference images not displayed]

FINDINGS: Mild left basilar opacity, likely atelectasis. No focal
consolidation. No pleural effusion or pneumothorax.

The heart is normal in size.

Stable lower thoracic compression fracture deformity.
IMPRESSION: No evidence of acute cardiopulmonary disease.

## 2016-08-25 ENCOUNTER — Emergency Department (HOSPITAL_COMMUNITY): Payer: Medicare Other

## 2016-08-25 ENCOUNTER — Emergency Department (HOSPITAL_COMMUNITY)
Admission: EM | Admit: 2016-08-25 | Discharge: 2016-08-25 | Disposition: A | Discharge status: Home / Self Care | Payer: Medicare Other | Attending: Emergency Medicine | Admitting: Emergency Medicine

## 2016-08-25 ENCOUNTER — Encounter (HOSPITAL_COMMUNITY): Payer: Self-pay | Admitting: *Deleted

## 2016-08-25 DIAGNOSIS — Z79899 Other long term (current) drug therapy: Secondary | ICD-10-CM | POA: Insufficient documentation

## 2016-08-25 DIAGNOSIS — F17210 Nicotine dependence, cigarettes, uncomplicated: Secondary | ICD-10-CM | POA: Insufficient documentation

## 2016-08-25 DIAGNOSIS — R51 Headache: Secondary | ICD-10-CM | POA: Insufficient documentation

## 2016-08-25 DIAGNOSIS — I10 Essential (primary) hypertension: Secondary | ICD-10-CM | POA: Insufficient documentation

## 2016-08-25 DIAGNOSIS — J449 Chronic obstructive pulmonary disease, unspecified: Secondary | ICD-10-CM | POA: Insufficient documentation

## 2016-08-25 DIAGNOSIS — R05 Cough: Secondary | ICD-10-CM | POA: Diagnosis present

## 2016-08-25 DIAGNOSIS — J40 Bronchitis, not specified as acute or chronic: Secondary | ICD-10-CM | POA: Diagnosis not present

## 2016-08-25 LAB — COMPREHENSIVE METABOLIC PANEL
ALT: 11 U/L — ABNORMAL LOW (ref 14–54)
ANION GAP: 6 (ref 5–15)
AST: 22 U/L (ref 15–41)
Albumin: 3.4 g/dL — ABNORMAL LOW (ref 3.5–5.0)
Alkaline Phosphatase: 54 U/L (ref 38–126)
BUN: 14 mg/dL (ref 6–20)
CHLORIDE: 106 mmol/L (ref 101–111)
CO2: 27 mmol/L (ref 22–32)
Calcium: 8.7 mg/dL — ABNORMAL LOW (ref 8.9–10.3)
Creatinine, Ser: 1.23 mg/dL — ABNORMAL HIGH (ref 0.44–1.00)
GFR, EST AFRICAN AMERICAN: 53 mL/min — AB (ref >60)
GFR, EST NON AFRICAN AMERICAN: 45 mL/min — AB (ref >60)
Glucose, Bld: 80 mg/dL (ref 65–99)
Potassium: 4.3 mmol/L (ref 3.5–5.1)
SODIUM: 139 mmol/L (ref 135–145)
Total Bilirubin: 0.3 mg/dL (ref 0.3–1.2)
Total Protein: 6.7 g/dL (ref 6.5–8.1)

## 2016-08-25 LAB — CBC WITH DIFFERENTIAL/PLATELET
Basophils Absolute: 0.1 10*3/uL (ref 0.0–0.1)
Basophils Relative: 1 %
EOS ABS: 0.3 10*3/uL (ref 0.0–0.7)
EOS PCT: 3 %
HCT: 38.1 % (ref 36.0–46.0)
Hemoglobin: 12.3 g/dL (ref 12.0–15.0)
LYMPHS ABS: 3.7 10*3/uL (ref 0.7–4.0)
Lymphocytes Relative: 43 %
MCH: 30.1 pg (ref 26.0–34.0)
MCHC: 32.3 g/dL (ref 30.0–36.0)
MCV: 93.2 fL (ref 78.0–100.0)
MONO ABS: 0.7 10*3/uL (ref 0.1–1.0)
MONOS PCT: 9 %
Neutro Abs: 3.7 10*3/uL (ref 1.7–7.7)
Neutrophils Relative %: 44 %
PLATELETS: 235 10*3/uL (ref 150–400)
RBC: 4.09 MIL/uL (ref 3.87–5.11)
RDW: 15.3 % (ref 11.5–15.5)
WBC: 8.5 10*3/uL (ref 4.0–10.5)

## 2016-08-25 MED ORDER — IPRATROPIUM-ALBUTEROL 0.5-2.5 (3) MG/3ML IN SOLN
3.0000 mL | Freq: Once | RESPIRATORY_TRACT | Status: AC
  Administered 2016-08-25: 3 mL via RESPIRATORY_TRACT
  Filled 2016-08-25: qty 3

## 2016-08-25 MED ORDER — SODIUM CHLORIDE 0.9 % IV BOLUS (SEPSIS)
500.0000 mL | Freq: Once | INTRAVENOUS | Status: AC
  Administered 2016-08-25: 500 mL via INTRAVENOUS

## 2016-08-25 MED ORDER — METHYLPREDNISOLONE SODIUM SUCC 125 MG IJ SOLR
125.0000 mg | Freq: Once | INTRAMUSCULAR | Status: AC
  Administered 2016-08-25: 125 mg via INTRAVENOUS
  Filled 2016-08-25: qty 2

## 2016-08-25 MED ORDER — OXYCODONE-ACETAMINOPHEN 5-325 MG PO TABS
1.0000 | ORAL_TABLET | Freq: Once | ORAL | Status: AC
  Administered 2016-08-25: 1 via ORAL
  Filled 2016-08-25: qty 1

## 2016-08-25 MED ORDER — ALBUTEROL SULFATE (2.5 MG/3ML) 0.083% IN NEBU
2.5000 mg | INHALATION_SOLUTION | Freq: Once | RESPIRATORY_TRACT | Status: AC
  Administered 2016-08-25: 2.5 mg via RESPIRATORY_TRACT
  Filled 2016-08-25: qty 3

## 2016-08-25 MED ORDER — PREDNISONE 10 MG PO TABS: 20.0000 mg | ORAL_TABLET | Freq: Every day | ORAL | 0 refills | Status: DC

## 2016-08-25 MED ORDER — HYDROCODONE-ACETAMINOPHEN 5-325 MG PO TABS: 1.0000 | ORAL_TABLET | Freq: Four times a day (QID) | ORAL | 0 refills | Status: DC | PRN

## 2016-08-25 MED ORDER — AZITHROMYCIN 250 MG PO TABS: ORAL_TABLET | ORAL | 0 refills | Status: DC

## 2016-08-25 NOTE — ED Provider Notes (Signed)
[facility] DEPT Provider Note   CSN: [id number] Arrival date & time: 08/25/16  1136     History   Chief Complaint Chief Complaint  Patient presents with  . Cough    HPI [name] is a 65 y.o. female.  Patient complains of cough shortness of breath   The history is provided by the patient.  Cough  This is a recurrent problem. The current episode started 12 to 24 hours ago. The problem occurs constantly. The problem has not changed since onset.The cough is non-productive. There has been no fever. Associated symptoms include wheezing. Pertinent negatives include no chest pain, no chills and no headaches. She has tried nothing for the symptoms. The treatment provided no relief. Risk factors: COPD. She is a smoker. Her past medical history does not include pneumonia.    Past Medical History:  Diagnosis Date  . Anxiety    takes Xanax daily  . Brain tumor (benign) ([place])   . Chronic back pain    bulding disc  . Chronic pain   . COPD (chronic obstructive pulmonary disease) ([place])   . Degenerative disc disease   . Depression    takes Effexor daily  . Disc degeneration   . GERD (gastroesophageal reflux disease)    takes Omeprazole daily  . Headache   . Hepatitis    Hep C  . History of bronchitis    uses inhaler daily  . History of colon polyps   . History of staph infection   . Hyperlipemia    takes Pravastatin daily  . Hypertension    takes Lotrel daily  . Joint pain   . Narcotic overdose 06/13/2015  . Numbness in both hands   . Osteoporosis    Fosamax weekly  . Pinched nerve in shoulder   . Pneumonia 2012  . Substance abuse    benzos, opiates "from non-prescribed sources"    Patient Active Problem List   Diagnosis Date Noted  . Acute respiratory failure with hypercapnia ([place]) 06/16/2015  . Acute respiratory acidosis 06/16/2015  . COPD (chronic obstructive pulmonary disease) ([place]) 06/16/2015  . Narcotic overdose 06/15/2015  . Tobacco dependence  05/28/2015  . Acute respiratory failure with hypoxia ([place]) 05/26/2015  . CAP (community acquired pneumonia) 05/25/2015  . Generalized anxiety disorder 01/22/2014    Past Surgical History:  Procedure Laterality Date  . ANKLE FRACTURE SURGERY Bilateral early 2000's  . COLONOSCOPY    . FACIAL FRACTURE SURGERY     in the late 90's  . KNEE SURGERY Right   . ORIF CALCANEOUS FRACTURE Right 07/13/2013   Procedure: RIGHT OPEN REDUCTION INTERNAL FIXATION (ORIF) CALCANEUS FRACTURE;  Surgeon: Newt Minion, MD;  Location: [facility];  Service: Orthopedics;  Laterality: Right;  . TUBAL LIGATION      OB History    Gravida Para Term Preterm AB Living   3 2 2   1 2    SAB TAB Ectopic Multiple Live Births   1               Home Medications    Prior to Admission medications   Medication Sig Start Date End Date Taking? Authorizing Provider  albuterol (PROVENTIL HFA;VENTOLIN HFA) 108 (90 BASE) MCG/ACT inhaler Inhale 2 puffs into the lungs every 6 (six) hours as needed for wheezing.   Yes Historical Provider, MD  alendronate (FOSAMAX) 70 MG tablet Take 70 mg by mouth every 7 (seven) days. On mondays. Take with a full glass of water on an empty  stomach.    Yes Historical Provider, MD  alprazolam Duanne Moron) 2 MG tablet Take 1 tablet (2 mg total) by mouth 3 (three) times daily as needed for anxiety. 07/10/15  Yes Cloria Spring, MD  amLODipine-benazepril (LOTREL) 5-10 MG per capsule Take 1 capsule by mouth daily.   Yes Historical Provider, MD  Aspirin-Acetaminophen-Caffeine (EXCEDRIN PO) Take 2 tablets by mouth 3 (three) times daily as needed (back and leg pain).   Yes Historical Provider, MD  cyclobenzaprine (FLEXERIL) 5 MG tablet Take 5 mg by mouth 2 (two) times daily as needed. 05/07/16  Yes Historical Provider, MD  gabapentin (NEURONTIN) 800 MG tablet Take 800 mg by mouth 4 (four) times daily.   Yes Historical Provider, MD  Nyoka Cowden Tea, Camillia sinensis, (GREEN TEA PO) Take 1 tablet by mouth daily.   Yes  Historical Provider, MD  omeprazole (PRILOSEC) 20 MG capsule Take 20 mg by mouth daily.   Yes Historical Provider, MD  pravastatin (PRAVACHOL) 20 MG tablet Take 20 mg by mouth daily.   Yes Historical Provider, MD  tiotropium (SPIRIVA HANDIHALER) 18 MCG inhalation capsule Place 1 capsule (18 mcg total) into inhaler and inhale daily. Pharmacist please instruct in proper use. 05/29/15  Yes Samuella Cota, MD  traZODone (DESYREL) 150 MG tablet Take 150 mg by mouth at bedtime. 04/05/16  Yes Historical Provider, MD  venlafaxine XR (EFFEXOR-XR) 150 MG 24 hr capsule Take 2 capsules (300 mg total) by mouth daily. 10/08/15  Yes Cloria Spring, MD  azithromycin (ZITHROMAX Z-PAK) 250 MG tablet 2 po day one, then 1 daily x 4 days 08/25/16   Milton Ferguson, MD  dicyclomine (BENTYL) 20 MG tablet Take 1 tablet (20 mg total) by mouth every 6 (six) hours as needed for spasms. Patient not taking: Reported on 08/25/2016 05/07/16   Virgel Manifold, MD  HYDROcodone-acetaminophen (NORCO/VICODIN) 5-325 MG tablet Take 1 tablet by mouth every 6 (six) hours as needed for moderate pain. 08/25/16   Milton Ferguson, MD  predniSONE (DELTASONE) 10 MG tablet Take 2 tablets (20 mg total) by mouth daily. 08/25/16   Milton Ferguson, MD    Family History Family History  Problem Relation Age of Onset  . Bipolar disorder Daughter   . Drug abuse Son   . Drug abuse Grandchild   . Drug abuse Grandchild     Social History Social History  Substance Use Topics  . Smoking status: Current Every Day Smoker    Packs/day: 1.00    Years: 40.00    Types: Cigarettes  . Smokeless tobacco: Former Systems developer  . Alcohol use No     Comment: denies     Allergies   Review of patient's allergies indicates no known allergies.   Review of Systems Review of Systems  Constitutional: Negative for appetite change, chills and fatigue.  HENT: Negative for congestion, ear discharge and sinus pressure.   Eyes: Negative for discharge.  Respiratory: Positive for  cough and wheezing.   Cardiovascular: Negative for chest pain.  Gastrointestinal: Negative for abdominal pain and diarrhea.  Genitourinary: Negative for frequency and hematuria.  Musculoskeletal: Negative for back pain.  Skin: Negative for rash.  Neurological: Negative for seizures and headaches.  Psychiatric/Behavioral: Negative for hallucinations.     Physical Exam Updated Vital Signs BP 132/78 (BP Location: Right Arm)   Pulse 87   Temp 97.6 F (36.4 C) (Temporal)   Resp 17   Ht 5\' 2"  (1.575 m)   Wt 152 lb (68.9 kg)   SpO2 91%  BMI 27.80 kg/m   Physical Exam  Constitutional: She is oriented to person, place, and time. She appears well-developed.  HENT:  Head: Normocephalic.  Eyes: Conjunctivae and EOM are normal. No scleral icterus.  Neck: Neck supple. No thyromegaly present.  Cardiovascular: Normal rate and regular rhythm.  Exam reveals no gallop and no friction rub.   No murmur heard. Pulmonary/Chest: No stridor. She has wheezes. She has no rales. She exhibits no tenderness.  Abdominal: She exhibits no distension. There is no tenderness. There is no rebound.  Musculoskeletal: Normal range of motion. She exhibits no edema.  Lymphadenopathy:    She has no cervical adenopathy.  Neurological: She is oriented to person, place, and time. She exhibits normal muscle tone. Coordination normal.  Skin: No rash noted. No erythema.  Psychiatric: She has a normal mood and affect. Her behavior is normal.     ED Treatments / Results  Labs (all labs ordered are listed, but only abnormal results are displayed) Labs Reviewed  COMPREHENSIVE METABOLIC PANEL - Abnormal; Notable for the following:       Result Value   Creatinine, Ser 1.23 (*)    Calcium 8.7 (*)    Albumin 3.4 (*)    ALT 11 (*)    GFR calc non Af Amer 45 (*)    GFR calc Af Amer 53 (*)    All other components within normal limits  CBC WITH DIFFERENTIAL/PLATELET    EKG  EKG Interpretation None        Radiology Dg Chest 2 View  Result Date: 08/25/2016 CLINICAL DATA:  Productive cough with history of pneumonia over the past week. Patient fell backwards in the bathtub last night and has had posterior chest wall pain since. History of current smoking, COPD. EXAM: CHEST  2 VIEW COMPARISON:  PA and lateral chest x-ray of November 25, 2015 FINDINGS: The lungs remain hyperinflated with mild hemidiaphragm flattening and increased AP dimension of the thorax. The heart and pulmonary vascularity are normal. There is calcification in the wall of the aortic arch. The mediastinum is normal in width. There is no pleural effusion. There is stable approximately 70% anterior wedge compression of T11. IMPRESSION: COPD. No pneumonia, CHF, nor other acute cardiopulmonary abnormality. Chronic T11 vertebral compression. The observed ribcage exhibits no acute abnormality. Electronically Signed   By: David  Martinique M.D.   On: 08/25/2016 12:33   Ct Head Wo Contrast  Result Date: 08/25/2016 CLINICAL DATA:  Pain following fall 1 week prior EXAM: CT HEAD WITHOUT CONTRAST CT CERVICAL SPINE WITHOUT CONTRAST TECHNIQUE: Multidetector CT imaging of the head and cervical spine was performed following the standard protocol without intravenous contrast. Multiplanar CT image reconstructions of the cervical spine were also generated. COMPARISON:  Head CT December 14, 2015 FINDINGS: CT HEAD FINDINGS Brain: The ventricles are normal in size and configuration. There is no intracranial mass, hemorrhage, extra-axial fluid collection, or midline shift. Gray-white compartments appear normal. No acute infarct evident. Vascular: There are no hyperdense vessels. There is calcification in each carotid siphon region. Skull: Extradural calcification in the anterior right temporal region is stable without surrounding edema. The bony calvarium appears intact. Sinuses/Orbits: There is an air-fluid level in the left maxillary antrum. There is mucosal  thickening in both maxillary antra as well as in several anterior ethmoid air cells. Orbits appear symmetric and unremarkable bilaterally. Other: There is opacification of several inferior mastoid air cells on the right. Mastoids elsewhere are clear bilaterally. CT CERVICAL SPINE FINDINGS Alignment: There is  no appreciable spondylolisthesis. Skull base and vertebrae: Skull base appears unremarkable. There is no demonstrable fracture. Soft tissues and spinal canal: Prevertebral soft tissues and predental space regions are normal. There is no high-grade spinal stenosis evident. Disc levels: There is moderate disc space narrowing at C5-6 and C6-7. There is slight disc space narrowing at C7-T1. Disc spaces otherwise appear unremarkable. There is facet hypertrophy at multiple levels bilaterally. There is no nerve root edema or effacement. Upper chest: There is mild bullous disease in the lung apices. Lung apices otherwise appear clear. Other: There are foci of carotid artery calcification bilaterally. IMPRESSION: CT head: No intracranial mass, hemorrhage, or extra-axial fluid collection. Gray-white compartments are normal. Stable benign-appearing calcification anteriorly in the right temporal region, extradural in location. Areas of vascular calcification in the carotid siphon regions. There is paranasal sinus disease with an air-fluid level in the left maxillary antrum. There is opacification of several inferior mastoid air cells on the right. Mastoids elsewhere clear. CT cervical spine: No demonstrable fracture or spondylolisthesis. Osteoarthritic change at multiple levels. There is calcification in both carotid arteries. Electronically Signed   By: Lowella Grip III M.D.   On: 08/25/2016 15:45   Ct Cervical Spine Wo Contrast  Result Date: 08/25/2016 CLINICAL DATA:  Pain following fall 1 week prior EXAM: CT HEAD WITHOUT CONTRAST CT CERVICAL SPINE WITHOUT CONTRAST TECHNIQUE: Multidetector CT imaging of the head  and cervical spine was performed following the standard protocol without intravenous contrast. Multiplanar CT image reconstructions of the cervical spine were also generated. COMPARISON:  Head CT December 14, 2015 FINDINGS: CT HEAD FINDINGS Brain: The ventricles are normal in size and configuration. There is no intracranial mass, hemorrhage, extra-axial fluid collection, or midline shift. Gray-white compartments appear normal. No acute infarct evident. Vascular: There are no hyperdense vessels. There is calcification in each carotid siphon region. Skull: Extradural calcification in the anterior right temporal region is stable without surrounding edema. The bony calvarium appears intact. Sinuses/Orbits: There is an air-fluid level in the left maxillary antrum. There is mucosal thickening in both maxillary antra as well as in several anterior ethmoid air cells. Orbits appear symmetric and unremarkable bilaterally. Other: There is opacification of several inferior mastoid air cells on the right. Mastoids elsewhere are clear bilaterally. CT CERVICAL SPINE FINDINGS Alignment: There is no appreciable spondylolisthesis. Skull base and vertebrae: Skull base appears unremarkable. There is no demonstrable fracture. Soft tissues and spinal canal: Prevertebral soft tissues and predental space regions are normal. There is no high-grade spinal stenosis evident. Disc levels: There is moderate disc space narrowing at C5-6 and C6-7. There is slight disc space narrowing at C7-T1. Disc spaces otherwise appear unremarkable. There is facet hypertrophy at multiple levels bilaterally. There is no nerve root edema or effacement. Upper chest: There is mild bullous disease in the lung apices. Lung apices otherwise appear clear. Other: There are foci of carotid artery calcification bilaterally. IMPRESSION: CT head: No intracranial mass, hemorrhage, or extra-axial fluid collection. Gray-white compartments are normal. Stable benign-appearing  calcification anteriorly in the right temporal region, extradural in location. Areas of vascular calcification in the carotid siphon regions. There is paranasal sinus disease with an air-fluid level in the left maxillary antrum. There is opacification of several inferior mastoid air cells on the right. Mastoids elsewhere clear. CT cervical spine: No demonstrable fracture or spondylolisthesis. Osteoarthritic change at multiple levels. There is calcification in both carotid arteries. Electronically Signed   By: Lowella Grip III M.D.   On: 08/25/2016 15:45  Procedures Procedures (including critical care time)  Medications Ordered in ED Medications  ipratropium-albuterol (DUONEB) 0.5-2.5 (3) MG/3ML nebulizer solution 3 mL (3 mLs Nebulization Given 08/25/16 1413)  methylPREDNISolone sodium succinate (SOLU-MEDROL) 125 mg/2 mL injection 125 mg (125 mg Intravenous Given 08/25/16 1513)  oxyCODONE-acetaminophen (PERCOCET/ROXICET) 5-325 MG per tablet 1 tablet (1 tablet Oral Given 08/25/16 1500)  sodium chloride 0.9 % bolus 500 mL (0 mLs Intravenous Stopped 08/25/16 1604)  albuterol (PROVENTIL) (2.5 MG/3ML) 0.083% nebulizer solution 2.5 mg (2.5 mg Nebulization Given 08/25/16 1413)     Initial Impression / Assessment and Plan / ED Course  I have reviewed the triage vital signs and the nursing notes.  Pertinent labs & imaging results that were available during my care of the patient were reviewed by me and considered in my medical decision making (see chart for details).  Clinical Course    Patient with bronchitis and bronchospasm. She will be sent home with Z-Pak prednisone and some Vicodin for pain will follow-up PCP  Final Clinical Impressions(s) / ED Diagnoses   Final diagnoses:  Bronchitis    New Prescriptions New Prescriptions   AZITHROMYCIN (ZITHROMAX Z-PAK) 250 MG TABLET    2 po day one, then 1 daily x 4 days   HYDROCODONE-ACETAMINOPHEN (NORCO/VICODIN) 5-325 MG TABLET    Take 1 tablet by  mouth every 6 (six) hours as needed for moderate pain.   PREDNISONE (DELTASONE) 10 MG TABLET    Take 2 tablets (20 mg total) by mouth daily.     Milton Ferguson, MD 08/25/16 [phone]

## 2016-08-25 NOTE — ED Triage Notes (Signed)
Pt comes in for a fall that started 1 week ago. She states that today she began feeling worse. Also, she states she fell last night and hit her right eye on the bathtub. Pt is unsure what made her fall. Pt is alert and oriented at this time.

## 2016-08-25 NOTE — Discharge Instructions (Signed)
Follow up with your md next week. °

## 2016-10-12 ENCOUNTER — Other Ambulatory Visit (HOSPITAL_COMMUNITY): Payer: Self-pay | Admitting: Internal Medicine

## 2016-10-12 DIAGNOSIS — R945 Abnormal results of liver function studies: Secondary | ICD-10-CM

## 2016-10-25 ENCOUNTER — Ambulatory Visit (HOSPITAL_COMMUNITY)
Admission: RE | Admit: 2016-10-25 | Discharge: 2016-10-25 | Disposition: A | Discharge status: Home / Self Care | Payer: Medicare Other | Source: Ambulatory Visit | Attending: Internal Medicine | Admitting: Internal Medicine

## 2016-10-25 ENCOUNTER — Ambulatory Visit (HOSPITAL_COMMUNITY): Payer: Medicare Other

## 2016-10-25 DIAGNOSIS — R945 Abnormal results of liver function studies: Secondary | ICD-10-CM | POA: Diagnosis not present

## 2016-10-28 ENCOUNTER — Emergency Department (HOSPITAL_COMMUNITY)
Admission: EM | Admit: 2016-10-28 | Discharge: 2016-10-28 | Disposition: A | Discharge status: Home / Self Care | Payer: Medicare Other | Attending: Emergency Medicine | Admitting: Emergency Medicine

## 2016-10-28 ENCOUNTER — Encounter (HOSPITAL_COMMUNITY): Payer: Self-pay | Admitting: Emergency Medicine

## 2016-10-28 DIAGNOSIS — F17210 Nicotine dependence, cigarettes, uncomplicated: Secondary | ICD-10-CM | POA: Diagnosis not present

## 2016-10-28 DIAGNOSIS — X58XXXD Exposure to other specified factors, subsequent encounter: Secondary | ICD-10-CM | POA: Insufficient documentation

## 2016-10-28 DIAGNOSIS — S99921D Unspecified injury of right foot, subsequent encounter: Secondary | ICD-10-CM | POA: Diagnosis present

## 2016-10-28 DIAGNOSIS — S92354D Nondisplaced fracture of fifth metatarsal bone, right foot, subsequent encounter for fracture with routine healing: Secondary | ICD-10-CM | POA: Diagnosis not present

## 2016-10-28 DIAGNOSIS — Z7982 Long term (current) use of aspirin: Secondary | ICD-10-CM | POA: Insufficient documentation

## 2016-10-28 DIAGNOSIS — I10 Essential (primary) hypertension: Secondary | ICD-10-CM | POA: Diagnosis not present

## 2016-10-28 DIAGNOSIS — J449 Chronic obstructive pulmonary disease, unspecified: Secondary | ICD-10-CM | POA: Insufficient documentation

## 2016-10-28 DIAGNOSIS — Z79899 Other long term (current) drug therapy: Secondary | ICD-10-CM | POA: Insufficient documentation

## 2016-10-28 MED ORDER — OXYCODONE-ACETAMINOPHEN 5-325 MG PO TABS: 1.0000 | ORAL_TABLET | Freq: Four times a day (QID) | ORAL | 0 refills | Status: DC | PRN

## 2016-10-28 MED ORDER — OXYCODONE-ACETAMINOPHEN 5-325 MG PO TABS
1.0000 | ORAL_TABLET | Freq: Once | ORAL | Status: AC
  Administered 2016-10-28: 1 via ORAL
  Filled 2016-10-28: qty 1

## 2016-10-28 NOTE — ED Provider Notes (Signed)
[facility] DEPT Provider Note   CSN: [id number] Arrival date & time: 10/28/16  1905   By signing my name below, I, Delton Prairie, attest that this documentation has been prepared under the direction and in the presence of  Aubryanna Nesheim, PA-C. Electronically Signed: Delton Prairie, ED Scribe. 10/28/16. 7:55 PM.   History   Chief Complaint Chief Complaint  Patient presents with  . Foot Pain   The history is provided by the patient. No language interpreter was used.   HPI Comments:  [name] is a 65 y.o. female who presents to the Emergency Department complaining of sudden onset moderate right foot and heel pain x 2 weeks. Pt notes exacerbation of pain with walking. She states she visited [facility] 2 days ago, diagnosed with a foot fracture, and placed in a splint with crutches. Patient has not been using her crutches. She states that her splint has gotten wet in the rain. Patient adds that she is concerned that the people at the [facility] "wrapped the foot up wrong" because she is still having pain. Patient has an upcoming appointment with Dr. Sharol Given, orthopedic surgeon. Patient denies neuro deficits, other injuries, or any other complaints.  A printed copy of the patient's x-ray was noted to have a nondisplaced right fifth metatarsal shaft fracture.   Past Medical History:  Diagnosis Date  . Anxiety    takes Xanax daily  . Brain tumor (benign) ([place])   . Chronic back pain    bulding disc  . Chronic pain   . COPD (chronic obstructive pulmonary disease) ([employer])   . Degenerative disc disease   . Depression    takes Effexor daily  . Disc degeneration   . GERD (gastroesophageal reflux disease)    takes Omeprazole daily  . Headache   . Hepatitis    Hep C  . History of bronchitis    uses inhaler daily  . History of colon polyps   . History of staph infection   . Hyperlipemia    takes Pravastatin daily  . Hypertension    takes Lotrel daily  . Joint pain   .  Narcotic overdose 06/13/2015  . Numbness in both hands   . Osteoporosis    Fosamax weekly  . Pinched nerve in shoulder   . Pneumonia 2012  . Substance abuse    benzos, opiates "from non-prescribed sources"    Patient Active Problem List   Diagnosis Date Noted  . Acute respiratory failure with hypercapnia ([place]) 06/16/2015  . Acute respiratory acidosis 06/16/2015  . COPD (chronic obstructive pulmonary disease) ([place]) 06/16/2015  . Narcotic overdose 06/15/2015  . Tobacco dependence 05/28/2015  . Acute respiratory failure with hypoxia ([place]) 05/26/2015  . CAP (community acquired pneumonia) 05/25/2015  . Generalized anxiety disorder 01/22/2014    Past Surgical History:  Procedure Laterality Date  . ANKLE FRACTURE SURGERY Bilateral early 2000's  . COLONOSCOPY    . FACIAL FRACTURE SURGERY     in the late 90's  . KNEE SURGERY Right   . ORIF CALCANEOUS FRACTURE Right 07/13/2013   Procedure: RIGHT OPEN REDUCTION INTERNAL FIXATION (ORIF) CALCANEUS FRACTURE;  Surgeon: Newt Minion, MD;  Location: [place];  Service: Orthopedics;  Laterality: Right;  . TUBAL LIGATION      OB History    Gravida Para Term Preterm AB Living   3 2 2   1 2    SAB TAB Ectopic Multiple Live Births   1  Home Medications    Prior to Admission medications   Medication Sig Start Date End Date Taking? Authorizing Provider  albuterol (PROVENTIL HFA;VENTOLIN HFA) 108 (90 BASE) MCG/ACT inhaler Inhale 2 puffs into the lungs every 6 (six) hours as needed for wheezing.    Historical Provider, MD  alendronate (FOSAMAX) 70 MG tablet Take 70 mg by mouth every 7 (seven) days. On mondays. Take with a full glass of water on an empty stomach.     Historical Provider, MD  alprazolam Duanne Moron) 2 MG tablet Take 1 tablet (2 mg total) by mouth 3 (three) times daily as needed for anxiety. 07/10/15   Cloria Spring, MD  amLODipine-benazepril (LOTREL) 5-10 MG per capsule Take 1 capsule by mouth daily.    Historical Provider,  MD  Aspirin-Acetaminophen-Caffeine (EXCEDRIN PO) Take 2 tablets by mouth 3 (three) times daily as needed (back and leg pain).    Historical Provider, MD  azithromycin (ZITHROMAX Z-PAK) 250 MG tablet 2 po day one, then 1 daily x 4 days 08/25/16   Milton Ferguson, MD  cyclobenzaprine (FLEXERIL) 5 MG tablet Take 5 mg by mouth 2 (two) times daily as needed. 05/07/16   Historical Provider, MD  dicyclomine (BENTYL) 20 MG tablet Take 1 tablet (20 mg total) by mouth every 6 (six) hours as needed for spasms. Patient not taking: Reported on 08/25/2016 05/07/16   Virgel Manifold, MD  gabapentin (NEURONTIN) 800 MG tablet Take 800 mg by mouth 4 (four) times daily.    Historical Provider, MD  Nyoka Cowden Tea, Camillia sinensis, (GREEN TEA PO) Take 1 tablet by mouth daily.    Historical Provider, MD  HYDROcodone-acetaminophen (NORCO/VICODIN) 5-325 MG tablet Take 1 tablet by mouth every 6 (six) hours as needed for moderate pain. 08/25/16   Milton Ferguson, MD  omeprazole (PRILOSEC) 20 MG capsule Take 20 mg by mouth daily.    Historical Provider, MD  oxyCODONE-acetaminophen (PERCOCET/ROXICET) 5-325 MG tablet Take 1-2 tablets by mouth every 6 (six) hours as needed for severe pain. 10/28/16   Aleathea Pugmire C Karsynn Deweese, PA-C  pravastatin (PRAVACHOL) 20 MG tablet Take 20 mg by mouth daily.    Historical Provider, MD  predniSONE (DELTASONE) 10 MG tablet Take 2 tablets (20 mg total) by mouth daily. 08/25/16   Milton Ferguson, MD  tiotropium (SPIRIVA HANDIHALER) 18 MCG inhalation capsule Place 1 capsule (18 mcg total) into inhaler and inhale daily. Pharmacist please instruct in proper use. 05/29/15   Samuella Cota, MD  traZODone (DESYREL) 150 MG tablet Take 150 mg by mouth at bedtime. 04/05/16   Historical Provider, MD  venlafaxine XR (EFFEXOR-XR) 150 MG 24 hr capsule Take 2 capsules (300 mg total) by mouth daily. 10/08/15   Cloria Spring, MD    Family History Family History  Problem Relation Age of Onset  . Bipolar disorder Daughter   . Drug abuse Son     . Drug abuse Grandchild   . Drug abuse Grandchild   . Diabetes Sister   . Cancer Sister     Social History Social History  Substance Use Topics  . Smoking status: Current Every Day Smoker    Packs/day: 1.00    Years: 40.00    Types: Cigarettes  . Smokeless tobacco: Former Systems developer  . Alcohol use No     Comment: denies    Allergies   Patient has no known allergies.   Review of Systems Review of Systems  Musculoskeletal: Positive for arthralgias.  Skin: Negative for wound.  Neurological: Negative for weakness  and numbness.   Physical Exam Updated Vital Signs BP 126/61 (BP Location: Left Arm)   Pulse 83   Wt 151 lb (68.5 kg)   SpO2 92%   BMI 27.62 kg/m   Physical Exam  Constitutional: She appears well-developed and well-nourished. No distress.  HENT:  Head: Normocephalic and atraumatic.  Eyes: Conjunctivae are normal.  Neck: Neck supple.  Cardiovascular: Normal rate and regular rhythm.   Pulmonary/Chest: Effort normal.  Musculoskeletal:  Right 5th metatarsal fracture noted on x-ray printout.  Patient is noted to be in a short leg splint. Patient has covered the splint with a sock. The sock and the splint are both saturated with water. Patient is neurovascularly intact in the toes.  Neurological: She is alert.  Skin: Skin is warm and dry. Capillary refill takes less than 2 seconds. She is not diaphoretic.  Psychiatric: She has a normal mood and affect. Her behavior is normal.  Nursing note and vitals reviewed.   ED Treatments / Results  DIAGNOSTIC STUDIES:  Oxygen Saturation is 92% on RA, adequate by my interpretation.    COORDINATION OF CARE:  7:46 PM Discussed treatment plan with pt at bedside and pt agreed to plan.  Labs (all labs ordered are listed, but only abnormal results are displayed) Labs Reviewed - No data to display  EKG  EKG Interpretation None       Radiology No results found.  Procedures Procedures (including critical care  time)  Medications Ordered in ED Medications  oxyCODONE-acetaminophen (PERCOCET/ROXICET) 5-325 MG per tablet 1 tablet (1 tablet Oral Given 10/28/16 2023)     Initial Impression / Assessment and Plan / ED Course  I have reviewed the triage vital signs and the nursing notes.  Pertinent labs & imaging results that were available during my care of the patient were reviewed by me and considered in my medical decision making (see chart for details).  Clinical Course     Patient presents for reevaluation of her fractured foot. Although the patient is concerned with how the foot was splinted, she is neurovascularly intact in the toes and the splint could have stayed on had it not been completely soaked through with water. The splint was removed. Patient was placed in a cam walker. Patient to follow up with Dr. Sharol Given, as scheduled.     Final Clinical Impressions(s) / ED Diagnoses   Final diagnoses:  Nondisplaced fracture of fifth metatarsal bone, right foot, subsequent encounter for fracture with routine healing    New Prescriptions Discharge Medication List as of 10/28/2016  8:00 PM     I personally performed the services described in this documentation, which was scribed in my presence. The recorded information has been reviewed and is accurate.    Lorayne Bender, PA-C 10/28/16 2257    Dorie Rank, MD 10/29/16 1028

## 2016-10-28 NOTE — ED Triage Notes (Signed)
Pt states she has been having right foot pain for 2 weeks and went to [facility] 2 days ago and found out she had fractured her foot   Pt states they wrapped it and today the way it is wrapped is causing her to have pain in her right heel

## 2016-10-28 NOTE — Discharge Instructions (Signed)
Even with the cam walker, stay off of the right foot as much as possible. Elevate it whenever possible. Ibuprofen or naproxen for pain management. Follow up with Dr. Sharol Given, as scheduled.

## 2016-11-01 ENCOUNTER — Ambulatory Visit (INDEPENDENT_AMBULATORY_CARE_PROVIDER_SITE_OTHER): Payer: Medicare Other | Admitting: Orthopedic Surgery

## 2016-11-01 ENCOUNTER — Ambulatory Visit (INDEPENDENT_AMBULATORY_CARE_PROVIDER_SITE_OTHER): Payer: Medicare Other

## 2016-11-01 ENCOUNTER — Encounter (INDEPENDENT_AMBULATORY_CARE_PROVIDER_SITE_OTHER): Payer: Self-pay | Admitting: Orthopedic Surgery

## 2016-11-01 VITALS — Ht 62.0 in | Wt 151.0 lb

## 2016-11-01 DIAGNOSIS — S92354A Nondisplaced fracture of fifth metatarsal bone, right foot, initial encounter for closed fracture: Secondary | ICD-10-CM | POA: Diagnosis not present

## 2016-11-01 DIAGNOSIS — F17210 Nicotine dependence, cigarettes, uncomplicated: Secondary | ICD-10-CM | POA: Diagnosis not present

## 2016-11-01 DIAGNOSIS — Z716 Tobacco abuse counseling: Secondary | ICD-10-CM

## 2016-11-01 DIAGNOSIS — M79671 Pain in right foot: Secondary | ICD-10-CM | POA: Diagnosis not present

## 2016-11-01 MED ORDER — OXYCODONE-ACETAMINOPHEN 10-325 MG PO TABS: 1.0000 | ORAL_TABLET | ORAL | 0 refills | Status: DC | PRN

## 2016-11-01 NOTE — Progress Notes (Signed)
Office Visit Note   Patient: [name]           Date of Birth: 1951/10/12           MRN: [id number] Visit Date: 11/01/2016              Requested by: Celene Squibb, MD [address] [place], [place] [address] PCP: Wende Neighbors, MD   Assessment & Plan: Visit Diagnoses:  1. Closed nondisplaced fracture of fifth metatarsal bone of right foot, initial encounter   2. Pain in right foot     Plan: Patient was given instruction for protected weightbearing with a fracture boot and crutches. She is given a prescription for Percocet 10. She states that nothing smaller will work for her. Discussed with the Jones fracture patient has increased risk of potential for surgical intervention. Recommended protected weightbearing with the fracture boot and crutches. Patient is recommended smoking cessation. She is currently on Fosamax 70 mg a week follow-up in 4 weeks.  Three-view radiographs of the right foot at follow-up. Return in about 4 weeks (around 11/29/2016).   Orders:  Orders Placed This Encounter  Procedures  . XR Foot Complete Right   Meds ordered this encounter  Medications  . oxyCODONE-acetaminophen (PERCOCET) 10-325 MG tablet    Sig: Take 1 tablet by mouth every 4 (four) hours as needed for pain.    Dispense:  60 tablet    Refill:  0      Procedures: No procedures performed   Clinical Data: No additional findings.   Subjective: Chief Complaint  Patient presents with  . Right Foot - Fracture    Nondisplaced 5th metatarsal shaft fracture    Patient presents today with right foot pain. She is status post nondisplaced fracture right 5th metatarsal shaft fracture. She was initially seen at [facility] ER placed in a splint and given crutches. She had gotten her splint wet in the rain and presented to [facility] ER two days later. She was given cam walker boot to replace her splint. She does complain of some swelling. She has prior history ORIF calcaneus fracture from  06/2013.    Review of Systems   Objective: Vital Signs: Ht 5\' 2"  (1.575 m)   Wt 151 lb (68.5 kg)   BMI 27.62 kg/m   Physical Exam on examination patient is alert oriented no enough we will dress the left humerus toward she has an antalgic gait using her fracture boot. She is tender to palpation over the base of the fifth metatarsal. There is no skin breakdown and no cellulitis no signs of infection.  Ortho Exam  Specialty Comments:  No specialty comments available.  Imaging: Xr Foot Complete Right  Result Date: 11/01/2016 Review the radiographs shows a nondisplaced fracture of the fifth metatarsal at the metaphyseal diaphyseal junction consistent with a Jones fracture.    PMFS History: Patient Active Problem List   Diagnosis Date Noted  . Acute respiratory failure with hypercapnia ([place]) 06/16/2015  . Acute respiratory acidosis 06/16/2015  . COPD (chronic obstructive pulmonary disease) ([place]) 06/16/2015  . Narcotic overdose 06/15/2015  . Tobacco dependence 05/28/2015  . Acute respiratory failure with hypoxia ([place]) 05/26/2015  . CAP (community acquired pneumonia) 05/25/2015  . Generalized anxiety disorder 01/22/2014   Past Medical History:  Diagnosis Date  . Anxiety    takes Xanax daily  . Brain tumor (benign) ([place])   . Chronic back pain    bulding disc  . Chronic pain   .  COPD (chronic obstructive pulmonary disease) ([place])   . Degenerative disc disease   . Depression    takes Effexor daily  . Disc degeneration   . GERD (gastroesophageal reflux disease)    takes Omeprazole daily  . Headache   . Hepatitis    Hep C  . History of bronchitis    uses inhaler daily  . History of colon polyps   . History of staph infection   . Hyperlipemia    takes Pravastatin daily  . Hypertension    takes Lotrel daily  . Joint pain   . Narcotic overdose 06/13/2015  . Numbness in both hands   . Osteoporosis    Fosamax weekly  . Pinched nerve in shoulder   . Pneumonia 2012   . Substance abuse    benzos, opiates "from non-prescribed sources"    Family History  Problem Relation Age of Onset  . Bipolar disorder Daughter   . Drug abuse Son   . Drug abuse Grandchild   . Drug abuse Grandchild   . Diabetes Sister   . Cancer Sister     Past Surgical History:  Procedure Laterality Date  . ANKLE FRACTURE SURGERY Bilateral early 2000's  . COLONOSCOPY    . FACIAL FRACTURE SURGERY     in the late 90's  . KNEE SURGERY Right   . ORIF CALCANEOUS FRACTURE Right 07/13/2013   Procedure: RIGHT OPEN REDUCTION INTERNAL FIXATION (ORIF) CALCANEUS FRACTURE;  Surgeon: Newt Minion, MD;  Location: [place];  Service: Orthopedics;  Laterality: Right;  . TUBAL LIGATION     Social History   Occupational History  . Not on file.   Social History Main Topics  . Smoking status: Current Every Day Smoker    Packs/day: 1.00    Years: 40.00    Types: Cigarettes  . Smokeless tobacco: Former Systems developer  . Alcohol use No     Comment: denies  . Drug use: No  . Sexual activity: Yes    Birth control/ protection: Post-menopausal

## 2016-11-15 ENCOUNTER — Emergency Department (HOSPITAL_COMMUNITY)
Admission: EM | Admit: 2016-11-15 | Discharge: 2016-11-16 | Disposition: A | Discharge status: Home / Self Care | Payer: Medicare Other | Attending: Emergency Medicine | Admitting: Emergency Medicine

## 2016-11-15 ENCOUNTER — Encounter (HOSPITAL_COMMUNITY): Payer: Self-pay | Admitting: *Deleted

## 2016-11-15 ENCOUNTER — Emergency Department (HOSPITAL_COMMUNITY): Payer: Medicare Other

## 2016-11-15 DIAGNOSIS — Z7982 Long term (current) use of aspirin: Secondary | ICD-10-CM | POA: Diagnosis not present

## 2016-11-15 DIAGNOSIS — S42291A Other displaced fracture of upper end of right humerus, initial encounter for closed fracture: Secondary | ICD-10-CM

## 2016-11-15 DIAGNOSIS — I10 Essential (primary) hypertension: Secondary | ICD-10-CM | POA: Diagnosis not present

## 2016-11-15 DIAGNOSIS — J449 Chronic obstructive pulmonary disease, unspecified: Secondary | ICD-10-CM | POA: Insufficient documentation

## 2016-11-15 DIAGNOSIS — S42201A Unspecified fracture of upper end of right humerus, initial encounter for closed fracture: Secondary | ICD-10-CM | POA: Insufficient documentation

## 2016-11-15 DIAGNOSIS — F17210 Nicotine dependence, cigarettes, uncomplicated: Secondary | ICD-10-CM | POA: Insufficient documentation

## 2016-11-15 DIAGNOSIS — W01198A Fall on same level from slipping, tripping and stumbling with subsequent striking against other object, initial encounter: Secondary | ICD-10-CM | POA: Diagnosis not present

## 2016-11-15 DIAGNOSIS — Y999 Unspecified external cause status: Secondary | ICD-10-CM | POA: Insufficient documentation

## 2016-11-15 DIAGNOSIS — Y939 Activity, unspecified: Secondary | ICD-10-CM | POA: Diagnosis not present

## 2016-11-15 DIAGNOSIS — S4991XA Unspecified injury of right shoulder and upper arm, initial encounter: Secondary | ICD-10-CM | POA: Diagnosis present

## 2016-11-15 DIAGNOSIS — Y929 Unspecified place or not applicable: Secondary | ICD-10-CM | POA: Diagnosis not present

## 2016-11-15 DIAGNOSIS — Z79899 Other long term (current) drug therapy: Secondary | ICD-10-CM | POA: Diagnosis not present

## 2016-11-15 NOTE — ED Triage Notes (Signed)
Pt tripped over her boot and landed on her right shoulder; pt unable to lift arm above head

## 2016-11-16 MED ORDER — OXYCODONE-ACETAMINOPHEN 5-325 MG PO TABS: 1.0000 | ORAL_TABLET | ORAL | 0 refills | Status: DC | PRN

## 2016-11-16 NOTE — ED Provider Notes (Signed)
[name] Provider Note   CSN: [id number] Arrival date & time: 11/15/16  2337     History   Chief Complaint Chief Complaint  Patient presents with  . Fall    HPI ERSEL TAHIR is a 65 y.o. female with past medical history as outlined below significant for a right 5th metatarsal fracture for which she is currently in a Cam walker.  She states she tripped because of the cam walker and landed against the edge of a kitchen counter with her right shoulder.  Since the event this evening she has been unable to raise her right arm secondary to severe pain in the shoulder joint.  She denies other injury including head injury and has no weakness or numbness in her arms or hands.  She has had no treatment for this new injury.  She states she was taking oxycodone for her foot fracture but took her last tablet several days ago.  The history is provided by the patient.    Past Medical History:  Diagnosis Date  . Anxiety    takes Xanax daily  . Brain tumor (benign) ([place])   . Chronic back pain    bulding disc  . Chronic pain   . COPD (chronic obstructive pulmonary disease) ([place])   . Degenerative disc disease   . Depression    takes Effexor daily  . Disc degeneration   . GERD (gastroesophageal reflux disease)    takes Omeprazole daily  . Headache   . Hepatitis    Hep C  . History of bronchitis    uses inhaler daily  . History of colon polyps   . History of staph infection   . Hyperlipemia    takes Pravastatin daily  . Hypertension    takes Lotrel daily  . Joint pain   . Narcotic overdose 06/13/2015  . Numbness in both hands   . Osteoporosis    Fosamax weekly  . Pinched nerve in shoulder   . Pneumonia 2012  . Substance abuse    benzos, opiates "from non-prescribed sources"    Patient Active Problem List   Diagnosis Date Noted  . Acute respiratory failure with hypercapnia ([place]) 06/16/2015  . Acute respiratory acidosis 06/16/2015  . COPD (chronic obstructive  pulmonary disease) ([place]) 06/16/2015  . Narcotic overdose 06/15/2015  . Tobacco dependence 05/28/2015  . Acute respiratory failure with hypoxia ([place]) 05/26/2015  . CAP (community acquired pneumonia) 05/25/2015  . Generalized anxiety disorder 01/22/2014    Past Surgical History:  Procedure Laterality Date  . ANKLE FRACTURE SURGERY Bilateral early 2000's  . COLONOSCOPY    . FACIAL FRACTURE SURGERY     in the late 90's  . KNEE SURGERY Right   . ORIF CALCANEOUS FRACTURE Right 07/13/2013   Procedure: RIGHT OPEN REDUCTION INTERNAL FIXATION (ORIF) CALCANEUS FRACTURE;  Surgeon: Newt Minion, MD;  Location: [place];  Service: Orthopedics;  Laterality: Right;  . TUBAL LIGATION      OB History    Gravida Para Term Preterm AB Living   3 2 2   1 2    SAB TAB Ectopic Multiple Live Births   1               Home Medications    Prior to Admission medications   Medication Sig Start Date End Date Taking? Authorizing Provider  albuterol (PROVENTIL HFA;VENTOLIN HFA) 108 (90 BASE) MCG/ACT inhaler Inhale 2 puffs into the lungs every 6 (six) hours as needed for wheezing.  Historical Provider, MD  alendronate (FOSAMAX) 70 MG tablet Take 70 mg by mouth every 7 (seven) days. On mondays. Take with a full glass of water on an empty stomach.     Historical Provider, MD  alprazolam Duanne Moron) 2 MG tablet Take 1 tablet (2 mg total) by mouth 3 (three) times daily as needed for anxiety. 07/10/15   Cloria Spring, MD  amLODipine-benazepril (LOTREL) 5-10 MG per capsule Take 1 capsule by mouth daily.    Historical Provider, MD  Aspirin-Acetaminophen-Caffeine (EXCEDRIN PO) Take 2 tablets by mouth 3 (three) times daily as needed (back and leg pain).    Historical Provider, MD  azithromycin (ZITHROMAX Z-PAK) 250 MG tablet 2 po day one, then 1 daily x 4 days 08/25/16   Milton Ferguson, MD  cyclobenzaprine (FLEXERIL) 5 MG tablet Take 5 mg by mouth 2 (two) times daily as needed. 05/07/16   Historical Provider, MD  dicyclomine  (BENTYL) 20 MG tablet Take 1 tablet (20 mg total) by mouth every 6 (six) hours as needed for spasms. 05/07/16   Virgel Manifold, MD  gabapentin (NEURONTIN) 800 MG tablet Take 800 mg by mouth 4 (four) times daily.    Historical Provider, MD  Nyoka Cowden Tea, Camillia sinensis, (GREEN TEA PO) Take 1 tablet by mouth daily.    Historical Provider, MD  HYDROcodone-acetaminophen (NORCO/VICODIN) 5-325 MG tablet Take 1 tablet by mouth every 6 (six) hours as needed for moderate pain. Patient not taking: Reported on 11/01/2016 08/25/16   Milton Ferguson, MD  omeprazole (PRILOSEC) 20 MG capsule Take 20 mg by mouth daily.    Historical Provider, MD  oxyCODONE-acetaminophen (PERCOCET/ROXICET) 5-325 MG tablet Take 1-2 tablets by mouth every 4 (four) hours as needed. 11/16/16   Evalee Jefferson, PA-C  pravastatin (PRAVACHOL) 20 MG tablet Take 20 mg by mouth daily.    Historical Provider, MD  predniSONE (DELTASONE) 10 MG tablet Take 2 tablets (20 mg total) by mouth daily. 08/25/16   Milton Ferguson, MD  tiotropium (SPIRIVA HANDIHALER) 18 MCG inhalation capsule Place 1 capsule (18 mcg total) into inhaler and inhale daily. Pharmacist please instruct in proper use. 05/29/15   Samuella Cota, MD  traZODone (DESYREL) 150 MG tablet Take 150 mg by mouth at bedtime. 04/05/16   Historical Provider, MD  venlafaxine XR (EFFEXOR-XR) 150 MG 24 hr capsule Take 2 capsules (300 mg total) by mouth daily. 10/08/15   Cloria Spring, MD    Family History Family History  Problem Relation Age of Onset  . Bipolar disorder Daughter   . Drug abuse Son   . Drug abuse Grandchild   . Drug abuse Grandchild   . Diabetes Sister   . Cancer Sister     Social History Social History  Substance Use Topics  . Smoking status: Current Every Day Smoker    Packs/day: 1.00    Years: 40.00    Types: Cigarettes  . Smokeless tobacco: Former Systems developer  . Alcohol use No     Comment: denies     Allergies   Patient has no known allergies.   Review of Systems Review  of Systems  Constitutional: Negative for fever.  Musculoskeletal: Positive for arthralgias. Negative for joint swelling and myalgias.  Neurological: Negative for weakness and numbness.     Physical Exam Updated Vital Signs BP 111/96 (BP Location: Left Arm)   Pulse 85   Temp 98.3 F (36.8 C) (Oral)   Resp 18   Ht 5\' 2"  (1.575 m)   Wt 68.5 kg  SpO2 92%   BMI 27.62 kg/m   Physical Exam  Constitutional: She appears well-developed and well-nourished.  HENT:  Head: Atraumatic.  Neck: Normal range of motion.  Cardiovascular:  Pulses:      Radial pulses are 2+ on the right side, and 2+ on the left side.  Pulses equal bilaterally  Musculoskeletal: She exhibits tenderness.       Right shoulder: She exhibits decreased range of motion and bony tenderness. She exhibits no effusion, no crepitus and no deformity.       Right elbow: Normal.      Right forearm: Normal.  Clavicle nontender and without palpable deformity.  Neurological: She is alert. She has normal strength. She displays normal reflexes. No sensory deficit.  Skin: Skin is warm and dry.  Psychiatric: She has a normal mood and affect.     ED Treatments / Results  Labs (all labs ordered are listed, but only abnormal results are displayed) Labs Reviewed - No data to display  EKG  EKG Interpretation None       Radiology Dg Shoulder Right  Result Date: 11/16/2016 CLINICAL DATA:  Fall with pain to the right shoulder EXAM: RIGHT SHOULDER - 2+ VIEW COMPARISON:  08/25/2016 FINDINGS: No dislocation is evident. Suspected nondisplaced fracture involving the lateral aspect of the humeral head. Right lung apex is clear. Probable old right fourth fifth and sixth rib fractures. IMPRESSION: 1. Suspect nondisplaced fracture of the lateral aspect of the right humeral head. No dislocation. 2. Probable old right fourth fifth and sixth rib fractures. Electronically Signed   By: Donavan Foil M.D.   On: 11/16/2016 00:06     Procedures Procedures (including critical care time)  Medications Ordered in ED Medications - No data to display   Initial Impression / Assessment and Plan / ED Course  I have reviewed the triage vital signs and the nursing notes.  Pertinent labs & imaging results that were available during my care of the patient were reviewed by me and considered in my medical decision making (see chart for details).  Clinical Course     Rest, ice,  Sling provided.  Oxycodone.  Pt advised f/u with Dr Sharol Given.  She will call in the am to advise of this new injury.    Final Clinical Impressions(s) / ED Diagnoses   Final diagnoses:  Closed fracture of head of right humerus, initial encounter    New Prescriptions New Prescriptions   OXYCODONE-ACETAMINOPHEN (PERCOCET/ROXICET) 5-325 MG TABLET    Take 1-2 tablets by mouth every 4 (four) hours as needed.     Evalee Jefferson, PA-C 11/16/16 [id number]    Rolland Porter, MD 11/16/16 [phone]

## 2016-11-16 NOTE — ED Provider Notes (Signed)
By signing my name below, I, Doran Stabler, attest that this documentation has been prepared under the direction and in the presence of Rolland Porter, MD. Electronically Signed: Doran Stabler, ED Scribe. 11/16/16. 12:56 AM.  HPI Comments: [name] is a 64 y.o. female who presents to the Emergency Department complaining of mechanical fall, prior to arrival. Upon interview, pt notes numbness in the tips of her right fingers.   Physical Exam Intact sensation to light touch in right fingers despite reports of numbness  Good ROM for right fingers. Good distal pulse. Wearing CAM walker on right extremity.   We reviewed her xray. She states she is already seeing Dr Sharol Given and has been in a Cam walker for about 3 weeks.   Medical screening examination/treatment/procedure(s) were conducted as a shared visit with non-physician practitioner(s) and myself.  I personally evaluated the patient during the encounter.    Rolland Porter, MD, Barbette Or, MD 11/16/16 [phone]

## 2016-11-16 NOTE — Discharge Instructions (Signed)
Wear your sling at all times to protect your shoulder joint.  You may take the oxycodone prescribed for pain relief.  This will make you drowsy - do not drive within 4 hours of taking this medication.

## 2016-11-17 ENCOUNTER — Ambulatory Visit (INDEPENDENT_AMBULATORY_CARE_PROVIDER_SITE_OTHER): Payer: Medicare Other | Admitting: Family

## 2016-11-17 VITALS — Ht 62.0 in | Wt 150.0 lb

## 2016-11-17 DIAGNOSIS — S42291A Other displaced fracture of upper end of right humerus, initial encounter for closed fracture: Secondary | ICD-10-CM

## 2016-11-17 MED ORDER — HYDROCODONE-ACETAMINOPHEN 5-325 MG PO TABS: 1.0000 | ORAL_TABLET | Freq: Four times a day (QID) | ORAL | 0 refills | Status: DC | PRN

## 2016-11-17 NOTE — Progress Notes (Signed)
Office Visit Note   Patient: [name]           Date of Birth: 06-Dec-1951           MRN: [id number] Visit Date: 11/17/2016              Requested by: Celene Squibb, MD [address] [place], [place] [address] PCP: Wende Neighbors, MD   Assessment & Plan: Visit Diagnoses:  1. Humeral head fracture, right, closed, initial encounter     Plan: Continue the sling. We'll remove this several times daily to work on pendulum swinging and walking her arm up the wall. Provided another prescription for pain medicine. She'll follow up in 2 more weeks.  Follow-Up Instructions: Return in about 2 weeks (around 12/01/2016).   Orders:  No orders of the defined types were placed in this encounter.  Meds ordered this encounter  Medications  . HYDROcodone-acetaminophen (NORCO/VICODIN) 5-325 MG tablet    Sig: Take 1 tablet by mouth every 6 (six) hours as needed for moderate pain.    Dispense:  45 tablet    Refill:  0      Procedures: No procedures performed   Clinical Data: No additional findings.   Subjective: Chief Complaint  Patient presents with  . Right Shoulder - Pain    DOI 11/15/16 "[address] and hurt my shoulder night before last"    Patient is a 65 year old woman seen today in follow-up from an ED visit. She fell. States her fracture boot which she is wearing on the right lower extremity tripped her up. Mechanical fall landing on the right shoulder. Sustained a nondisplaced fracture to the humeral head. Is in a sling today.    Review of Systems  Constitutional: Negative for chills and fever.  Musculoskeletal: Positive for arthralgias.  Skin: Negative for wound.     Objective: Vital Signs: Ht 5\' 2"  (1.575 m)   Wt 150 lb (68 kg)   BMI 27.44 kg/m   Physical Exam  Constitutional: She is oriented to person, place, and time. She appears well-developed and well-nourished.  Pulmonary/Chest: Effort normal.  Musculoskeletal:       Right upper arm: She exhibits  tenderness, bony tenderness and swelling. She exhibits no deformity.  Fracture boot right foot.   Neurological: She is alert and oriented to person, place, and time.  Psychiatric: She has a normal mood and affect.  Nursing note reviewed.   Ortho Exam  Specialty Comments:  No specialty comments available.  Imaging: No results found. Outside images were independently reviewed by me. Does have nondisplaced fracture to the right humeral head.  PMFS History: Patient Active Problem List   Diagnosis Date Noted  . Humeral head fracture, right, closed, initial encounter 11/17/2016  . Acute respiratory failure with hypercapnia ([facility]) 06/16/2015  . Acute respiratory acidosis 06/16/2015  . COPD (chronic obstructive pulmonary disease) ([facility]) 06/16/2015  . Narcotic overdose 06/15/2015  . Tobacco dependence 05/28/2015  . Acute respiratory failure with hypoxia ([facility]) 05/26/2015  . CAP (community acquired pneumonia) 05/25/2015  . Generalized anxiety disorder 01/22/2014   Past Medical History:  Diagnosis Date  . Anxiety    takes Xanax daily  . Brain tumor (benign) ([facility])   . Chronic back pain    bulding disc  . Chronic pain   . COPD (chronic obstructive pulmonary disease) ([facility])   . Degenerative disc disease   . Depression    takes Effexor daily  . Disc degeneration   . GERD (gastroesophageal reflux  disease)    takes Omeprazole daily  . Headache   . Hepatitis    Hep C  . History of bronchitis    uses inhaler daily  . History of colon polyps   . History of staph infection   . Hyperlipemia    takes Pravastatin daily  . Hypertension    takes Lotrel daily  . Joint pain   . Narcotic overdose 06/13/2015  . Numbness in both hands   . Osteoporosis    Fosamax weekly  . Pinched nerve in shoulder   . Pneumonia 2012  . Substance abuse    benzos, opiates "from non-prescribed sources"    Family History  Problem Relation Age of Onset  . Bipolar disorder Daughter   . Drug abuse Son   .  Drug abuse Grandchild   . Drug abuse Grandchild   . Diabetes Sister   . Cancer Sister     Past Surgical History:  Procedure Laterality Date  . ANKLE FRACTURE SURGERY Bilateral early 2000's  . COLONOSCOPY    . FACIAL FRACTURE SURGERY     in the late 90's  . KNEE SURGERY Right   . ORIF CALCANEOUS FRACTURE Right 07/13/2013   Procedure: RIGHT OPEN REDUCTION INTERNAL FIXATION (ORIF) CALCANEUS FRACTURE;  Surgeon: Newt Minion, MD;  Location: [facility];  Service: Orthopedics;  Laterality: Right;  . TUBAL LIGATION     Social History   Occupational History  . Not on file.   Social History Main Topics  . Smoking status: Current Every Day Smoker    Packs/day: 1.00    Years: 40.00    Types: Cigarettes  . Smokeless tobacco: Former Systems developer  . Alcohol use No     Comment: denies  . Drug use: No  . Sexual activity: Yes    Birth control/ protection: Post-menopausal

## 2016-11-29 ENCOUNTER — Ambulatory Visit (INDEPENDENT_AMBULATORY_CARE_PROVIDER_SITE_OTHER): Payer: Medicare Other | Admitting: Family

## 2016-11-29 ENCOUNTER — Ambulatory Visit (INDEPENDENT_AMBULATORY_CARE_PROVIDER_SITE_OTHER): Payer: Medicare Other

## 2016-11-29 DIAGNOSIS — S42291A Other displaced fracture of upper end of right humerus, initial encounter for closed fracture: Secondary | ICD-10-CM

## 2016-11-29 DIAGNOSIS — M79671 Pain in right foot: Secondary | ICD-10-CM | POA: Diagnosis not present

## 2016-11-29 DIAGNOSIS — M25511 Pain in right shoulder: Secondary | ICD-10-CM

## 2016-11-29 DIAGNOSIS — S92354D Nondisplaced fracture of fifth metatarsal bone, right foot, subsequent encounter for fracture with routine healing: Secondary | ICD-10-CM | POA: Diagnosis not present

## 2016-11-29 DIAGNOSIS — S92353D Displaced fracture of fifth metatarsal bone, unspecified foot, subsequent encounter for fracture with routine healing: Secondary | ICD-10-CM | POA: Insufficient documentation

## 2016-11-29 MED ORDER — OXYCODONE-ACETAMINOPHEN 5-325 MG PO TABS: 1.0000 | ORAL_TABLET | Freq: Three times a day (TID) | ORAL | 0 refills | Status: DC | PRN

## 2016-11-29 NOTE — Progress Notes (Signed)
Office Visit Note   Patient: [name]           Date of Birth: 1951/02/23           MRN: [id number] Visit Date: 11/29/2016              Requested by: Celene Squibb, MD [address] [place], [place] [address] PCP: Wende Neighbors, MD   Assessment & Plan: Visit Diagnoses:  1. Closed nondisplaced fracture of fifth metatarsal bone of right foot with routine healing, subsequent encounter   2. Humeral head fracture, right, closed, initial encounter   3. Acute pain of right shoulder   4. Pain in right foot     Plan: Continue cam walker. Have encouraged her to continue working on pendulum swings as well as walking her right arm up the wall. May continue the sling for 2 more weeks. She'll follow up in 2 weeks with repeat radiographs of right foot as well as right shoulder.  Follow-Up Instructions: Return in about 2 weeks (around 12/13/2016).   Orders:  Orders Placed This Encounter  Procedures  . XR Humerus Right  . XR Foot Complete Right   Meds ordered this encounter  Medications  . oxyCODONE-acetaminophen (PERCOCET/ROXICET) 5-325 MG tablet    Sig: Take 1 tablet by mouth every 8 (eight) hours as needed.    Dispense:  45 tablet    Refill:  0      Procedures: No procedures performed   Clinical Data: No additional findings.   Subjective: Chief Complaint  Patient presents with  . Right Shoulder - Follow-up    DOI 11/15/16 s/p fall nondisplaced fracture to the humeral head.     Pt is a 65 year old woman who is 2 weeks s/p a right nondisplaced fracture to the humeral head. She is in a sling. States that she does HEP with pendulum exercises and this causes her pain. She states that she was given an rx for Vicodin 5/325 mg but this makes her sick and she has stopped taking it and is using excederine.  His also 4 weeks status post a Jones fracture to the right foot. Has been full weightbearing in a fracture boot. no complaints of pain with ambulation. minimal  swelling.    Review of Systems  Constitutional: Negative for chills and fever.     Objective: Vital Signs: There were no vitals taken for this visit.  Physical Exam  Constitutional: She is oriented to person, place, and time. She appears well-developed and well-nourished.  Pulmonary/Chest: Effort normal.  Musculoskeletal:       Right foot: There is bony tenderness. There is no swelling and normal capillary refill.  Over the base of the fifth metatarsal.  Neurological: She is alert and oriented to person, place, and time.  Psychiatric: She has a normal mood and affect.  Nursing note reviewed.   Right Shoulder Exam   Tenderness  Right shoulder tenderness location: proximal humerus.  Other  Pulse: present      Specialty Comments:  No specialty comments available.  Imaging: Xr Foot Complete Right  Result Date: 11/29/2016 Three views of the right foot show early callus formation the fracture site. Jones fracture seen base of fifth metataral.  Xr Humerus Right  Result Date: 11/29/2016 Radiographs of the right humerus show the nondisplaced proximal humerus fracture. No complicating features.     PMFS History: Patient Active Problem List   Diagnosis Date Noted  . Nondisplaced fracture of fifth right metatarsal bone with  routine healing 11/29/2016  . Humeral head fracture, right, closed, initial encounter 11/17/2016  . Acute respiratory failure with hypercapnia ([facility]) 06/16/2015  . Acute respiratory acidosis 06/16/2015  . COPD (chronic obstructive pulmonary disease) ([facility]) 06/16/2015  . Narcotic overdose 06/15/2015  . Tobacco dependence 05/28/2015  . Acute respiratory failure with hypoxia ([facility]) 05/26/2015  . CAP (community acquired pneumonia) 05/25/2015  . Generalized anxiety disorder 01/22/2014   Past Medical History:  Diagnosis Date  . Anxiety    takes Xanax daily  . Brain tumor (benign) ([facility])   . Chronic back pain    bulding disc  . Chronic pain   .  COPD (chronic obstructive pulmonary disease) ([facility])   . Degenerative disc disease   . Depression    takes Effexor daily  . Disc degeneration   . GERD (gastroesophageal reflux disease)    takes Omeprazole daily  . Headache   . Hepatitis    Hep C  . History of bronchitis    uses inhaler daily  . History of colon polyps   . History of staph infection   . Hyperlipemia    takes Pravastatin daily  . Hypertension    takes Lotrel daily  . Joint pain   . Narcotic overdose 06/13/2015  . Numbness in both hands   . Osteoporosis    Fosamax weekly  . Pinched nerve in shoulder   . Pneumonia 2012  . Substance abuse    benzos, opiates "from non-prescribed sources"    Family History  Problem Relation Age of Onset  . Bipolar disorder Daughter   . Drug abuse Son   . Drug abuse Grandchild   . Drug abuse Grandchild   . Diabetes Sister   . Cancer Sister     Past Surgical History:  Procedure Laterality Date  . ANKLE FRACTURE SURGERY Bilateral early 2000's  . COLONOSCOPY    . FACIAL FRACTURE SURGERY     in the late 90's  . KNEE SURGERY Right   . ORIF CALCANEOUS FRACTURE Right 07/13/2013   Procedure: RIGHT OPEN REDUCTION INTERNAL FIXATION (ORIF) CALCANEUS FRACTURE;  Surgeon: Newt Minion, MD;  Location: [facility];  Service: Orthopedics;  Laterality: Right;  . TUBAL LIGATION     Social History   Occupational History  . Not on file.   Social History Main Topics  . Smoking status: Current Every Day Smoker    Packs/day: 1.00    Years: 40.00    Types: Cigarettes  . Smokeless tobacco: Former Systems developer  . Alcohol use No     Comment: denies  . Drug use: No  . Sexual activity: Yes    Birth control/ protection: Post-menopausal

## 2016-12-02 ENCOUNTER — Other Ambulatory Visit (HOSPITAL_COMMUNITY): Payer: Self-pay | Admitting: Internal Medicine

## 2016-12-02 DIAGNOSIS — Z1231 Encounter for screening mammogram for malignant neoplasm of breast: Secondary | ICD-10-CM

## 2016-12-09 ENCOUNTER — Ambulatory Visit (HOSPITAL_COMMUNITY): Payer: Self-pay

## 2016-12-16 ENCOUNTER — Ambulatory Visit (INDEPENDENT_AMBULATORY_CARE_PROVIDER_SITE_OTHER): Payer: Medicare Other

## 2016-12-16 ENCOUNTER — Ambulatory Visit (INDEPENDENT_AMBULATORY_CARE_PROVIDER_SITE_OTHER): Payer: Medicare Other | Admitting: Orthopedic Surgery

## 2016-12-16 VITALS — Ht 62.0 in | Wt 150.0 lb

## 2016-12-16 DIAGNOSIS — M25511 Pain in right shoulder: Secondary | ICD-10-CM | POA: Diagnosis not present

## 2016-12-16 DIAGNOSIS — S92351D Displaced fracture of fifth metatarsal bone, right foot, subsequent encounter for fracture with routine healing: Secondary | ICD-10-CM | POA: Diagnosis not present

## 2016-12-16 DIAGNOSIS — M79671 Pain in right foot: Secondary | ICD-10-CM

## 2016-12-16 MED ORDER — OXYCODONE-ACETAMINOPHEN 10-325 MG PO TABS: 1.0000 | ORAL_TABLET | Freq: Three times a day (TID) | ORAL | 0 refills | Status: DC | PRN

## 2016-12-16 MED ORDER — METHYLPREDNISOLONE ACETATE 40 MG/ML IJ SUSP
40.0000 mg | INTRAMUSCULAR | Status: AC | PRN
  Administered 2016-12-16: 40 mg via INTRA_ARTICULAR

## 2016-12-16 MED ORDER — LIDOCAINE HCL 1 % IJ SOLN
5.0000 mL | INTRAMUSCULAR | Status: AC | PRN
  Administered 2016-12-16: 5 mL

## 2016-12-16 NOTE — Progress Notes (Signed)
Office Visit Note   Patient: [name]           Date of Birth: [birth year]           MRN: [id number] Visit Date: 12/16/2016              Requested by: Celene Squibb, MD [address] [place], [place] [address] PCP: Wende Neighbors, MD   Assessment & Plan: Visit Diagnoses:  1. Fracture of base of fifth metatarsal bone of right foot with routine healing, subsequent encounter   2. Acute pain of right shoulder   3. Pain in right foot     Plan: Plan to continue wearing a fracture boot for 3 weeks. Subacromial injection for the right shoulder from the posterior approach and begin range of motion exercises.  Follow-up in 3 weeks with repeat 3 view radiographs of the right foot anticipate getting her out of the fracture boot at that time. Prescription provided for Percocet 10 mg tablets for pain. Patient states that she gets sick with Vicodin and that the 5 mg Percocet is not strong enough for her.  Follow-Up Instructions: Return in about 3 weeks (around 01/06/2017).   Orders:  Orders Placed This Encounter  Procedures  . XR Foot Complete Right  . XR Shoulder Right   Meds ordered this encounter  Medications  . oxyCODONE-acetaminophen (PERCOCET) 10-325 MG tablet    Sig: Take 1 tablet by mouth every 8 (eight) hours as needed for pain.    Dispense:  30 tablet    Refill:  0      Procedures: Large Joint Inj Date/Time: 12/16/2016 2:53 PM Performed by: Zarianna Dicarlo V Authorized by: Newt Minion   Consent Given by:  Patient Site marked: the procedure site was marked   Timeout: prior to procedure the correct patient, procedure, and site was verified   Indications:  Pain and diagnostic evaluation Location:  Shoulder Site:  R subacromial bursa Prep: patient was prepped and draped in usual sterile fashion   Needle Size:  22 G Needle Length:  1.5 inches Approach:  Posterior Ultrasound Guidance: No   Fluoroscopic Guidance: No   Arthrogram: No   Medications:  5 mL lidocaine 1  %; 40 mg methylPREDNISolone acetate 40 MG/ML Aspiration Attempted: No   Patient tolerance:  Patient tolerated the procedure well with no immediate complications     Clinical Data: No additional findings.   Subjective: Chief Complaint  Patient presents with  . Right Foot - Follow-up    Closed nondisplaced fracture of fifth metatarsal bone of right foot      . Right Shoulder - Follow-up    Humeral head fracture, right      Pt is in a sling on the right and a fracture boot weight bearing on the right. The pt states that she is having throbbing of the shoulder and foot. 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 All of her children are older and she does state that they can take care of themselves. Patient states that she was taking Percocet originally had been given a 10 mg rx and that the second refill was only for 5 mg. Patient is upset that the PA would not refill the 10 mg and that she has had to take it more than it was written for and that she wants another refill today.     Review of Systems  patient denies any traumatic fall or injury to the right shoulder.   Objective: Vital Signs: Ht 5\' 2"  (1.575 m)   Wt 150 lb (68 kg)   BMI 27.44 kg/m   Physical Exam on examination patient is alert oriented no adenopathy well-dressed normal affect normal respiratory effort she is in mild distress from her right shoulder pain currently in a sling. Examination patient has some mild crepitation with range of motion of the shoulder there is no ecchymosis no bruising she has pain with Neer and Hawkins impingement test pain with drop arm test. Examination of right foot patient is minimally symptomatic. Radiographs shows good healing callus at the fracture site.  Ortho Exam  Specialty Comments:  No specialty comments available.  Imaging: Xr Foot Complete Right  Result Date: 12/16/2016 Three-view radiographs of the right foot  shows good callus formation at the fracture site with interval healing.  Xr Shoulder Right  Result Date: 12/16/2016 Two-view radiographs of the right shoulder show superior migration of the humeral head within the glenoid consistent with chronic rotator cuff pathology. She does have arthropathy of the before meals joint and a hooked type III acromion. Lung field is clear.    PMFS History: Patient Active Problem List   Diagnosis Date Noted  . Acute pain of right shoulder 12/16/2016  . Pain in right foot 12/16/2016  . Fracture of base of fifth metatarsal bone with routine healing 11/29/2016  . Humeral head fracture, right, closed, initial encounter 11/17/2016  . Acute respiratory failure with hypercapnia ([facility]) 06/16/2015  . Acute respiratory acidosis 06/16/2015  . COPD (chronic obstructive pulmonary disease) ([facility]) 06/16/2015  . Narcotic overdose 06/15/2015  . Tobacco dependence 05/28/2015  . Acute respiratory failure with hypoxia ([facility]) 05/26/2015  . CAP (community acquired pneumonia) 05/25/2015  . Generalized anxiety disorder 01/22/2014   Past Medical History:  Diagnosis Date  . Anxiety    takes Xanax daily  . Brain tumor (benign) ([facility])   . Chronic back pain    bulding disc  . Chronic pain   . COPD (chronic obstructive pulmonary disease) ([facility])   . Degenerative disc disease   . Depression    takes Effexor daily  . Disc degeneration   . GERD (gastroesophageal reflux disease)    takes Omeprazole daily  . Headache   . Hepatitis    Hep C  . History of bronchitis    uses inhaler daily  . History of colon polyps   . History of staph infection   . Hyperlipemia    takes Pravastatin daily  . Hypertension    takes Lotrel daily  . Joint pain   . Narcotic overdose 06/13/2015  . Numbness in both hands   . Osteoporosis    Fosamax weekly  . Pinched nerve in shoulder   . Pneumonia 2012  . Substance abuse    benzos, opiates "from non-prescribed sources"    Family History    Problem Relation Age of Onset  . Bipolar disorder Daughter   . Drug abuse Son   . Drug abuse Grandchild   . Drug abuse Grandchild   . Diabetes Sister   . Cancer Sister     Past Surgical History:  Procedure Laterality Date  . ANKLE FRACTURE SURGERY Bilateral early 2000's  . COLONOSCOPY    . FACIAL FRACTURE SURGERY     in the late 90's  . KNEE SURGERY Right   . ORIF CALCANEOUS FRACTURE Right 07/13/2013   Procedure: RIGHT OPEN REDUCTION INTERNAL FIXATION (ORIF) CALCANEUS  FRACTURE;  Surgeon: Newt Minion, MD;  Location: [facility];  Service: Orthopedics;  Laterality: Right;  . TUBAL LIGATION     Social History   Occupational History  . Not on file.   Social History Main Topics  . Smoking status: Current Every Day Smoker    Packs/day: 1.00    Years: 40.00    Types: Cigarettes  . Smokeless tobacco: Former Systems developer  . Alcohol use No     Comment: denies  . Drug use: No  . Sexual activity: Yes    Birth control/ protection: Post-menopausal

## 2016-12-29 ENCOUNTER — Telehealth: Payer: Self-pay

## 2016-12-29 NOTE — Telephone Encounter (Signed)
Pt was referred for colonoscopy by Dr. Maudie Mercury. She received her letter from [place] and called to schedule. She said her last one was approximately 3 years ago at [facility]. She previously had polyps, but she did not that time. I told her I would check on that report and then give her a call back when I receive the report.

## 2016-12-30 NOTE — Telephone Encounter (Signed)
I received the report back on pt's last colonoscopy at [facility]. It was done on 03/11/2011 by Dr. Sherie Don.  Impression: Normal colonoscopy Recommendations: Follow up with PCP PRN and have yearly hemoccult.  Pt states she is not having any GI problems and no family hx of colon cancer.   Please advise!

## 2016-12-31 ENCOUNTER — Ambulatory Visit (HOSPITAL_COMMUNITY)
Admission: RE | Admit: 2016-12-31 | Discharge: 2016-12-31 | Disposition: A | Discharge status: Home / Self Care | Payer: Medicare Other | Source: Ambulatory Visit | Attending: Internal Medicine | Admitting: Internal Medicine

## 2016-12-31 DIAGNOSIS — Z1231 Encounter for screening mammogram for malignant neoplasm of breast: Secondary | ICD-10-CM | POA: Insufficient documentation

## 2016-12-31 NOTE — Telephone Encounter (Signed)
Due for surveillance colonoscopy for prior h/o colon polyps

## 2017-01-03 NOTE — Telephone Encounter (Signed)
LMOM to call.

## 2017-01-07 ENCOUNTER — Ambulatory Visit (INDEPENDENT_AMBULATORY_CARE_PROVIDER_SITE_OTHER): Payer: Medicare Other | Admitting: Orthopedic Surgery

## 2017-01-11 ENCOUNTER — Ambulatory Visit (INDEPENDENT_AMBULATORY_CARE_PROVIDER_SITE_OTHER): Payer: Medicare Other | Admitting: Orthopedic Surgery

## 2017-01-11 ENCOUNTER — Encounter (INDEPENDENT_AMBULATORY_CARE_PROVIDER_SITE_OTHER): Payer: Self-pay | Admitting: Orthopedic Surgery

## 2017-01-11 ENCOUNTER — Ambulatory Visit (INDEPENDENT_AMBULATORY_CARE_PROVIDER_SITE_OTHER): Payer: Medicare Other

## 2017-01-11 VITALS — Ht 62.0 in | Wt 150.0 lb

## 2017-01-11 DIAGNOSIS — M25511 Pain in right shoulder: Secondary | ICD-10-CM

## 2017-01-11 DIAGNOSIS — M79671 Pain in right foot: Secondary | ICD-10-CM

## 2017-01-11 DIAGNOSIS — M84374S Stress fracture, right foot, sequela: Secondary | ICD-10-CM

## 2017-01-11 MED ORDER — OXYCODONE-ACETAMINOPHEN 5-325 MG PO TABS: 1.0000 | ORAL_TABLET | Freq: Four times a day (QID) | ORAL | 0 refills | Status: DC | PRN

## 2017-01-11 NOTE — Progress Notes (Signed)
Office Visit Note   Patient: [name]           Date of Birth: [birth year]           MRN: [id number] Visit Date: 01/11/2017              Requested by: Celene Squibb, MD [address] [place],  [address] PCP: Wende Neighbors, MD  Chief Complaint  Patient presents with  . Right Foot - Pain    Follow up fracture base of the 5th MT  . Right Shoulder - Pain    S/p injection 12/16/16    HPI: Patient is full weight bearing in her fracture boot on the right foot. She is in today for follow up of a 5th MT fracture and states that this is feeling fine. She does note that she has continued right shoulder pain. She states that she is having pain and that it is worse than it was prior to her injection back in December with throbbing pain. Autumn L Forrest, RMA  Patient is still smoking the importance of smoking cessation was discussed.  Assessment & Plan: Visit Diagnoses:  1. Pain in right foot   2. Acute pain of right shoulder   3. Fracture, stress, metatarsal, right, sequela     Plan: She will stop smoking recommended trying an patches. Continue the fracture boot on the right repeat 3 view radiographs of the right foot at follow-up in 4 weeks. Start range of motion of the right shoulder currently in a sling.  Follow-Up Instructions: No Follow-up on file.   Ortho Exam Examination patient is alert oriented no adenopathy well-dressed normal affect normal respiratory effort she does have an antalgic gait. She is still tender to palpation of the base of fifth metatarsal right foot. She has good pulses. Patient has pain with range of motion of the right shoulder.  Imaging: Xr Foot Complete Right  Result Date: 01/11/2017 Three-view radiographs the right foot shows slow delayed healing of the fracture fifth metatarsal metaphyseal diaphyseal fracture.   Orders:  Orders Placed This Encounter  Procedures  . XR Foot Complete Right   Meds ordered this encounter  Medications  .  oxyCODONE-acetaminophen (PERCOCET/ROXICET) 5-325 MG tablet    Sig: Take 1 tablet by mouth every 6 (six) hours as needed for severe pain.    Dispense:  30 tablet    Refill:  0     Procedures: No procedures performed  Clinical Data: No additional findings.  Subjective: Review of Systems  Objective: Vital Signs: Ht 5\' 2"  (1.575 m)   Wt 150 lb (68 kg)   BMI 27.44 kg/m   Specialty Comments:  No specialty comments available.  PMFS History: Patient Active Problem List   Diagnosis Date Noted  . Fracture, stress, metatarsal, right, sequela 01/11/2017  . Acute pain of right shoulder 12/16/2016  . Pain in right foot 12/16/2016  . Fracture of base of fifth metatarsal bone with routine healing 11/29/2016  . Humeral head fracture, right, closed, initial encounter 11/17/2016  . Acute respiratory failure with hypercapnia ([place]) 06/16/2015  . Acute respiratory acidosis 06/16/2015  . COPD (chronic obstructive pulmonary disease) ([place]) 06/16/2015  . Narcotic overdose 06/15/2015  . Tobacco dependence 05/28/2015  . Acute respiratory failure with hypoxia ([place]) 05/26/2015  . CAP (community acquired pneumonia) 05/25/2015  . Generalized anxiety disorder 01/22/2014   Past Medical History:  Diagnosis Date  . Anxiety    takes Xanax daily  . Brain tumor (benign) ([place])   .  Chronic back pain    bulding disc  . Chronic pain   . COPD (chronic obstructive pulmonary disease) ([place])   . Degenerative disc disease   . Depression    takes Effexor daily  . Disc degeneration   . GERD (gastroesophageal reflux disease)    takes Omeprazole daily  . Headache   . Hepatitis    Hep C  . History of bronchitis    uses inhaler daily  . History of colon polyps   . History of staph infection   . Hyperlipemia    takes Pravastatin daily  . Hypertension    takes Lotrel daily  . Joint pain   . Narcotic overdose 06/13/2015  . Numbness in both hands   . Osteoporosis    Fosamax weekly  . Pinched nerve in  shoulder   . Pneumonia 2012  . Substance abuse    benzos, opiates "from non-prescribed sources"    Family History  Problem Relation Age of Onset  . Bipolar disorder Daughter   . Drug abuse Son   . Drug abuse Grandchild   . Drug abuse Grandchild   . Diabetes Sister   . Cancer Sister     Past Surgical History:  Procedure Laterality Date  . ANKLE FRACTURE SURGERY Bilateral early 2000's  . COLONOSCOPY    . FACIAL FRACTURE SURGERY     in the late 90's  . KNEE SURGERY Right   . ORIF CALCANEOUS FRACTURE Right 07/13/2013   Procedure: RIGHT OPEN REDUCTION INTERNAL FIXATION (ORIF) CALCANEUS FRACTURE;  Surgeon: Newt Minion, MD;  Location: [facility];  Service: Orthopedics;  Laterality: Right;  . TUBAL LIGATION     Social History   Occupational History  . Not on file.   Social History Main Topics  . Smoking status: Current Every Day Smoker    Packs/day: 1.00    Years: 40.00    Types: Cigarettes  . Smokeless tobacco: Former Systems developer  . Alcohol use No     Comment: denies  . Drug use: No  . Sexual activity: Yes    Birth control/ protection: Post-menopausal

## 2017-01-11 NOTE — Telephone Encounter (Signed)
Pt has a broken R. shoulder and a broken R foot at this time. She is headed to the doctor today to see how they are doing. She said that she would be able to lay on her left side for the colonoscopy. She will call back tomorrow to let me know what the doctor says.

## 2017-01-14 ENCOUNTER — Emergency Department (HOSPITAL_COMMUNITY)
Admission: EM | Admit: 2017-01-14 | Discharge: 2017-01-14 | Disposition: A | Discharge status: Home / Self Care | Payer: Medicare Other | Attending: Emergency Medicine | Admitting: Emergency Medicine

## 2017-01-14 ENCOUNTER — Encounter (HOSPITAL_COMMUNITY): Payer: Self-pay | Admitting: *Deleted

## 2017-01-14 DIAGNOSIS — J988 Other specified respiratory disorders: Secondary | ICD-10-CM

## 2017-01-14 DIAGNOSIS — Z7982 Long term (current) use of aspirin: Secondary | ICD-10-CM | POA: Insufficient documentation

## 2017-01-14 DIAGNOSIS — Z79899 Other long term (current) drug therapy: Secondary | ICD-10-CM | POA: Insufficient documentation

## 2017-01-14 DIAGNOSIS — J449 Chronic obstructive pulmonary disease, unspecified: Secondary | ICD-10-CM | POA: Diagnosis not present

## 2017-01-14 DIAGNOSIS — F17210 Nicotine dependence, cigarettes, uncomplicated: Secondary | ICD-10-CM | POA: Diagnosis not present

## 2017-01-14 DIAGNOSIS — R69 Illness, unspecified: Secondary | ICD-10-CM

## 2017-01-14 DIAGNOSIS — J111 Influenza due to unidentified influenza virus with other respiratory manifestations: Secondary | ICD-10-CM | POA: Diagnosis not present

## 2017-01-14 DIAGNOSIS — R05 Cough: Secondary | ICD-10-CM | POA: Diagnosis present

## 2017-01-14 DIAGNOSIS — I10 Essential (primary) hypertension: Secondary | ICD-10-CM | POA: Diagnosis not present

## 2017-01-14 DIAGNOSIS — B9789 Other viral agents as the cause of diseases classified elsewhere: Secondary | ICD-10-CM

## 2017-01-14 MED ORDER — ACETAMINOPHEN 325 MG PO TABS
650.0000 mg | ORAL_TABLET | Freq: Once | ORAL | Status: AC
  Administered 2017-01-14: 650 mg via ORAL
  Filled 2017-01-14: qty 2

## 2017-01-14 NOTE — ED Triage Notes (Signed)
Pt reports coughing since yesterday and sore throat on the right side that began this morning.  Hx of bronchitis and COPD.  No fevers/chills/vomiting/nausea at this time.

## 2017-01-14 NOTE — Discharge Instructions (Signed)
Take Tylenol for aches. Use your albuterol inhaler 2 puffs every 4 hours as needed for cough or shortness of breath. Return if needed more than every 4 hours or if your condition worsens for any reason or see Dr. Nevada Crane in his office.make sure the drink at least six 8 ounce glasses of water daily in order to stay well hydrated. Ask Dr. Nevada Crane to help you to stop smoking

## 2017-01-14 NOTE — ED Provider Notes (Signed)
[facility] DEPT Provider Note   CSN: [id number] Arrival date & time: 01/14/17  1313   By signing my name below, I, Soijett Blue, attest that this documentation has been prepared under the direction and in the presence of Orlie Dakin, MD. Electronically Signed: Soijett Blue, ED Scribe. 01/14/17. 1:51 PM.  History   Chief Complaint Chief Complaint  Patient presents with  . Cough  . Sore Throat    HPI [name] is a 66 y.o. female with a PMHx of bronchitis, COPD, HTN, who presents to the Emergency Department complaining of productive cough x yellow sputum onset yesterday morning. Pt reports associated r sore throat x this morning and generalized body acheswith diffuse myalgias. No treatment prior to coming here. She did not use her Spiriva inhaler this morning.Pt notes that she did obtain a flu vaccination this past year. She denies fever, chills, SOB, and any other symptoms. Pt notes that she does smoke cigarettes. Pt denies illegal drug use or ETOH use. Pt PCP is Dr. Wende Neighbors.     The history is provided by the patient. No language interpreter was used.    Past Medical History:  Diagnosis Date  . Anxiety    takes Xanax daily  . Brain tumor (benign) ([facility] [facility])   . Chronic back pain    bulding disc  . Chronic pain   . COPD (chronic obstructive pulmonary disease) ([place])   . Degenerative disc disease   . Depression    takes Effexor daily  . Disc degeneration   . GERD (gastroesophageal reflux disease)    takes Omeprazole daily  . Headache   . Hepatitis    Hep C  . History of bronchitis    uses inhaler daily  . History of colon polyps   . History of staph infection   . Hyperlipemia    takes Pravastatin daily  . Hypertension    takes Lotrel daily  . Joint pain   . Narcotic overdose 06/13/2015  . Numbness in both hands   . Osteoporosis    Fosamax weekly  . Pinched nerve in shoulder   . Pneumonia 2012  . Substance abuse    benzos, opiates "from non-prescribed  sources"    Patient Active Problem List   Diagnosis Date Noted  . Fracture, stress, metatarsal, right, sequela 01/11/2017  . Acute pain of right shoulder 12/16/2016  . Pain in right foot 12/16/2016  . Fracture of base of fifth metatarsal bone with routine healing 11/29/2016  . Humeral head fracture, right, closed, initial encounter 11/17/2016  . Acute respiratory failure with hypercapnia ([place] [facility]) 06/16/2015  . Acute respiratory acidosis 06/16/2015  . COPD (chronic obstructive pulmonary disease) ([place] [facility]) 06/16/2015  . Narcotic overdose 06/15/2015  . Tobacco dependence 05/28/2015  . Acute respiratory failure with hypoxia ([place]) 05/26/2015  . CAP (community acquired pneumonia) 05/25/2015  . Generalized anxiety disorder 01/22/2014    Past Surgical History:  Procedure Laterality Date  . ANKLE FRACTURE SURGERY Bilateral early 2000's  . COLONOSCOPY    . FACIAL FRACTURE SURGERY     in the late 90's  . KNEE SURGERY Right   . ORIF CALCANEOUS FRACTURE Right 07/13/2013   Procedure: RIGHT OPEN REDUCTION INTERNAL FIXATION (ORIF) CALCANEUS FRACTURE;  Surgeon: Newt Minion, MD;  Location: [facility];  Service: Orthopedics;  Laterality: Right;  . TUBAL LIGATION      OB History    Gravida Para Term Preterm AB Living   3 2 2   1 2    SAB  TAB Ectopic Multiple Live Births   1               Home Medications    Prior to Admission medications   Medication Sig Start Date End Date Taking? Authorizing Provider  albuterol (PROVENTIL HFA;VENTOLIN HFA) 108 (90 BASE) MCG/ACT inhaler Inhale 2 puffs into the lungs every 6 (six) hours as needed for wheezing.    Historical Provider, MD  alendronate (FOSAMAX) 70 MG tablet Take 70 mg by mouth every 7 (seven) days. On mondays. Take with a full glass of water on an empty stomach.     Historical Provider, MD  alprazolam Duanne Moron) 2 MG tablet Take 1 tablet (2 mg total) by mouth 3 (three) times daily as needed for anxiety. 07/10/15   Cloria Spring, MD  amLODipine-benazepril  (LOTREL) 5-10 MG per capsule Take 1 capsule by mouth daily.    Historical Provider, MD  Aspirin-Acetaminophen-Caffeine (EXCEDRIN PO) Take 2 tablets by mouth 3 (three) times daily as needed (back and leg pain).    Historical Provider, MD  azithromycin (ZITHROMAX Z-PAK) 250 MG tablet 2 po day one, then 1 daily x 4 days 08/25/16   Milton Ferguson, MD  cyclobenzaprine (FLEXERIL) 5 MG tablet Take 5 mg by mouth 2 (two) times daily as needed. 05/07/16   Historical Provider, MD  dicyclomine (BENTYL) 20 MG tablet Take 1 tablet (20 mg total) by mouth every 6 (six) hours as needed for spasms. 05/07/16   Virgel Manifold, MD  gabapentin (NEURONTIN) 800 MG tablet Take 800 mg by mouth 4 (four) times daily.    Historical Provider, MD  Nyoka Cowden Tea, Camillia sinensis, (GREEN TEA PO) Take 1 tablet by mouth daily.    Historical Provider, MD  HYDROcodone-acetaminophen (NORCO/VICODIN) 5-325 MG tablet Take 1 tablet by mouth every 6 (six) hours as needed for moderate pain. 11/17/16   Suzan Slick, NP  omeprazole (PRILOSEC) 20 MG capsule Take 20 mg by mouth daily.    Historical Provider, MD  oxyCODONE-acetaminophen (PERCOCET) 10-325 MG tablet Take 1 tablet by mouth every 8 (eight) hours as needed for pain. 12/16/16   Newt Minion, MD  oxyCODONE-acetaminophen (PERCOCET/ROXICET) 5-325 MG tablet Take 1 tablet by mouth every 8 (eight) hours as needed. 11/29/16   Suzan Slick, NP  oxyCODONE-acetaminophen (PERCOCET/ROXICET) 5-325 MG tablet Take 1 tablet by mouth every 6 (six) hours as needed for severe pain. 01/11/17   Newt Minion, MD  pravastatin (PRAVACHOL) 20 MG tablet Take 20 mg by mouth daily.    Historical Provider, MD  predniSONE (DELTASONE) 10 MG tablet Take 2 tablets (20 mg total) by mouth daily. 08/25/16   Milton Ferguson, MD  tiotropium (SPIRIVA HANDIHALER) 18 MCG inhalation capsule Place 1 capsule (18 mcg total) into inhaler and inhale daily. Pharmacist please instruct in proper use. 05/29/15   Samuella Cota, MD  traZODone  (DESYREL) 150 MG tablet Take 150 mg by mouth at bedtime. 04/05/16   Historical Provider, MD  venlafaxine XR (EFFEXOR-XR) 150 MG 24 hr capsule Take 2 capsules (300 mg total) by mouth daily. 10/08/15   Cloria Spring, MD    Family History Family History  Problem Relation Age of Onset  . Bipolar disorder Daughter   . Drug abuse Son   . Drug abuse Grandchild   . Drug abuse Grandchild   . Diabetes Sister   . Cancer Sister     Social History Social History  Substance Use Topics  . Smoking status: Current Every Day Smoker  Packs/day: 0.50    Years: 40.00    Types: Cigarettes  . Smokeless tobacco: Former Systems developer  . Alcohol use No     Comment: denies     Allergies   Patient has no known allergies.   Review of Systems Review of Systems  Constitutional: Negative.   HENT: Positive for sore throat.   Respiratory: Positive for cough (productive).   Cardiovascular: Negative.   Gastrointestinal: Negative.   Musculoskeletal: Positive for myalgias.       Chronic right shoulder pain.  lower extremity and  Walking boot  Skin: Negative.   Neurological: Negative.   Psychiatric/Behavioral: Negative.   All other systems reviewed and are negative.    Physical Exam Updated Vital Signs BP 146/85 (BP Location: Left Arm)   Pulse 79   Temp 98.7 F (37.1 C) (Oral)   Resp 12   SpO2 92%   Physical Exam  Constitutional: She appears well-developed and well-nourished.  HENT:  Head: Normocephalic and atraumatic.  Left Ear: External ear normal.  Mouth/Throat: Oropharynx is clear and moist. No oropharyngeal exudate.  Oropharynx minimally reddened. Bilateral tympanic membranes normaluvula midline  Eyes: Conjunctivae are normal. Pupils are equal, round, and reactive to light.  Neck: Neck supple. No tracheal deviation present. No thyromegaly present.  Cardiovascular: Normal rate and regular rhythm.   No murmur heard. Pulmonary/Chest: Effort normal and breath sounds normal.  Abdominal: Soft.  Bowel sounds are normal. She exhibits no distension. There is no tenderness.  Musculoskeletal: Normal range of motion. She exhibits no edema or tenderness.  Lymphadenopathy:    She has no cervical adenopathy.  Neurological: She is alert. Coordination normal.  Skin: Skin is warm and dry. No rash noted.  Psychiatric: She has a normal mood and affect.  Nursing note and vitals reviewed.    ED Treatments / Results  DIAGNOSTIC STUDIES: Oxygen Saturation is 92% on RA, low by my interpretation.    COORDINATION OF CARE: 1:50 PM Discussed treatment plan with pt at bedside which includes tylenol and pt agreed to plan.  Procedures Procedures (including critical care time)  Medications Ordered in ED Medications - No data to display   Initial Impression / Assessment and Plan / ED Course  I have reviewed the triage vital signs and the nursing notes.   Patient denies any shortness of breath. Lungs clear to auscultation. I counseled patient for 5 minutes on smoking cessation. Tylenol for pain. Encourage oral hydration.use albuterol inhaler 2 puffs every 4 hours as needed for cough or shortness of breath. Use per Riva inhaler as directed Follow up with Dr. Nevada Crane if not better by next week. Patient asked for Percocet for her aches. [place] Controlled Substance reporting System queried. Patient was prescribed 30 Percocet tablets on 01/11/2017  Final Clinical Impressions(s) / ED Diagnoses  Diagnosis #1 influenza-like illness #2 tobacco abuse Final diagnoses:  None    New Prescriptions New Prescriptions   No medications on file   I personally performed the services described in this documentation, which was scribed in my presence. The recorded information has been reviewed and considered.     Orlie Dakin, MD 01/14/17 [phone]

## 2017-01-14 NOTE — ED Notes (Signed)
Pt left prior to receiving discharge instructions.

## 2017-01-17 NOTE — Telephone Encounter (Signed)
Letter mailed to pt to call when she is ready to schedule the colonoscopy.

## 2017-02-07 ENCOUNTER — Ambulatory Visit (INDEPENDENT_AMBULATORY_CARE_PROVIDER_SITE_OTHER): Payer: Medicare Other | Admitting: Orthopedic Surgery

## 2017-02-07 ENCOUNTER — Encounter (INDEPENDENT_AMBULATORY_CARE_PROVIDER_SITE_OTHER): Payer: Self-pay | Admitting: Orthopedic Surgery

## 2017-02-07 ENCOUNTER — Ambulatory Visit (INDEPENDENT_AMBULATORY_CARE_PROVIDER_SITE_OTHER): Payer: Medicare Other

## 2017-02-07 DIAGNOSIS — M79671 Pain in right foot: Secondary | ICD-10-CM

## 2017-02-07 MED ORDER — OXYCODONE-ACETAMINOPHEN 5-325 MG PO TABS: 1.0000 | ORAL_TABLET | Freq: Three times a day (TID) | ORAL | 0 refills | Status: DC | PRN

## 2017-02-07 NOTE — Progress Notes (Signed)
Office Visit Note   Patient: [name]           Date of Birth: Sep 01, 1951           MRN: [id number] Visit Date: 02/07/2017              Requested by: Celene Squibb, MD [address] [place], [place] [address] PCP: Wende Neighbors, MD  Chief Complaint  Patient presents with  . Right Foot - Fracture    fifth metatarsal metaphyseal diaphyseal fracture  . Right Shoulder - Pain    HPI: Patient is 66 y.o female who presents for follow up of right foot. She is status post fifth metatarsal metaphyseal diaphyseal fracture. She is full weightbearing with cam walker. She also complains of persistent right shoulder pain. She has dropped down to 4 cigarettes a day. Maxcine Ham, RT   Patient denies any pain with ambulation. She states she is down to 4 cigarettes a day. Planes of right shoulder pain which is unrelieved with anti-inflammatories. Assessment & Plan: Visit Diagnoses:  1. Pain in right foot     Plan: Patient will advance to regular shoewear follow-up the office in 4 weeks.  Obtain three-view radiographs of the right foot at follow-up.  Prescription for Percocet No. 20 for her shoulder symptoms.  Follow-Up Instructions: Return in about 4 weeks (around 03/07/2017).   Ortho Exam Examination patient is alert oriented no adenopathy well-dressed normal affect normal respiratory effort she does have an antalgic gait. Examination she has good pulses she has no tenderness to palpation over the base of the fifth metatarsal.  Imaging: Xr Foot Complete Right  Result Date: 02/07/2017 Three-view radiographs of the right foot shows good callus formation around the fracture site of the metaphyseal diaphyseal fifth metatarsal fracture.   Orders:  Orders Placed This Encounter  Procedures  . XR Foot Complete Right   Meds ordered this encounter  Medications  . oxyCODONE-acetaminophen (PERCOCET/ROXICET) 5-325 MG tablet    Sig: Take 1 tablet by mouth every 8 (eight) hours as  needed for severe pain.    Dispense:  20 tablet    Refill:  0     Procedures: No procedures performed  Clinical Data: No additional findings.  Subjective: Review of Systems  Objective: Vital Signs: There were no vitals taken for this visit.  Specialty Comments:  No specialty comments available.  PMFS History: Patient Active Problem List   Diagnosis Date Noted  . Fracture, stress, metatarsal, right, sequela 01/11/2017  . Acute pain of right shoulder 12/16/2016  . Pain in right foot 12/16/2016  . Fracture of base of fifth metatarsal bone with routine healing 11/29/2016  . Humeral head fracture, right, closed, initial encounter 11/17/2016  . Acute respiratory failure with hypercapnia ([facility]) 06/16/2015  . Acute respiratory acidosis 06/16/2015  . COPD (chronic obstructive pulmonary disease) ([facility]) 06/16/2015  . Narcotic overdose 06/15/2015  . Tobacco dependence 05/28/2015  . Acute respiratory failure with hypoxia ([place]) 05/26/2015  . CAP (community acquired pneumonia) 05/25/2015  . Generalized anxiety disorder 01/22/2014   Past Medical History:  Diagnosis Date  . Anxiety    takes Xanax daily  . Brain tumor (benign) ([facility] [facility])   . Chronic back pain    bulding disc  . Chronic pain   . COPD (chronic obstructive pulmonary disease) ([facility])   . Degenerative disc disease   . Depression    takes Effexor daily  . Disc degeneration   . GERD (gastroesophageal reflux disease)  takes Omeprazole daily  . Headache   . Hepatitis    Hep C  . History of bronchitis    uses inhaler daily  . History of colon polyps   . History of staph infection   . Hyperlipemia    takes Pravastatin daily  . Hypertension    takes Lotrel daily  . Joint pain   . Narcotic overdose 06/13/2015  . Numbness in both hands   . Osteoporosis    Fosamax weekly  . Pinched nerve in shoulder   . Pneumonia 2012  . Substance abuse    benzos, opiates "from non-prescribed sources"    Family History  Problem  Relation Age of Onset  . Bipolar disorder Daughter   . Drug abuse Son   . Drug abuse Grandchild   . Drug abuse Grandchild   . Diabetes Sister   . Cancer Sister     Past Surgical History:  Procedure Laterality Date  . ANKLE FRACTURE SURGERY Bilateral early 2000's  . COLONOSCOPY    . FACIAL FRACTURE SURGERY     in the late 90's  . KNEE SURGERY Right   . ORIF CALCANEOUS FRACTURE Right 07/13/2013   Procedure: RIGHT OPEN REDUCTION INTERNAL FIXATION (ORIF) CALCANEUS FRACTURE;  Surgeon: Newt Minion, MD;  Location: [facility];  Service: Orthopedics;  Laterality: Right;  . TUBAL LIGATION     Social History   Occupational History  . Not on file.   Social History Main Topics  . Smoking status: Current Every Day Smoker    Packs/day: 0.50    Years: 40.00    Types: Cigarettes  . Smokeless tobacco: Former Systems developer  . Alcohol use No     Comment: denies  . Drug use: No  . Sexual activity: Yes    Birth control/ protection: Post-menopausal

## 2017-02-21 ENCOUNTER — Telehealth: Payer: Self-pay

## 2017-02-21 NOTE — Telephone Encounter (Signed)
[name] SET UP TCS

## 2017-02-21 NOTE — Telephone Encounter (Signed)
Gastroenterology Pre-Procedure Review  Request Date: Requesting Physician:   PATIENT REVIEW QUESTIONS: The patient responded to the following health history questions as indicated:    1. Diabetes Melitis: NO 2. Joint replacements in the past 12 months: NO 3. Major health problems in the past 3 months: NO 4. Has an artificial valve or MVP: NO 5. Has a defibrillator: NO 6. Has been advised in past to take antibiotics in advance of a procedure like teeth cleaning: NO 7. Family history of colon cancer: NO 8. Alcohol Use: NO 9. History of sleep apnea: NO 10. History of coronary artery or other vascular stents placed within the last 12 months: NO    MEDICATIONS & ALLERGIES:    Patient reports the following regarding taking any blood thinners:   Plavix? NO Aspirin? NO Coumadin? NO Brilinta? NO Xarelto? NO Eliquis? NO Pradaxa? NO Savaysa? NO Effient? NO  Patient confirms/reports the following medications:  Current Outpatient Prescriptions  Medication Sig Dispense Refill  . albuterol (PROVENTIL HFA;VENTOLIN HFA) 108 (90 BASE) MCG/ACT inhaler Inhale 2 puffs into the lungs every 6 (six) hours as needed for wheezing.    Marland Kitchen alendronate (FOSAMAX) 70 MG tablet Take 70 mg by mouth every 7 (seven) days. On mondays. Take with a full glass of water on an empty stomach.     Marland Kitchen amLODipine-benazepril (LOTREL) 5-10 MG per capsule Take 1 capsule by mouth daily.    Marland Kitchen gabapentin (NEURONTIN) 800 MG tablet Take 800 mg by mouth 4 (four) times daily.    Marland Kitchen HYDROcodone-acetaminophen (NORCO) 7.5-325 MG tablet Take 1 tablet by mouth every 6 (six) hours as needed for moderate pain.    . pravastatin (PRAVACHOL) 20 MG tablet Take 20 mg by mouth daily.    Marland Kitchen tiotropium (SPIRIVA HANDIHALER) 18 MCG inhalation capsule Place 1 capsule (18 mcg total) into inhaler and inhale daily. Pharmacist please instruct in proper use. 30 capsule 0  . traZODone (DESYREL) 150 MG tablet Take 150 mg by mouth at bedtime.    Marland Kitchen venlafaxine  XR (EFFEXOR-XR) 150 MG 24 hr capsule Take 2 capsules (300 mg total) by mouth daily. 60 capsule 2  . alprazolam (XANAX) 2 MG tablet Take 1 tablet (2 mg total) by mouth 3 (three) times daily as needed for anxiety. (Patient not taking: Reported on 02/21/2017) 90 tablet 2  . Aspirin-Acetaminophen-Caffeine (EXCEDRIN PO) Take 2 tablets by mouth 3 (three) times daily as needed (back and leg pain).    Marland Kitchen azithromycin (ZITHROMAX Z-PAK) 250 MG tablet 2 po day one, then 1 daily x 4 days (Patient not taking: Reported on 02/21/2017) 5 tablet 0  . cyclobenzaprine (FLEXERIL) 5 MG tablet Take 5 mg by mouth 2 (two) times daily as needed.    . dicyclomine (BENTYL) 20 MG tablet Take 1 tablet (20 mg total) by mouth every 6 (six) hours as needed for spasms. (Patient not taking: Reported on 02/21/2017) 12 tablet 0  . Green Tea, Camillia sinensis, (GREEN TEA PO) Take 1 tablet by mouth daily.    Marland Kitchen HYDROcodone-acetaminophen (NORCO/VICODIN) 5-325 MG tablet Take 1 tablet by mouth every 6 (six) hours as needed for moderate pain. (Patient not taking: Reported on 02/21/2017) 45 tablet 0  . lidocaine (XYLOCAINE) 5 % ointment     . omeprazole (PRILOSEC) 20 MG capsule Take 20 mg by mouth daily.    Marland Kitchen oxyCODONE-acetaminophen (PERCOCET) 10-325 MG tablet Take 1 tablet by mouth every 8 (eight) hours as needed for pain. (Patient not taking: Reported on 02/07/2017) 30 tablet 0  .  oxyCODONE-acetaminophen (PERCOCET/ROXICET) 5-325 MG tablet Take 1 tablet by mouth every 8 (eight) hours as needed. (Patient not taking: Reported on 02/07/2017) 45 tablet 0  . oxyCODONE-acetaminophen (PERCOCET/ROXICET) 5-325 MG tablet Take 1 tablet by mouth every 6 (six) hours as needed for severe pain. (Patient not taking: Reported on 02/21/2017) 30 tablet 0  . oxyCODONE-acetaminophen (PERCOCET/ROXICET) 5-325 MG tablet Take 1 tablet by mouth every 8 (eight) hours as needed for severe pain. (Patient not taking: Reported on 02/21/2017) 20 tablet 0  . predniSONE (DELTASONE) 10 MG  tablet Take 2 tablets (20 mg total) by mouth daily. (Patient not taking: Reported on 02/07/2017) 14 tablet 0   No current facility-administered medications for this visit.     Patient confirms/reports the following allergies:  No Known Allergies  No orders of the defined types were placed in this encounter.   AUTHORIZATION INFORMATION Primary Insurance: [place],  [place] #: [id number],  Group #: [id number] Pre-Cert / Josem Kaufmann required:  Pre-Cert / Auth #:    SCHEDULE INFORMATION: Procedure has been scheduled as follows:  Date: , Time:   Location:   This Gastroenterology Pre-Precedure Review Form is being routed to the following provider(s):

## 2017-02-22 NOTE — Telephone Encounter (Signed)
Needs OV. Will need Propofol.

## 2017-02-23 NOTE — Telephone Encounter (Signed)
Left message with pt's daughter to have pt call me.

## 2017-02-23 NOTE — Telephone Encounter (Signed)
Pt has been scheduled ov with Walden Field, NP on 03/16/2017 at 10:30 AM.

## 2017-03-07 ENCOUNTER — Ambulatory Visit (INDEPENDENT_AMBULATORY_CARE_PROVIDER_SITE_OTHER): Payer: Medicare Other | Admitting: Orthopedic Surgery

## 2017-03-15 ENCOUNTER — Encounter (INDEPENDENT_AMBULATORY_CARE_PROVIDER_SITE_OTHER): Payer: Self-pay | Admitting: Orthopedic Surgery

## 2017-03-15 ENCOUNTER — Ambulatory Visit (INDEPENDENT_AMBULATORY_CARE_PROVIDER_SITE_OTHER): Payer: Medicare Other | Admitting: Orthopedic Surgery

## 2017-03-15 VITALS — Ht 62.0 in | Wt 150.0 lb

## 2017-03-15 DIAGNOSIS — S92351D Displaced fracture of fifth metatarsal bone, right foot, subsequent encounter for fracture with routine healing: Secondary | ICD-10-CM

## 2017-03-15 NOTE — Progress Notes (Signed)
Office Visit Note   Patient: [name]           Date of Birth: 1951-04-11           MRN: [id number] Visit Date: 03/15/2017              Requested by: Celene Squibb, MD [address] [place], [place] [address] PCP: Wende Neighbors, MD  Chief Complaint  Patient presents with  . Right Foot - Follow-up    5th MT fracture     HPI: Patient is status post a fracture base of fifth metatarsal right foot she states she's asymptomatic this time.  Assessment & Plan: Visit Diagnoses:  1. Fracture of base of fifth metatarsal bone of right foot with routine healing, subsequent encounter     Plan: Continue activities without restrictions.  Follow-Up Instructions: Return if symptoms worsen or fail to improve.   Ortho Exam  Patient is alert, oriented, no adenopathy, well-dressed, normal affect, normal respiratory effort. Patient has a normal gait. She has no pain to palpation or with weightbearing on the fifth metatarsal.  Imaging: No results found.  Labs: Lab Results  Component Value Date   REPTSTATUS 08/01/2015 FINAL 07/29/2015   CULT  07/29/2015    50,000 COLONIES/mL ESCHERICHIA COLI Performed at [facility] 07/29/2015    Orders:  No orders of the defined types were placed in this encounter.  No orders of the defined types were placed in this encounter.    Procedures: No procedures performed  Clinical Data: No additional findings.  ROS: Review of Systems  Objective: Vital Signs: Ht 5\' 2"  (1.575 m)   Wt 150 lb (68 kg)   BMI 27.44 kg/m   Specialty Comments:  No specialty comments available.  PMFS History: Patient Active Problem List   Diagnosis Date Noted  . Fracture, stress, metatarsal, right, sequela 01/11/2017  . Acute pain of right shoulder 12/16/2016  . Pain in right foot 12/16/2016  . Fracture of base of fifth metatarsal bone with routine healing 11/29/2016  . Humeral head fracture, right, closed, initial  encounter 11/17/2016  . Acute respiratory failure with hypercapnia ([place]) 06/16/2015  . Acute respiratory acidosis 06/16/2015  . COPD (chronic obstructive pulmonary disease) ([place]) 06/16/2015  . Narcotic overdose 06/15/2015  . Tobacco dependence 05/28/2015  . Acute respiratory failure with hypoxia ([facility]) 05/26/2015  . CAP (community acquired pneumonia) 05/25/2015  . Generalized anxiety disorder 01/22/2014   Past Medical History:  Diagnosis Date  . Anxiety    takes Xanax daily  . Brain tumor (benign) ([facility])   . Chronic back pain    bulding disc  . Chronic pain   . COPD (chronic obstructive pulmonary disease) ([facility])   . Degenerative disc disease   . Depression    takes Effexor daily  . Disc degeneration   . GERD (gastroesophageal reflux disease)    takes Omeprazole daily  . Headache   . Hepatitis    Hep C  . History of bronchitis    uses inhaler daily  . History of colon polyps   . History of staph infection   . Hyperlipemia    takes Pravastatin daily  . Hypertension    takes Lotrel daily  . Joint pain   . Narcotic overdose 06/13/2015  . Numbness in both hands   . Osteoporosis    Fosamax weekly  . Pinched nerve in shoulder   . Pneumonia 2012  . Substance abuse  benzos, opiates "from non-prescribed sources"    Family History  Problem Relation Age of Onset  . Bipolar disorder Daughter   . Drug abuse Son   . Drug abuse Grandchild   . Drug abuse Grandchild   . Diabetes Sister   . Cancer Sister     Past Surgical History:  Procedure Laterality Date  . ANKLE FRACTURE SURGERY Bilateral early 2000's  . COLONOSCOPY    . FACIAL FRACTURE SURGERY     in the late 90's  . KNEE SURGERY Right   . ORIF CALCANEOUS FRACTURE Right 07/13/2013   Procedure: RIGHT OPEN REDUCTION INTERNAL FIXATION (ORIF) CALCANEUS FRACTURE;  Surgeon: Newt Minion, MD;  Location: [facility];  Service: Orthopedics;  Laterality: Right;  . TUBAL LIGATION     Social History   Occupational History  . Not on  file.   Social History Main Topics  . Smoking status: Current Every Day Smoker    Packs/day: 0.50    Years: 40.00    Types: Cigarettes  . Smokeless tobacco: Former Systems developer  . Alcohol use No     Comment: denies  . Drug use: No  . Sexual activity: Yes    Birth control/ protection: Post-menopausal

## 2017-03-16 ENCOUNTER — Encounter: Payer: Self-pay | Admitting: Nurse Practitioner

## 2017-03-16 ENCOUNTER — Other Ambulatory Visit: Payer: Self-pay

## 2017-03-16 ENCOUNTER — Ambulatory Visit (INDEPENDENT_AMBULATORY_CARE_PROVIDER_SITE_OTHER): Payer: Medicaid Other | Admitting: Nurse Practitioner

## 2017-03-16 DIAGNOSIS — R69 Illness, unspecified: Secondary | ICD-10-CM | POA: Diagnosis not present

## 2017-03-16 DIAGNOSIS — Z86010 Personal history of colonic polyps: Secondary | ICD-10-CM | POA: Diagnosis not present

## 2017-03-16 MED ORDER — NA SULFATE-K SULFATE-MG SULF 17.5-3.13-1.6 GM/177ML PO SOLN: 1.0000 | ORAL | 0 refills | Status: DC

## 2017-03-16 NOTE — Assessment & Plan Note (Signed)
The patient has a history of colon polyps had a colonoscopy prior to her last colonoscopy. States she cannot remember who did her exam or what the specifics about the pathology were. Her last colonoscopy was about 6 years ago and report recommended a 10 year repeat exam. However, she states she received a letter that it is time for another colonoscopy 5 years after her previous colonoscopy. Given remote history of colon polyps and uncertainty regarding her colonoscopy history we will proceed with surveillance colonoscopy at this time.  Proceed with colonoscopy on propofol/MAC with Dr. Oneida Alar in the near future. The risks, benefits, and alternatives have been discussed in detail with the patient. They state understanding and desire to proceed.   History of chronic pain seeing a pain clinic. History of polysubstance abuse about 20-25 years ago. She is currently on Neurontin, Percocet, trazodone. Denies alcohol and drug use currently. Given polypharmacy we will plan for propofol/MAC to promote adequate sedation.

## 2017-03-16 NOTE — Progress Notes (Signed)
cc'ed to pcp °

## 2017-03-16 NOTE — Assessment & Plan Note (Signed)
Polypharmacy due to chronic medical conditions necessitating propofol/MAC for her colonoscopy. Further information below.

## 2017-03-16 NOTE — Progress Notes (Signed)
Primary Care Physician:  Wende Neighbors, MD Primary Gastroenterologist:  Dr. Oneida Alar  Chief Complaint  Patient presents with  . Colonoscopy    hx polyps, no problems    HPI:   [name] is a 66 y.o. female who presents on referral from primary care For surveillance colonoscopy. No PCP notes were provided. Previous colonoscopy report was provided dated 03/11/2011 which noted normal colonoscopy and recommended 10 year repeat exam. Per the triage sheet patient apparently had a colonoscopy about 3 years ago and is now due. Phone triage is deferred to office visit due to polypharmacy and likely need for propofol/MAC sedation.  Today she states she feels well. States she had a colonoscopy before the most recent one with a different GI, she isn't sure who. Knows they found polyps at that time. Unsure of pathology results. Denies abdominal pain, N/V, hemaochezia, melena, fever, chills, unintentional weight loss. Denies chest pain, dyspnea, dizziness, lightheadedness, syncope, near syncope. Denies any other upper or lower GI symptoms.  Past Medical History:  Diagnosis Date  . Anxiety    takes Xanax daily  . Brain tumor (benign) ([facility])   . Chronic back pain    bulding disc  . Chronic pain   . COPD (chronic obstructive pulmonary disease) ([facility])   . Degenerative disc disease   . Depression    takes Effexor daily  . Disc degeneration   . GERD (gastroesophageal reflux disease)    takes Omeprazole daily  . Headache   . Hepatitis    Hep C  . History of bronchitis    uses inhaler daily  . History of colon polyps   . History of staph infection   . Hyperlipemia    takes Pravastatin daily  . Hypertension    takes Lotrel daily  . Joint pain   . Narcotic overdose 06/13/2015  . Numbness in both hands   . Osteoporosis    Fosamax weekly  . Pinched nerve in shoulder   . Pneumonia 2012  . Substance abuse    benzos, opiates "from non-prescribed sources"    Past Surgical History:    Procedure Laterality Date  . ANKLE FRACTURE SURGERY Bilateral early 2000's  . COLONOSCOPY    . FACIAL FRACTURE SURGERY     in the late 90's  . KNEE SURGERY Right   . ORIF CALCANEOUS FRACTURE Right 07/13/2013   Procedure: RIGHT OPEN REDUCTION INTERNAL FIXATION (ORIF) CALCANEUS FRACTURE;  Surgeon: Newt Minion, MD;  Location: [facility];  Service: Orthopedics;  Laterality: Right;  . TUBAL LIGATION      Current Outpatient Prescriptions  Medication Sig Dispense Refill  . albuterol (PROVENTIL HFA;VENTOLIN HFA) 108 (90 BASE) MCG/ACT inhaler Inhale 2 puffs into the lungs every 6 (six) hours as needed for wheezing.    Marland Kitchen alendronate (FOSAMAX) 70 MG tablet Take 70 mg by mouth every 7 (seven) days. On mondays. Take with a full glass of water on an empty stomach.     Marland Kitchen amLODipine-benazepril (LOTREL) 5-10 MG per capsule Take 1 capsule by mouth daily.    . Aspirin-Acetaminophen-Caffeine (EXCEDRIN PO) Take 2 tablets by mouth 3 (three) times daily as needed (back and leg pain).    . Cholecalciferol (VITAMIN D-3) 5000 units TABS Take 1 tablet by mouth daily.    Marland Kitchen gabapentin (NEURONTIN) 800 MG tablet Take 800 mg by mouth 4 (four) times daily.    [place] Cowden Tea, Camillia sinensis, (GREEN TEA PO) Take 1 tablet by mouth daily.    Marland Kitchen  oxyCODONE-acetaminophen (PERCOCET) 7.5-325 MG tablet Take 1 tablet by mouth 4 (four) times daily.    . pravastatin (PRAVACHOL) 20 MG tablet Take 20 mg by mouth daily.    Marland Kitchen tiotropium (SPIRIVA HANDIHALER) 18 MCG inhalation capsule Place 1 capsule (18 mcg total) into inhaler and inhale daily. Pharmacist please instruct in proper use. 30 capsule 0  . traZODone (DESYREL) 150 MG tablet Take 150 mg by mouth at bedtime.    Marland Kitchen venlafaxine XR (EFFEXOR-XR) 150 MG 24 hr capsule Take 2 capsules (300 mg total) by mouth daily. 60 capsule 2   No current facility-administered medications for this visit.     Allergies as of 03/16/2017  . (No Known Allergies)    Family History  Problem Relation Age  of Onset  . Bipolar disorder Daughter   . Drug abuse Son   . Drug abuse Grandchild   . Drug abuse Grandchild   . Diabetes Sister   . Cancer Sister   . Colon cancer Neg Hx     Social History   Social History  . Marital status: Widowed    Spouse name: N/A  . Number of children: N/A  . Years of education: N/A   Occupational History  . Not on file.   Social History Main Topics  . Smoking status: Current Every Day Smoker    Packs/day: 0.50    Years: 40.00    Types: Cigarettes  . Smokeless tobacco: Never Used  . Alcohol use No     Comment: denies  . Drug use: No     Comment: Previously used marijuana and cocaine (last use about 25 years ago)  . Sexual activity: Yes    Birth control/ protection: Post-menopausal   Other Topics Concern  . Not on file   Social History Narrative  . No narrative on file    Review of Systems: Complete ROS negative except as per HPI.    Physical Exam: BP 134/79   Pulse 82   Temp 97.8 F (36.6 C) (Oral)   Ht 5' 2"  (1.575 m)   Wt 142 lb 6.4 oz (64.6 kg)   BMI 26.05 kg/m  General:   Alert and oriented. Pleasant and cooperative. Well-nourished and well-developed. Appears high energy/anxious Head:  Normocephalic and atraumatic. Eyes:  Without icterus, sclera clear and conjunctiva pink.  Ears:  Normal auditory acuity. Cardiovascular:  S1, S2 present without murmurs appreciated. Extremities without clubbing or edema. Respiratory:  Clear to auscultation bilaterally. No wheezes, rales, or rhonchi. No distress.  Gastrointestinal:  +BS, rounded but soft, non-tender and non-distended. No HSM noted. No guarding or rebound. No masses appreciated.  Rectal:  Deferred  Musculoskalatal:  Symmetrical without gross deformities. Neurologic:  Alert and oriented x4;  grossly normal neurologically. Psych:  Alert and cooperative. Normal mood and affect. Heme/Lymph/Immune: No excessive bruising noted.    03/16/2017 11:41 AM   Disclaimer: This note was  dictated with voice recognition software. Similar sounding words can inadvertently be transcribed and may not be corrected upon review.

## 2017-03-16 NOTE — Patient Instructions (Signed)
1. We will schedule your procedure for you. 2. Further recommendations to be based on the results of your procedure. 3. Return for follow-up based on postprocedure recommendations. 4. Call if you have any questions before

## 2017-03-17 ENCOUNTER — Telehealth: Payer: Self-pay

## 2017-03-17 NOTE — Telephone Encounter (Signed)
Pt called office and requested to reschedule TCS that was originally for 03/29/17 with [facility] d/t she has another appt that day. TCS w/Propofol rescheduled to 04/05/17 at 10:30am. Will mail new instructions when pre-op is rescheduled.

## 2017-03-21 NOTE — Telephone Encounter (Signed)
Called and informed Endo scheduler that TCS was changed to 04/05/17. New pre-op appt 03/31/07 at 11:00am. Letter for pre-op included with procedure instructions and mailed to pt.

## 2017-03-24 ENCOUNTER — Other Ambulatory Visit (HOSPITAL_COMMUNITY): Payer: Self-pay

## 2017-03-28 NOTE — Patient Instructions (Signed)
[name]  03/28/2017     @PREFPERIOPPHARMACY @   Your procedure is scheduled on  04/05/2017   Report to [facility] at  900  A.M.  Call this number if you have problems the morning of surgery:  [phone]   Remember:  Do not eat food or drink liquids after midnight.  Take these medicines the morning of surgery with A SIP OF WATER  Amlodipine, neurontin, oxycodone, effexor.   Do not wear jewelry, make-up or nail polish.  Do not wear lotions, powders, or perfumes, or deoderant.  Do not shave 48 hours prior to surgery.  Men may shave face and neck.  Do not bring valuables to the hospital.  [facility] is not responsible for any belongings or valuables.  Contacts, dentures or bridgework may not be worn into surgery.  Leave your suitcase in the car.  After surgery it may be brought to your room.  For patients admitted to the hospital, discharge time will be determined by your treatment team.  Patients discharged the day of surgery will not be allowed to drive home.   Name and phone number of your driver:   family Special instructions:  Follow the diet and prep instructions given to you by Dr Nona Dell office.  Please read over the following fact sheets that you were given. Anesthesia Post-op Instructions and Care and Recovery After Surgery       Colonoscopy, Adult A colonoscopy is an exam to look at the entire large intestine. During the exam, a lubricated, bendable tube is inserted into the anus and then passed into the rectum, colon, and other parts of the large intestine. A colonoscopy is often done as a part of normal colorectal screening or in response to certain symptoms, such as anemia, persistent diarrhea, abdominal pain, and blood in the stool. The exam can help screen for and diagnose medical problems, including:  Tumors.  Polyps.  Inflammation.  Areas of bleeding. Tell a health care provider about:  Any allergies you have.  All  medicines you are taking, including vitamins, herbs, eye drops, creams, and over-the-counter medicines.  Any problems you or family members have had with anesthetic medicines.  Any blood disorders you have.  Any surgeries you have had.  Any medical conditions you have.  Any problems you have had passing stool. What are the risks? Generally, this is a safe procedure. However, problems may occur, including:  Bleeding.  A tear in the intestine.  A reaction to medicines given during the exam.  Infection (rare). What happens before the procedure? Eating and drinking restrictions  Follow instructions from your health care provider about eating and drinking, which may include:  A few days before the procedure - follow a low-fiber diet. Avoid nuts, seeds, dried fruit, raw fruits, and vegetables.  1-3 days before the procedure - follow a clear liquid diet. Drink only clear liquids, such as clear broth or bouillon, black coffee or tea, clear juice, clear soft drinks or sports drinks, gelatin dessert, and popsicles. Avoid any liquids that contain red or purple dye.  On the day of the procedure - do not eat or drink anything during the 2 hours before the procedure, or within the time period that your health care provider recommends. Bowel prep  If you were prescribed an oral bowel prep to clean out your colon:  Take it as told by your health care provider. Starting the day before your procedure,  you will need to drink a large amount of medicated liquid. The liquid will cause you to have multiple loose stools until your stool is almost clear or light green.  If your skin or anus gets irritated from diarrhea, you may use these to relieve the irritation:  Medicated wipes, such as adult wet wipes with aloe and vitamin E.  A skin soothing-product like petroleum jelly.  If you vomit while drinking the bowel prep, take a break for up to 60 minutes and then begin the bowel prep again. If  vomiting continues and you cannot take the bowel prep without vomiting, call your health care provider. General instructions   Ask your health care provider about changing or stopping your regular medicines. This is especially important if you are taking diabetes medicines or blood thinners.  Plan to have someone take you home from the hospital or clinic. What happens during the procedure?  An IV tube may be inserted into one of your veins.  You will be given medicine to help you relax (sedative).  To reduce your risk of infection:  Your health care team will wash or sanitize their hands.  Your anal area will be washed with soap.  You will be asked to lie on your side with your knees bent.  Your health care provider will lubricate a long, thin, flexible tube. The tube will have a camera and a light on the end.  The tube will be inserted into your anus.  The tube will be gently eased through your rectum and colon.  Air will be delivered into your colon to keep it open. You may feel some pressure or cramping.  The camera will be used to take images during the procedure.  A small tissue sample may be removed from your body to be examined under a microscope (biopsy). If any potential problems are found, the tissue will be sent to a lab for testing.  If small polyps are found, your health care provider may remove them and have them checked for cancer cells.  The tube that was inserted into your anus will be slowly removed. The procedure may vary among health care providers and hospitals. What happens after the procedure?  Your blood pressure, heart rate, breathing rate, and blood oxygen level will be monitored until the medicines you were given have worn off.  Do not drive for 24 hours after the exam.  You may have a small amount of blood in your stool.  You may pass gas and have mild abdominal cramping or bloating due to the air that was used to inflate your colon during the  exam.  It is up to you to get the results of your procedure. Ask your health care provider, or the department performing the procedure, when your results will be ready. This information is not intended to replace advice given to you by your health care provider. Make sure you discuss any questions you have with your health care provider. Document Released: 12/03/2000 Document Revised: 10/06/2016 Document Reviewed: 02/17/2016 Elsevier Interactive Patient Education  2017 [employer]  Colonoscopy, Adult, Care After This sheet gives you information about how to care for yourself after your procedure. Your health care provider may also give you more specific instructions. If you have problems or questions, contact your health care provider. What can I expect after the procedure? After the procedure, it is common to have:  A small amount of blood in your stool for 24 hours after the procedure.  Some gas.  Mild abdominal cramping or bloating. Follow these instructions at home: General instructions    For the first 24 hours after the procedure:  Do not drive or use machinery.  Do not sign important documents.  Do not drink alcohol.  Do your regular daily activities at a slower pace than normal.  Eat soft, easy-to-digest foods.  Rest often.  Take over-the-counter or prescription medicines only as told by your health care provider.  It is up to you to get the results of your procedure. Ask your health care provider, or the department performing the procedure, when your results will be ready. Relieving cramping and bloating   Try walking around when you have cramps or feel bloated.  Apply heat to your abdomen as told by your health care provider. Use a heat source that your health care provider recommends, such as a moist heat pack or a heating pad.  Place a towel between your skin and the heat source.  Leave the heat on for 20-30 minutes.  Remove the heat if your skin turns  bright red. This is especially important if you are unable to feel pain, heat, or cold. You may have a greater risk of getting burned. Eating and drinking   Drink enough fluid to keep your urine clear or pale yellow.  Resume your normal diet as instructed by your health care provider. Avoid heavy or fried foods that are hard to digest.  Avoid drinking alcohol for as long as instructed by your health care provider. Contact a health care provider if:  You have blood in your stool 2-3 days after the procedure. Get help right away if:  You have more than a small spotting of blood in your stool.  You pass large blood clots in your stool.  Your abdomen is swollen.  You have nausea or vomiting.  You have a fever.  You have increasing abdominal pain that is not relieved with medicine. This information is not intended to replace advice given to you by your health care provider. Make sure you discuss any questions you have with your health care provider. Document Released: 07/20/2004 Document Revised: 08/30/2016 Document Reviewed: 02/17/2016 Elsevier Interactive Patient Education  2017 [place] Anesthesia is a term that refers to techniques, procedures, and medicines that help a person stay safe and comfortable during a medical procedure. Monitored anesthesia care, or sedation, is one type of anesthesia. Your anesthesia specialist may recommend sedation if you will be having a procedure that does not require you to be unconscious, such as:  Cataract surgery.  A dental procedure.  A biopsy.  A colonoscopy. During the procedure, you may receive a medicine to help you relax (sedative). There are three levels of sedation:  Mild sedation. At this level, you may feel awake and relaxed. You will be able to follow directions.  Moderate sedation. At this level, you will be sleepy. You may not remember the procedure.  Deep sedation. At this level, you will be  asleep. You will not remember the procedure. The more medicine you are given, the deeper your level of sedation will be. Depending on how you respond to the procedure, the anesthesia specialist may change your level of sedation or the type of anesthesia to fit your needs. An anesthesia specialist will monitor you closely during the procedure. Let your health care provider know about:  Any allergies you have.  All medicines you are taking, including vitamins, herbs, eye drops, creams, and  over-the-counter medicines.  Any use of steroids (by mouth or as a cream).  Any problems you or family members have had with sedatives and anesthetic medicines.  Any blood disorders you have.  Any surgeries you have had.  Any medical conditions you have, such as sleep apnea.  Whether you are pregnant or may be pregnant.  Any use of cigarettes, alcohol, or street drugs. What are the risks? Generally, this is a safe procedure. However, problems may occur, including:  Getting too much medicine (oversedation).  Nausea.  Allergic reaction to medicines.  Trouble breathing. If this happens, a breathing tube may be used to help with breathing. It will be removed when you are awake and breathing on your own.  Heart trouble.  Lung trouble. Before the procedure Staying hydrated  Follow instructions from your health care provider about hydration, which may include:  Up to 2 hours before the procedure - you may continue to drink clear liquids, such as water, clear fruit juice, black coffee, and plain tea. Eating and drinking restrictions  Follow instructions from your health care provider about eating and drinking, which may include:  8 hours before the procedure - stop eating heavy meals or foods such as meat, fried foods, or fatty foods.  6 hours before the procedure - stop eating light meals or foods, such as toast or cereal.  6 hours before the procedure - stop drinking milk or drinks that  contain milk.  2 hours before the procedure - stop drinking clear liquids. Medicines  Ask your health care provider about:  Changing or stopping your regular medicines. This is especially important if you are taking diabetes medicines or blood thinners.  Taking medicines such as aspirin and ibuprofen. These medicines can thin your blood. Do not take these medicines before your procedure if your health care provider instructs you not to. Tests and exams  You will have a physical exam.  You may have blood tests done to show:  How well your kidneys and liver are working.  How well your blood can clot.  General instructions  Plan to have someone take you home from the hospital or clinic.  If you will be going home right after the procedure, plan to have someone with you for 24 hours. What happens during the procedure?  Your blood pressure, heart rate, breathing, level of pain and overall condition will be monitored.  An IV tube will be inserted into one of your veins.  Your anesthesia specialist will give you medicines as needed to keep you comfortable during the procedure. This may mean changing the level of sedation.  The procedure will be performed. After the procedure  Your blood pressure, heart rate, breathing rate, and blood oxygen level will be monitored until the medicines you were given have worn off.  Do not drive for 24 hours if you received a sedative.  You may:  Feel sleepy, clumsy, or nauseous.  Feel forgetful about what happened after the procedure.  Have a sore throat if you had a breathing tube during the procedure.  Vomit. This information is not intended to replace advice given to you by your health care provider. Make sure you discuss any questions you have with your health care provider. Document Released: 09/01/2005 Document Revised: 05/14/2016 Document Reviewed: 03/28/2016 Elsevier Interactive Patient Education  2017 [place], Care After These instructions provide you with information about caring for yourself after your procedure. Your health care provider may also give you  more specific instructions. Your treatment has been planned according to current medical practices, but problems sometimes occur. Call your health care provider if you have any problems or questions after your procedure. What can I expect after the procedure? After your procedure, it is common to:  Feel sleepy for several hours.  Feel clumsy and have poor balance for several hours.  Feel forgetful about what happened after the procedure.  Have poor judgment for several hours.  Feel nauseous or vomit.  Have a sore throat if you had a breathing tube during the procedure. Follow these instructions at home: For at least 24 hours after the procedure:    Do not:  Participate in activities in which you could fall or become injured.  Drive.  Use heavy machinery.  Drink alcohol.  Take sleeping pills or medicines that cause drowsiness.  Make important decisions or sign legal documents.  Take care of children on your own.  Rest. Eating and drinking   Follow the diet that is recommended by your health care provider.  If you vomit, drink water, juice, or soup when you can drink without vomiting.  Make sure you have little or no nausea before eating solid foods. General instructions   Have a responsible adult stay with you until you are awake and alert.  Take over-the-counter and prescription medicines only as told by your health care provider.  If you smoke, do not smoke without supervision.  Keep all follow-up visits as told by your health care provider. This is important. Contact a health care provider if:  You keep feeling nauseous or you keep vomiting.  You feel light-headed.  You develop a rash.  You have a fever. Get help right away if:  You have trouble breathing. This information is not  intended to replace advice given to you by your health care provider. Make sure you discuss any questions you have with your health care provider. Document Released: 03/28/2016 Document Revised: 07/28/2016 Document Reviewed: 03/28/2016 Elsevier Interactive Patient Education  2017 [employer].

## 2017-03-30 ENCOUNTER — Encounter (HOSPITAL_COMMUNITY)
Admission: RE | Admit: 2017-03-30 | Discharge: 2017-03-30 | Disposition: A | Discharge status: Home / Self Care | Payer: Medicare Other | Source: Ambulatory Visit | Attending: Gastroenterology | Admitting: Gastroenterology

## 2017-03-30 ENCOUNTER — Encounter (HOSPITAL_COMMUNITY): Payer: Self-pay

## 2017-03-30 DIAGNOSIS — Z01812 Encounter for preprocedural laboratory examination: Secondary | ICD-10-CM | POA: Insufficient documentation

## 2017-03-30 DIAGNOSIS — Z01810 Encounter for preprocedural cardiovascular examination: Secondary | ICD-10-CM | POA: Insufficient documentation

## 2017-03-30 LAB — CBC WITH DIFFERENTIAL/PLATELET
BASOS ABS: 0.1 10*3/uL (ref 0.0–0.1)
Basophils Relative: 1 %
Eosinophils Absolute: 0.1 10*3/uL (ref 0.0–0.7)
Eosinophils Relative: 1 %
HEMATOCRIT: 40.6 % (ref 36.0–46.0)
Hemoglobin: 13.4 g/dL (ref 12.0–15.0)
LYMPHS PCT: 32 %
Lymphs Abs: 2.6 10*3/uL (ref 0.7–4.0)
MCH: 31.8 pg (ref 26.0–34.0)
MCHC: 33 g/dL (ref 30.0–36.0)
MCV: 96.4 fL (ref 78.0–100.0)
MONO ABS: 0.9 10*3/uL (ref 0.1–1.0)
MONOS PCT: 10 %
NEUTROS ABS: 4.6 10*3/uL (ref 1.7–7.7)
Neutrophils Relative %: 56 %
Platelets: 251 10*3/uL (ref 150–400)
RBC: 4.21 MIL/uL (ref 3.87–5.11)
RDW: 14.6 % (ref 11.5–15.5)
WBC: 8.2 10*3/uL (ref 4.0–10.5)

## 2017-03-30 LAB — BASIC METABOLIC PANEL
ANION GAP: 7 (ref 5–15)
BUN: 10 mg/dL (ref 6–20)
CALCIUM: 9 mg/dL (ref 8.9–10.3)
CO2: 29 mmol/L (ref 22–32)
Chloride: 101 mmol/L (ref 101–111)
Creatinine, Ser: 0.63 mg/dL (ref 0.44–1.00)
GFR calc Af Amer: >60 mL/min (ref >60)
GFR calc non Af Amer: >60 mL/min (ref >60)
GLUCOSE: 94 mg/dL (ref 65–99)
Potassium: 4.2 mmol/L (ref 3.5–5.1)
Sodium: 137 mmol/L (ref 135–145)

## 2017-04-04 NOTE — Progress Notes (Signed)
REVIEWED-NO ADDITIONAL RECOMMENDATIONS. 

## 2017-04-05 ENCOUNTER — Encounter (HOSPITAL_COMMUNITY)
Admission: RE | Disposition: A | Discharge status: Home / Self Care | Payer: Self-pay | Source: Ambulatory Visit | Attending: Gastroenterology

## 2017-04-05 ENCOUNTER — Ambulatory Visit (HOSPITAL_COMMUNITY)
Admission: RE | Admit: 2017-04-05 | Discharge: 2017-04-05 | Disposition: A | Discharge status: Home / Self Care | Payer: Medicare Other | Source: Ambulatory Visit | Attending: Gastroenterology | Admitting: Gastroenterology

## 2017-04-05 ENCOUNTER — Ambulatory Visit (HOSPITAL_COMMUNITY): Payer: Medicare Other | Admitting: Anesthesiology

## 2017-04-05 ENCOUNTER — Encounter (HOSPITAL_COMMUNITY): Payer: Self-pay

## 2017-04-05 DIAGNOSIS — F17210 Nicotine dependence, cigarettes, uncomplicated: Secondary | ICD-10-CM | POA: Diagnosis not present

## 2017-04-05 DIAGNOSIS — J449 Chronic obstructive pulmonary disease, unspecified: Secondary | ICD-10-CM | POA: Insufficient documentation

## 2017-04-05 DIAGNOSIS — I10 Essential (primary) hypertension: Secondary | ICD-10-CM | POA: Insufficient documentation

## 2017-04-05 DIAGNOSIS — D123 Benign neoplasm of transverse colon: Secondary | ICD-10-CM

## 2017-04-05 DIAGNOSIS — F419 Anxiety disorder, unspecified: Secondary | ICD-10-CM | POA: Diagnosis not present

## 2017-04-05 DIAGNOSIS — M810 Age-related osteoporosis without current pathological fracture: Secondary | ICD-10-CM | POA: Diagnosis not present

## 2017-04-05 DIAGNOSIS — K219 Gastro-esophageal reflux disease without esophagitis: Secondary | ICD-10-CM | POA: Diagnosis not present

## 2017-04-05 DIAGNOSIS — Z86010 Personal history of colonic polyps: Secondary | ICD-10-CM | POA: Insufficient documentation

## 2017-04-05 DIAGNOSIS — Z1211 Encounter for screening for malignant neoplasm of colon: Secondary | ICD-10-CM | POA: Diagnosis not present

## 2017-04-05 DIAGNOSIS — Z7983 Long term (current) use of bisphosphonates: Secondary | ICD-10-CM | POA: Insufficient documentation

## 2017-04-05 DIAGNOSIS — D122 Benign neoplasm of ascending colon: Secondary | ICD-10-CM | POA: Insufficient documentation

## 2017-04-05 DIAGNOSIS — Q438 Other specified congenital malformations of intestine: Secondary | ICD-10-CM | POA: Insufficient documentation

## 2017-04-05 DIAGNOSIS — Z79899 Other long term (current) drug therapy: Secondary | ICD-10-CM | POA: Diagnosis not present

## 2017-04-05 DIAGNOSIS — E785 Hyperlipidemia, unspecified: Secondary | ICD-10-CM | POA: Insufficient documentation

## 2017-04-05 DIAGNOSIS — Z7982 Long term (current) use of aspirin: Secondary | ICD-10-CM | POA: Diagnosis not present

## 2017-04-05 DIAGNOSIS — D120 Benign neoplasm of cecum: Secondary | ICD-10-CM | POA: Insufficient documentation

## 2017-04-05 DIAGNOSIS — K648 Other hemorrhoids: Secondary | ICD-10-CM | POA: Insufficient documentation

## 2017-04-05 DIAGNOSIS — F329 Major depressive disorder, single episode, unspecified: Secondary | ICD-10-CM | POA: Diagnosis not present

## 2017-04-05 HISTORY — PX: POLYPECTOMY: SHX5525

## 2017-04-05 HISTORY — PX: COLONOSCOPY WITH PROPOFOL: SHX5780

## 2017-04-05 SURGERY — COLONOSCOPY WITH PROPOFOL
Anesthesia: Monitor Anesthesia Care

## 2017-04-05 MED ORDER — FENTANYL CITRATE (PF) 100 MCG/2ML IJ SOLN
INTRAMUSCULAR | Status: AC
  Filled 2017-04-05: qty 2

## 2017-04-05 MED ORDER — MIDAZOLAM HCL 2 MG/2ML IJ SOLN
INTRAMUSCULAR | Status: AC
  Filled 2017-04-05: qty 2

## 2017-04-05 MED ORDER — MIDAZOLAM HCL 5 MG/5ML IJ SOLN
INTRAMUSCULAR | Status: DC | PRN
  Administered 2017-04-05 (×2): 1 mg via INTRAVENOUS

## 2017-04-05 MED ORDER — MIDAZOLAM HCL 2 MG/2ML IJ SOLN
1.0000 mg | INTRAMUSCULAR | Status: AC
  Administered 2017-04-05: 2 mg via INTRAVENOUS

## 2017-04-05 MED ORDER — CHLORHEXIDINE GLUCONATE CLOTH 2 % EX PADS: 6.0000 | MEDICATED_PAD | Freq: Once | CUTANEOUS | Status: DC

## 2017-04-05 MED ORDER — LACTATED RINGERS IV SOLN
INTRAVENOUS | Status: DC
  Administered 2017-04-05 (×2): via INTRAVENOUS

## 2017-04-05 MED ORDER — FENTANYL CITRATE (PF) 100 MCG/2ML IJ SOLN
25.0000 ug | INTRAMUSCULAR | Status: AC
  Administered 2017-04-05 (×2): 25 ug via INTRAVENOUS

## 2017-04-05 MED ORDER — PROPOFOL 500 MG/50ML IV EMUL
INTRAVENOUS | Status: DC | PRN
  Administered 2017-04-05: 11:00:00 via INTRAVENOUS
  Administered 2017-04-05: 150 ug/kg/min via INTRAVENOUS

## 2017-04-05 NOTE — Transfer of Care (Signed)
Immediate Anesthesia Transfer of Care Note  Patient: [name]  Procedure(s) Performed: Procedure(s) with comments: COLONOSCOPY WITH PROPOFOL (N/A) - 1030-rescheduled 4/17 per Tretha Sciara  POLYPECTOMY - colon  Patient Location: PACU  Anesthesia Type:MAC  Level of Consciousness: awake, alert , oriented and patient cooperative  Airway & Oxygen Therapy: Patient Spontanous Breathing and Patient connected to nasal cannula oxygen  Post-op Assessment: Report given to RN and Post -op Vital signs reviewed and stable  Post vital signs: Reviewed and stable  Last Vitals:  Vitals:   04/05/17 0922  BP: 102/63  Pulse: 76  Resp: 17  Temp: 36.7 C    Last Pain:  Vitals:   04/05/17 1034  TempSrc:   PainSc: 4       Patients Stated Pain Goal: 3 (13/88/71 9597)  Complications: No apparent anesthesia complications

## 2017-04-05 NOTE — Anesthesia Postprocedure Evaluation (Signed)
Anesthesia Post Note  Patient: [name]  Procedure(s) Performed: Procedure(s) (LRB): COLONOSCOPY WITH PROPOFOL (N/A) POLYPECTOMY  Patient location during evaluation: PACU Anesthesia Type: MAC Level of consciousness: awake and alert and oriented Pain management: pain level controlled Vital Signs Assessment: post-procedure vital signs reviewed and stable Respiratory status: spontaneous breathing and patient connected to nasal cannula oxygen Cardiovascular status: stable Postop Assessment: no signs of nausea or vomiting Anesthetic complications: no     Last Vitals:  Vitals:   04/05/17 0922  BP: 102/63  Pulse: 76  Resp: 17  Temp: 36.7 C    Last Pain:  Vitals:   04/05/17 1034  TempSrc:   PainSc: 4                  ADAMS, AMY A

## 2017-04-05 NOTE — H&P (Signed)
Primary Care Physician:  Wende Neighbors, MD Primary Gastroenterologist:  Dr. Oneida Alar  Pre-Procedure History & Physical: HPI:  [name] is a 66 y.o. female here for PERSONAL HISTORY OF POLYPS.  Past Medical History:  Diagnosis Date  . Anxiety    takes Xanax daily  . Brain tumor (benign) ([place])   . Chronic back pain    bulding disc  . Chronic pain   . COPD (chronic obstructive pulmonary disease) ([place])   . Degenerative disc disease   . Depression    takes Effexor daily  . Disc degeneration   . GERD (gastroesophageal reflux disease)    takes Omeprazole daily  . Headache   . Hepatitis    Hep C  . History of bronchitis    uses inhaler daily  . History of colon polyps   . History of staph infection   . Hyperlipemia    takes Pravastatin daily  . Hypertension    takes Lotrel daily  . Joint pain   . Narcotic overdose 06/13/2015  . Numbness in both hands   . Osteoporosis    Fosamax weekly  . Pinched nerve in shoulder   . Pneumonia 2012  . Substance abuse    benzos, opiates "from non-prescribed sources"    Past Surgical History:  Procedure Laterality Date  . ANKLE FRACTURE SURGERY Bilateral early 2000's  . COLONOSCOPY    . FACIAL FRACTURE SURGERY     in the late 90's  . KNEE SURGERY Right   . ORIF CALCANEOUS FRACTURE Right 07/13/2013   Procedure: RIGHT OPEN REDUCTION INTERNAL FIXATION (ORIF) CALCANEUS FRACTURE;  Surgeon: Newt Minion, MD;  Location: [facility];  Service: Orthopedics;  Laterality: Right;  . TUBAL LIGATION      Prior to Admission medications   Medication Sig Start Date End Date Taking? Authorizing Provider  albuterol (PROVENTIL HFA;VENTOLIN HFA) 108 (90 BASE) MCG/ACT inhaler Inhale 2 puffs into the lungs every 6 (six) hours as needed for wheezing.   Yes Historical Provider, MD  alendronate (FOSAMAX) 70 MG tablet Take 70 mg by mouth every 7 (seven) days. On Mondays. Take with a full glass of water on an empty stomach.   Yes Historical Provider, MD   amLODipine-benazepril (LOTREL) 5-10 MG per capsule Take 1 capsule by mouth daily.   Yes Historical Provider, MD  Aspirin-Acetaminophen-Caffeine (EXCEDRIN PO) Take 2 tablets by mouth 3 (three) times daily as needed (back and leg pain).   Yes Historical Provider, MD  gabapentin (NEURONTIN) 800 MG tablet Take 800 mg by mouth 4 (four) times daily.   Yes Historical Provider, MD  Nyoka Cowden Tea, Camillia sinensis, (GREEN TEA PO) Take 1 tablet by mouth daily.   Yes Historical Provider, MD  Na Sulfate-K Sulfate-Mg Sulf (SUPREP BOWEL PREP KIT) 17.5-3.13-1.6 GM/180ML SOLN Take 1 kit by mouth as directed. 03/16/17  Yes Danie Binder, MD  oxyCODONE-acetaminophen (PERCOCET) 7.5-325 MG tablet Take 1 tablet by mouth every 6 (six) hours as needed (pain).  02/14/17  Yes Historical Provider, MD  pravastatin (PRAVACHOL) 20 MG tablet Take 20 mg by mouth daily.   Yes Historical Provider, MD  tiotropium (SPIRIVA HANDIHALER) 18 MCG inhalation capsule Place 1 capsule (18 mcg total) into inhaler and inhale daily. Pharmacist please instruct in proper use. 05/29/15  Yes Samuella Cota, MD  traZODone (DESYREL) 150 MG tablet Take 150 mg by mouth at bedtime. 04/05/16  Yes Historical Provider, MD  venlafaxine XR (EFFEXOR-XR) 150 MG 24 hr capsule Take 2 capsules (300 mg total)  by mouth daily. 10/08/15  Yes Cloria Spring, MD    Allergies as of 03/16/2017  . (No Known Allergies)    Family History  Problem Relation Age of Onset  . Bipolar disorder Daughter   . Drug abuse Son   . Drug abuse Grandchild   . Drug abuse Grandchild   . Diabetes Sister   . Cancer Sister   . Colon cancer Neg Hx     Social History   Social History  . Marital status: Married    Spouse name: N/A  . Number of children: N/A  . Years of education: N/A   Occupational History  . Not on file.   Social History Main Topics  . Smoking status: Current Every Day Smoker    Packs/day: 0.50    Years: 40.00    Types: Cigarettes  . Smokeless tobacco:  Never Used  . Alcohol use No     Comment: denies  . Drug use: No     Comment: Previously used marijuana and cocaine (last use about 25 years ago)  . Sexual activity: Yes    Birth control/ protection: Post-menopausal   Other Topics Concern  . Not on file   Social History Narrative  . No narrative on file    Review of Systems: See HPI, otherwise negative ROS   Physical Exam: BP 102/63   Pulse 76   Temp 98 F (36.7 C) (Oral)   Resp 17   SpO2 93%  General:   Alert,  pleasant and cooperative in NAD Head:  Normocephalic and atraumatic. Neck:  Supple; Lungs:  Clear throughout to auscultation.    Heart:  Regular rate and rhythm. Abdomen:  Soft, nontender and nondistended. Normal bowel sounds, without guarding, and without rebound.   Neurologic:  Alert and  oriented x4;  grossly normal neurologically.  Impression/Plan:     PERSONAL HISTORY OF POLYPS.  PLAN: 1. TCS TODAY. DISCUSSED PROCEDURE, BENEFITS, & RISKS: < 1% chance of medication reaction, bleeding, perforation, or rupture of spleen/liver.

## 2017-04-05 NOTE — Anesthesia Preprocedure Evaluation (Signed)
Anesthesia Evaluation  Patient identified by MRN, date of birth, ID band Patient awake    Reviewed: Allergy & Precautions, NPO status , Patient's Chart, lab work & pertinent test results  Airway Mallampati: II  TM Distance: >3 FB     Dental  (+) Partial Upper, Partial Lower   Pulmonary COPD, Current Smoker,    breath sounds clear to auscultation       Cardiovascular hypertension,  Rhythm:Regular Rate:Normal     Neuro/Psych  Headaches, PSYCHIATRIC DISORDERS Anxiety Depression    GI/Hepatic GERD  ,(+) Hepatitis -  Endo/Other    Renal/GU      Musculoskeletal   Abdominal   Peds  Hematology   Anesthesia Other Findings   Reproductive/Obstetrics                             Anesthesia Physical Anesthesia Plan  ASA: III  Anesthesia Plan: MAC   Post-op Pain Management:    Induction: Intravenous  Airway Management Planned: Simple Face Mask  Additional Equipment:   Intra-op Plan:   Post-operative Plan: Extubation in OR  Informed Consent:   Plan Discussed with:   Anesthesia Plan Comments:         Anesthesia Quick Evaluation

## 2017-04-05 NOTE — Discharge Instructions (Signed)
You had 13 polyps removed. You have internal hemorrhoids.   TO REDUCE YOUR RISK FROM BLEEDING AFTER HAVING POLYPS REMOVED, HOLD EXCEDRIN HA MEDICINE AND ASPIRIN FOR 5 DAYS. RE-START APR 23.   Cut down and then quit smoking. It is increasing your risk of colon cancer.  DRINK WATER TO KEEP YOUR URINE LIGHT YELLOW.  FOLLOW A HIGH FIBER DIET. AVOID ITEMS THAT CAUSE BLOATING & GAS. SEE INFO BELOW.  YOUR BIOPSY RESULTS WILL BE AVAILABLE IN MY CHART AFTER APR 20 AND MY OFFICE WILL CONTACT YOU IN 10-14 DAYS WITH YOUR RESULTS.   Next colonoscopy in 3 years.    Colonoscopy Care After Read the instructions outlined below and refer to this sheet in the next week. These discharge instructions provide you with general information on caring for yourself after you leave the hospital. While your treatment has been planned according to the most current medical practices available, unavoidable complications occasionally occur. If you have any problems or questions after discharge, call DR. Jossue Rubenstein, [phone].  ACTIVITY  You may resume your regular activity, but move at a slower pace for the next 24 hours.   Take frequent rest periods for the next 24 hours.   Walking will help get rid of the air and reduce the bloated feeling in your belly (abdomen).   No driving for 24 hours (because of the medicine (anesthesia) used during the test).   You may shower.   Do not sign any important legal documents or operate any machinery for 24 hours (because of the anesthesia used during the test).    NUTRITION  Drink plenty of fluids.   You may resume your normal diet as instructed by your doctor.   Begin with a light meal and progress to your normal diet. Heavy or fried foods are harder to digest and may make you feel sick to your stomach (nauseated).   Avoid alcoholic beverages for 24 hours or as instructed.    MEDICATIONS  You may resume your normal medications.   WHAT YOU CAN EXPECT  TODAY  Some feelings of bloating in the abdomen.   Passage of more gas than usual.   Spotting of blood in your stool or on the toilet paper  .  IF YOU HAD POLYPS REMOVED DURING THE COLONOSCOPY:  Eat a soft diet IF YOU HAVE NAUSEA, BLOATING, ABDOMINAL PAIN, OR VOMITING.    FINDING OUT THE RESULTS OF YOUR TEST Not all test results are available during your visit. DR. Oneida Alar WILL CALL YOU WITHIN 14 DAYS OF YOUR PROCEDUE WITH YOUR RESULTS. Do not assume everything is normal if you have not heard from DR. Holden Maniscalco, CALL HER OFFICE AT [phone].  SEEK IMMEDIATE MEDICAL ATTENTION AND CALL THE OFFICE: [phone] IF:  You have more than a spotting of blood in your stool.   Your belly is swollen (abdominal distention).   You are nauseated or vomiting.   You have a temperature over 101F.   You have abdominal pain or discomfort that is severe or gets worse throughout the day.   High-Fiber Diet A high-fiber diet changes your normal diet to include more whole grains, legumes, fruits, and vegetables. Changes in the diet involve replacing refined carbohydrates with unrefined foods. The calorie level of the diet is essentially unchanged. The Dietary Reference Intake (recommended amount) for adult males is 38 grams per day. For adult females, it is 25 grams per day. Pregnant and lactating women should consume 28 grams of fiber per day. Fiber is the intact  part of a plant that is not broken down during digestion. Functional fiber is fiber that has been isolated from the plant to provide a beneficial effect in the body. PURPOSE  Increase stool bulk.   Ease and regulate bowel movements.   Lower cholesterol.   REDUCE RISK OF COLON CANCER  INDICATIONS THAT YOU NEED MORE FIBER  Constipation and hemorrhoids.   Uncomplicated diverticulosis (intestine condition) and irritable bowel syndrome.   Weight management.   As a protective measure against hardening of the arteries (atherosclerosis),  diabetes, and cancer.   GUIDELINES FOR INCREASING FIBER IN THE DIET  Start adding fiber to the diet slowly. A gradual increase of about 5 more grams (2 slices of whole-wheat bread, 2 servings of most fruits or vegetables, or 1 bowl of high-fiber cereal) per day is best. Too rapid an increase in fiber may result in constipation, flatulence, and bloating.   Drink enough water and fluids to keep your urine clear or pale yellow. Water, juice, or caffeine-free drinks are recommended. Not drinking enough fluid may cause constipation.   Eat a variety of high-fiber foods rather than one type of fiber.   Try to increase your intake of fiber through using high-fiber foods rather than fiber pills or supplements that contain small amounts of fiber.   The goal is to change the types of food eaten. Do not supplement your present diet with high-fiber foods, but replace foods in your present diet.   INCLUDE A VARIETY OF FIBER SOURCES  Replace refined and processed grains with whole grains, canned fruits with fresh fruits, and incorporate other fiber sources. White rice, white breads, and most bakery goods contain little or no fiber.   Brown whole-grain rice, buckwheat oats, and many fruits and vegetables are all good sources of fiber. These include: broccoli, Brussels sprouts, cabbage, cauliflower, beets, sweet potatoes, white potatoes (skin on), carrots, tomatoes, eggplant, squash, berries, fresh fruits, and dried fruits.   Cereals appear to be the richest source of fiber. Cereal fiber is found in whole grains and bran. Bran is the fiber-rich outer coat of cereal grain, which is largely removed in refining. In whole-grain cereals, the bran remains. In breakfast cereals, the largest amount of fiber is found in those with "bran" in their names. The fiber content is sometimes indicated on the label.   You may need to include additional fruits and vegetables each day.   In baking, for 1 cup white flour, you may  use the following substitutions:   1 cup whole-wheat flour minus 2 tablespoons.   1/2 cup white flour plus 1/2 cup whole-wheat flour.   Polyps, Colon  A polyp is extra tissue that grows inside your body. Colon polyps grow in the large intestine. The large intestine, also called the colon, is part of your digestive system. It is a long, hollow tube at the end of your digestive tract where your body makes and stores stool. Most polyps are not dangerous. They are benign. This means they are not cancerous. But over time, some types of polyps can turn into cancer. Polyps that are smaller than a pea are usually not harmful. But larger polyps could someday become or may already be cancerous. To be safe, doctors remove all polyps and test them.   WHO GETS POLYPS? Anyone can get polyps, but certain people are more likely than others. You may have a greater chance of getting polyps if:  You are over 50.   You have had polyps before.  Someone in your family has had polyps.   Someone in your family has had cancer of the large intestine.   Find out if someone in your family has had polyps. You may also be more likely to get polyps if you:   Eat a lot of fatty foods   Smoke   Drink alcohol   Do not exercise  Eat too much   PREVENTION There is not one sure way to prevent polyps. You might be able to lower your risk of getting them if you:  Eat more fruits and vegetables and less fatty food.   Do not smoke.   Avoid alcohol.   Exercise every day.   Lose weight if you are overweight.   Eating more calcium and folate can also lower your risk of getting polyps. Some foods that are rich in calcium are milk, cheese, and broccoli. Some foods that are rich in folate are chickpeas, kidney beans, and spinach.   Hemorrhoids Hemorrhoids are dilated (enlarged) veins around the rectum. Sometimes clots will form in the veins. This makes them swollen and painful. These are called thrombosed  hemorrhoids. Causes of hemorrhoids include:  Constipation.   Straining to have a bowel movement.   HEAVY LIFTING  HOME CARE INSTRUCTIONS  Eat a well balanced diet and drink 6 to 8 glasses of water every day to avoid constipation. You may also use a bulk laxative.   Avoid straining to have bowel movements.   Keep anal area dry and clean.   Do not use a donut shaped pillow or sit on the toilet for long periods. This increases blood pooling and pain.   Move your bowels when your body has the urge; this will require less straining and will decrease pain and pressure.

## 2017-04-05 NOTE — Anesthesia Procedure Notes (Signed)
Procedure Name: MAC Date/Time: 04/05/2017 10:31 AM Performed by: Andree Elk, Arlester Keehan A Pre-anesthesia Checklist: Patient identified, Emergency Drugs available, Suction available, Patient being monitored and Timeout performed Oxygen Delivery Method: Simple face mask

## 2017-04-05 NOTE — Op Note (Signed)
[facility] Patient Name: [name] Procedure Date: 04/05/2017 10:11 AM MRN: [id number] Date of Birth: March 08, 1951 Attending MD: Barney Drain , MD CSN: [id number] Age: 66 Admit Type: Outpatient Procedure:                Colonoscopy WITH COLD FORCEPS/SNARE AND SNARE                            CAUTERY Indications:              High risk colon cancer surveillance: Personal                            history of colonic polyps Providers:                Barney Drain, MD, Otis Peak B. Sharon Seller, RN, Rosina Lowenstein, RN Referring MD:             Delphina Cahill, MD Medicines:                Propofol per Anesthesia Complications:            No immediate complications. Estimated Blood Loss:     Estimated blood loss was minimal. Procedure:                Pre-Anesthesia Assessment:                           - Prior to the procedure, a History and Physical                            was performed, and patient medications and                            allergies were reviewed. The patient's tolerance of                            previous anesthesia was also reviewed. The risks                            and benefits of the procedure and the sedation                            options and risks were discussed with the patient.                            All questions were answered, and informed consent                            was obtained. Prior Anticoagulants: The patient has                            taken aspirin. ASA Grade Assessment: II - A patient  with mild systemic disease. After reviewing the                            risks and benefits, the patient was deemed in                            satisfactory condition to undergo the procedure.                            After obtaining informed consent, the colonoscope                            was passed under direct vision. Throughout the                            procedure, the  patient's blood pressure, pulse, and                            oxygen saturations were monitored continuously. The                            EC-[id number] ([id number]) scope was introduced through                            the anus and advanced to the the cecum, identified                            by appendiceal orifice and ileocecal valve. The                            colonoscopy was technically difficult and complex                            due to a tortuous colon and RETAINED LIQUID IN                            COLON. Successful completion of the procedure was                            aided by COLOWRAP. The patient tolerated the                            procedure fairly well. The quality of the bowel                            preparation was good. The ileocecal valve,                            appendiceal orifice, and rectum were photographed. Scope In: 10:44:30 AM Scope Out: 11:11:13 AM Scope Withdrawal Time: 0 hours 23 minutes 29 seconds  Total Procedure Duration: 0 hours 26 minutes 43 seconds  Findings:      The recto-sigmoid colon and sigmoid colon were moderately redundant.      Ten  sessile polyps were found in the proximal transverse colon(2),       hepatic flexure(6), proximal ascending colon(1) and cecum(1). The polyps       were 2 to 4 mm in size. These polyps were removed with a cold biopsy       forceps. Resection and retrieval were complete.      Three sessile polyps were found in the hepatic flexure(3-CAUTERY APPLIED       TO ONE POLYPECTOMY SITE DUE TO ACTIVE OOZING(25W). The polyps were 5 to       7 mm in size. These polyps were removed with a cold snare. Resection and       retrieval were complete.      Non-bleeding internal hemorrhoids were found during retroflexion. The       hemorrhoids were moderate. Impression:               - Redundant LEFT colon.                           - 13 COLON POLYPS REMOVED                           - Non-bleeding internal  hemorrhoids. Moderate Sedation:      Per Anesthesia Care Recommendation:           - Repeat colonoscopy in 3 years for surveillance W/                            MAC AND PROPOFOL. ALL FIRSE DEGREE RELATIVES NEED                            COLONOSCOPY AT AGE 72.                           - High fiber diet.                           - Continue present medications. HOLD ASA FOR 5 DAYS.                           - Await pathology results.                           - Patient has a contact number available for                            emergencies. The signs and symptoms of potential                            delayed complications were discussed with the                            patient. Return to normal activities tomorrow.                            Written discharge instructions were provided to the  patient. Procedure Code(s):        --- Professional ---                           [phone], Colonoscopy, flexible; with removal of                            tumor(s), polyp(s), or other lesion(s) by snare                            technique                           45380, 80, Colonoscopy, flexible; with biopsy,                            single or multiple Diagnosis Code(s):        --- Professional ---                           Z86.010, Personal history of colonic polyps                           D12.2, Benign neoplasm of ascending colon                           D12.0, Benign neoplasm of cecum                           D12.3, Benign neoplasm of transverse colon (hepatic                            flexure or splenic flexure)                           K64.8, Other hemorrhoids                           Q43.8, Other specified congenital malformations of                            intestine CPT copyright 2016 [employer]. All rights reserved. The codes documented in this report are preliminary and upon coder review may  be revised to meet current  compliance requirements. Barney Drain, MD Barney Drain, MD 04/05/2017 11:27:24 AM This report has been signed electronically. Number of Addenda: 0

## 2017-04-08 DIAGNOSIS — E782 Mixed hyperlipidemia: Secondary | ICD-10-CM | POA: Diagnosis not present

## 2017-04-08 DIAGNOSIS — I10 Essential (primary) hypertension: Secondary | ICD-10-CM | POA: Diagnosis not present

## 2017-04-08 DIAGNOSIS — R7309 Other abnormal glucose: Secondary | ICD-10-CM | POA: Diagnosis not present

## 2017-04-08 DIAGNOSIS — F5101 Primary insomnia: Secondary | ICD-10-CM | POA: Diagnosis not present

## 2017-04-12 ENCOUNTER — Encounter (HOSPITAL_COMMUNITY): Payer: Self-pay | Admitting: Gastroenterology

## 2017-04-12 DIAGNOSIS — M47817 Spondylosis without myelopathy or radiculopathy, lumbosacral region: Secondary | ICD-10-CM | POA: Diagnosis not present

## 2017-04-12 DIAGNOSIS — M5136 Other intervertebral disc degeneration, lumbar region: Secondary | ICD-10-CM | POA: Diagnosis not present

## 2017-04-12 DIAGNOSIS — G894 Chronic pain syndrome: Secondary | ICD-10-CM | POA: Diagnosis not present

## 2017-04-12 DIAGNOSIS — Z79891 Long term (current) use of opiate analgesic: Secondary | ICD-10-CM | POA: Diagnosis not present

## 2017-04-12 DIAGNOSIS — M5127 Other intervertebral disc displacement, lumbosacral region: Secondary | ICD-10-CM | POA: Diagnosis not present

## 2017-04-12 DIAGNOSIS — Z79899 Other long term (current) drug therapy: Secondary | ICD-10-CM | POA: Diagnosis not present

## 2017-04-15 DIAGNOSIS — I10 Essential (primary) hypertension: Secondary | ICD-10-CM | POA: Diagnosis not present

## 2017-04-15 DIAGNOSIS — E782 Mixed hyperlipidemia: Secondary | ICD-10-CM | POA: Diagnosis not present

## 2017-04-19 DIAGNOSIS — R945 Abnormal results of liver function studies: Secondary | ICD-10-CM | POA: Diagnosis not present

## 2017-04-19 DIAGNOSIS — I10 Essential (primary) hypertension: Secondary | ICD-10-CM | POA: Diagnosis not present

## 2017-04-19 DIAGNOSIS — Z0000 Encounter for general adult medical examination without abnormal findings: Secondary | ICD-10-CM | POA: Diagnosis not present

## 2017-04-26 ENCOUNTER — Encounter: Payer: Self-pay | Admitting: Gastroenterology

## 2017-04-27 ENCOUNTER — Telehealth: Payer: Self-pay | Admitting: Gastroenterology

## 2017-04-27 NOTE — Telephone Encounter (Signed)
Reminder in epic °

## 2017-04-27 NOTE — Telephone Encounter (Signed)
Please call pt. She had 13 simple adenomas removed from her colon.    Cut down and then quit smoking. It is increasing your risk of FORMING COLON POLYPS AND colon cancer.  DRINK WATER TO KEEP YOUR URINE LIGHT YELLOW.  FOLLOW A HIGH FIBER DIET. AVOID ITEMS THAT CAUSE BLOATING & GAS.   Next colonoscopy in 3 years. YOUR SISTERS, BROTHERS, CHILDREN, AND PARENTS NEED TO HAVE A COLONOSCOPY STARTING AT THE AGE OF 40.

## 2017-04-27 NOTE — Telephone Encounter (Signed)
Pt is aware.  

## 2017-05-02 ENCOUNTER — Encounter: Payer: Self-pay | Admitting: Nurse Practitioner

## 2017-05-02 ENCOUNTER — Telehealth: Payer: Self-pay | Admitting: Nurse Practitioner

## 2017-05-02 ENCOUNTER — Ambulatory Visit: Payer: Medicare Other | Admitting: Nurse Practitioner

## 2017-05-02 NOTE — Telephone Encounter (Signed)
PATIENT WAS A NO SHOW AND LETTER SENT  °

## 2017-05-03 NOTE — Telephone Encounter (Signed)
Noted  

## 2017-05-10 DIAGNOSIS — Z79899 Other long term (current) drug therapy: Secondary | ICD-10-CM | POA: Diagnosis not present

## 2017-05-10 DIAGNOSIS — M545 Low back pain: Secondary | ICD-10-CM | POA: Diagnosis not present

## 2017-05-10 DIAGNOSIS — Z79891 Long term (current) use of opiate analgesic: Secondary | ICD-10-CM | POA: Diagnosis not present

## 2017-05-10 DIAGNOSIS — G894 Chronic pain syndrome: Secondary | ICD-10-CM | POA: Diagnosis not present

## 2017-05-10 DIAGNOSIS — M47816 Spondylosis without myelopathy or radiculopathy, lumbar region: Secondary | ICD-10-CM | POA: Diagnosis not present

## 2017-05-25 ENCOUNTER — Ambulatory Visit: Payer: Medicare Other | Admitting: Nurse Practitioner

## 2017-06-06 DIAGNOSIS — M47816 Spondylosis without myelopathy or radiculopathy, lumbar region: Secondary | ICD-10-CM | POA: Diagnosis not present

## 2017-06-06 DIAGNOSIS — G894 Chronic pain syndrome: Secondary | ICD-10-CM | POA: Diagnosis not present

## 2017-06-06 DIAGNOSIS — M545 Low back pain: Secondary | ICD-10-CM | POA: Diagnosis not present

## 2017-06-23 DIAGNOSIS — Z79899 Other long term (current) drug therapy: Secondary | ICD-10-CM | POA: Diagnosis not present

## 2017-06-23 DIAGNOSIS — G894 Chronic pain syndrome: Secondary | ICD-10-CM | POA: Diagnosis not present

## 2017-06-23 DIAGNOSIS — Z79891 Long term (current) use of opiate analgesic: Secondary | ICD-10-CM | POA: Diagnosis not present

## 2017-07-11 ENCOUNTER — Ambulatory Visit: Payer: Medicare Other | Admitting: Nurse Practitioner

## 2017-07-11 ENCOUNTER — Telehealth: Payer: Self-pay | Admitting: Nurse Practitioner

## 2017-07-11 ENCOUNTER — Encounter: Payer: Self-pay | Admitting: Nurse Practitioner

## 2017-07-11 DIAGNOSIS — M47817 Spondylosis without myelopathy or radiculopathy, lumbosacral region: Secondary | ICD-10-CM | POA: Diagnosis not present

## 2017-07-11 NOTE — Telephone Encounter (Signed)
Noted  

## 2017-07-11 NOTE — Telephone Encounter (Signed)
PATIENT WAS A NO SHOW AND LETTER SENT  °

## 2017-07-21 DIAGNOSIS — M47816 Spondylosis without myelopathy or radiculopathy, lumbar region: Secondary | ICD-10-CM | POA: Diagnosis not present

## 2017-07-21 DIAGNOSIS — M545 Low back pain: Secondary | ICD-10-CM | POA: Diagnosis not present

## 2017-07-21 DIAGNOSIS — Z79891 Long term (current) use of opiate analgesic: Secondary | ICD-10-CM | POA: Diagnosis not present

## 2017-07-21 DIAGNOSIS — G894 Chronic pain syndrome: Secondary | ICD-10-CM | POA: Diagnosis not present

## 2017-07-21 DIAGNOSIS — Z79899 Other long term (current) drug therapy: Secondary | ICD-10-CM | POA: Diagnosis not present

## 2017-07-25 DIAGNOSIS — M47817 Spondylosis without myelopathy or radiculopathy, lumbosacral region: Secondary | ICD-10-CM | POA: Diagnosis not present

## 2017-08-15 ENCOUNTER — Telehealth: Payer: Self-pay | Admitting: Gastroenterology

## 2017-08-15 ENCOUNTER — Ambulatory Visit: Payer: Medicare Other | Admitting: Gastroenterology

## 2017-08-15 NOTE — Telephone Encounter (Signed)
Ok to D/C FROM PRACTICE.

## 2017-08-15 NOTE — Telephone Encounter (Signed)
Patient was a no show for the 3rd time

## 2017-08-15 NOTE — Telephone Encounter (Signed)
Routing to Dr. Oneida Alar for approval to discharge from our practice

## 2017-08-16 ENCOUNTER — Encounter: Payer: Self-pay | Admitting: General Practice

## 2017-08-16 NOTE — Telephone Encounter (Signed)
Discharge letter will be mailed  

## 2017-08-18 DIAGNOSIS — Z79899 Other long term (current) drug therapy: Secondary | ICD-10-CM | POA: Diagnosis not present

## 2017-08-18 DIAGNOSIS — M47816 Spondylosis without myelopathy or radiculopathy, lumbar region: Secondary | ICD-10-CM | POA: Diagnosis not present

## 2017-08-18 DIAGNOSIS — M545 Low back pain: Secondary | ICD-10-CM | POA: Diagnosis not present

## 2017-08-18 DIAGNOSIS — Z79891 Long term (current) use of opiate analgesic: Secondary | ICD-10-CM | POA: Diagnosis not present

## 2017-08-18 DIAGNOSIS — G894 Chronic pain syndrome: Secondary | ICD-10-CM | POA: Diagnosis not present

## 2017-09-01 IMAGING — DX DG CHEST 2V
2 series · 2 of 2 positions shown · non-contrast
Comparison: PA and lateral chest x-ray November 25, 2015

CLINICAL DATA: Productive cough with history of pneumonia over the
past week. Patient fell backwards in the bathtub last night and has
had posterior chest wall pain since. History of current smoking,
COPD.

EXAM:
CHEST  2 VIEW

[chest pa]
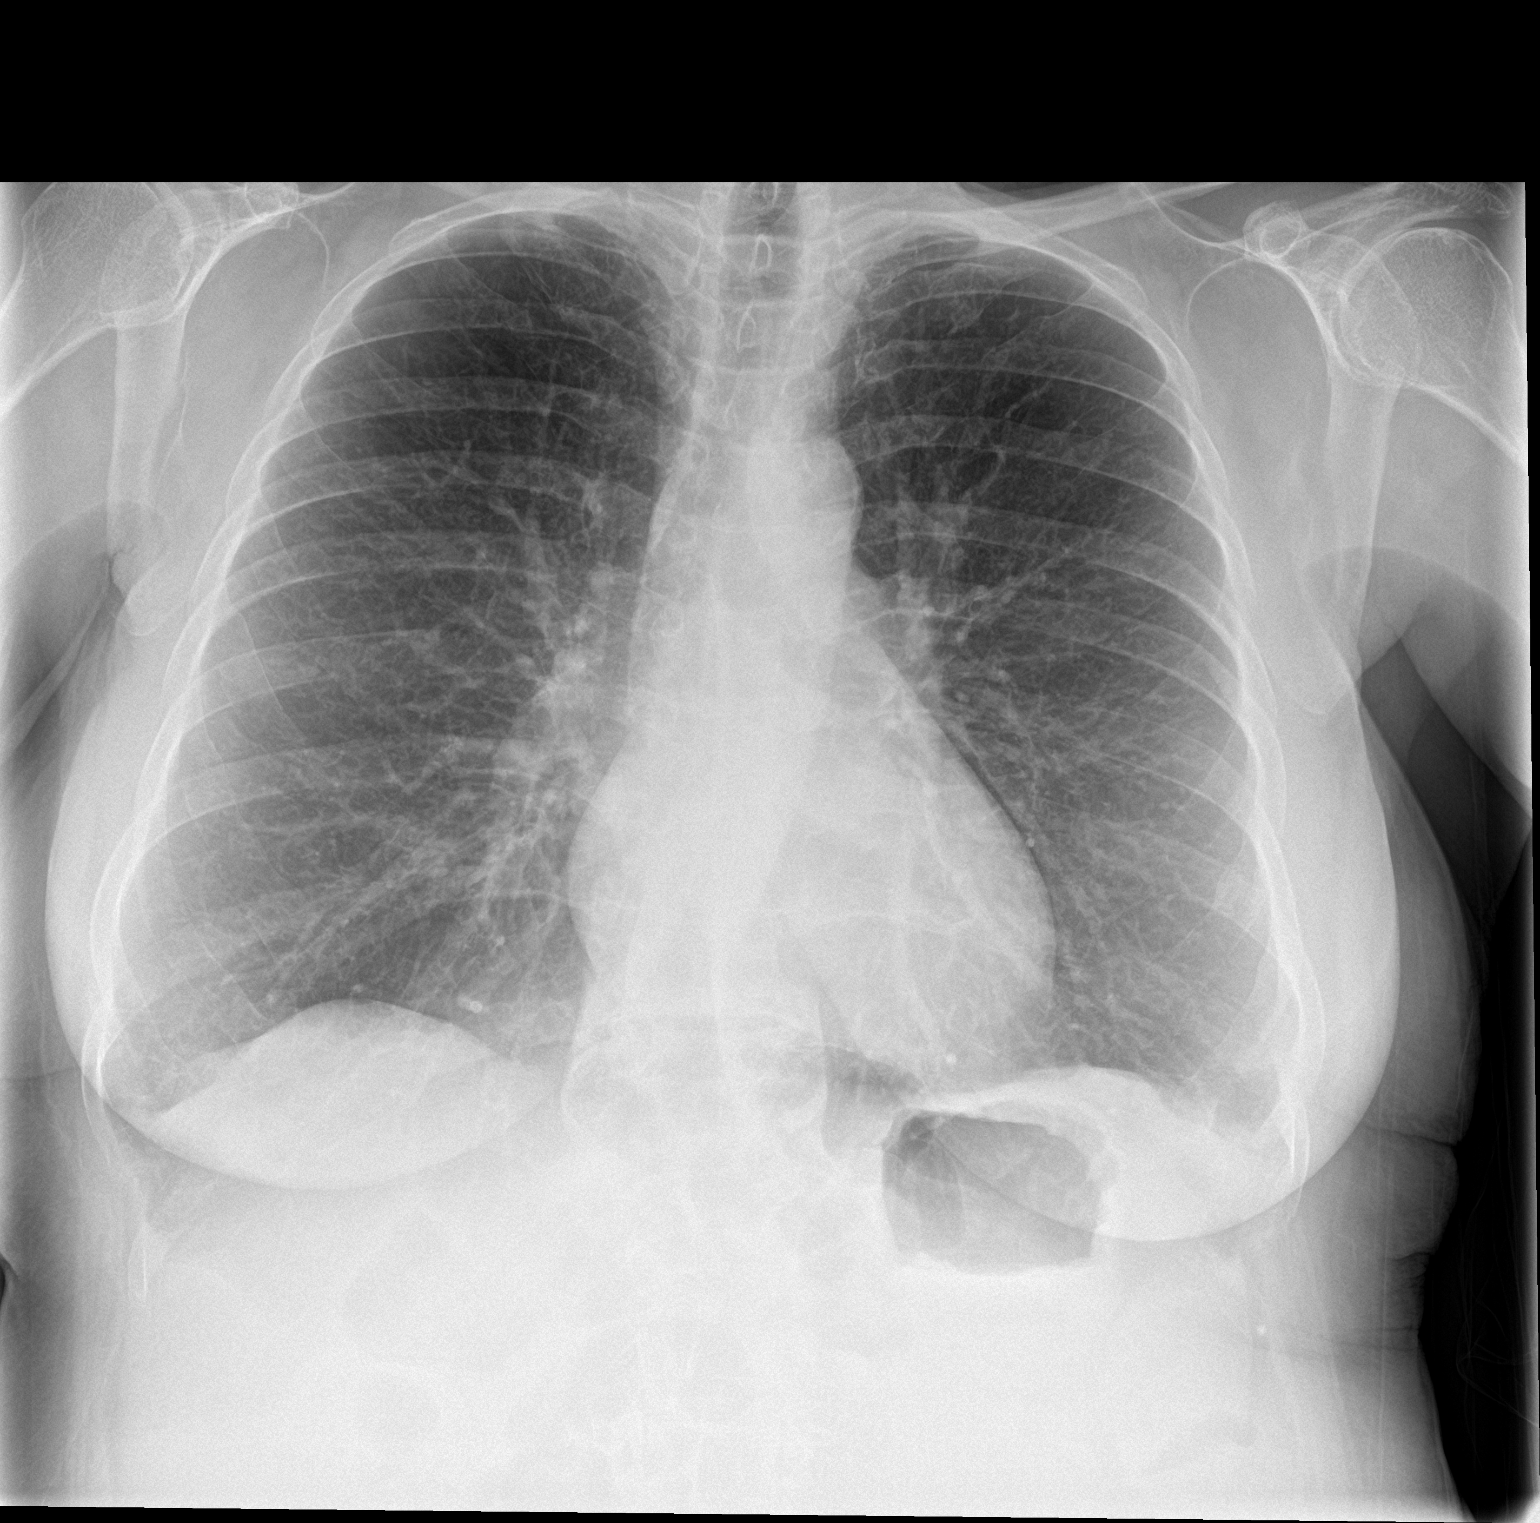

[chest lat]
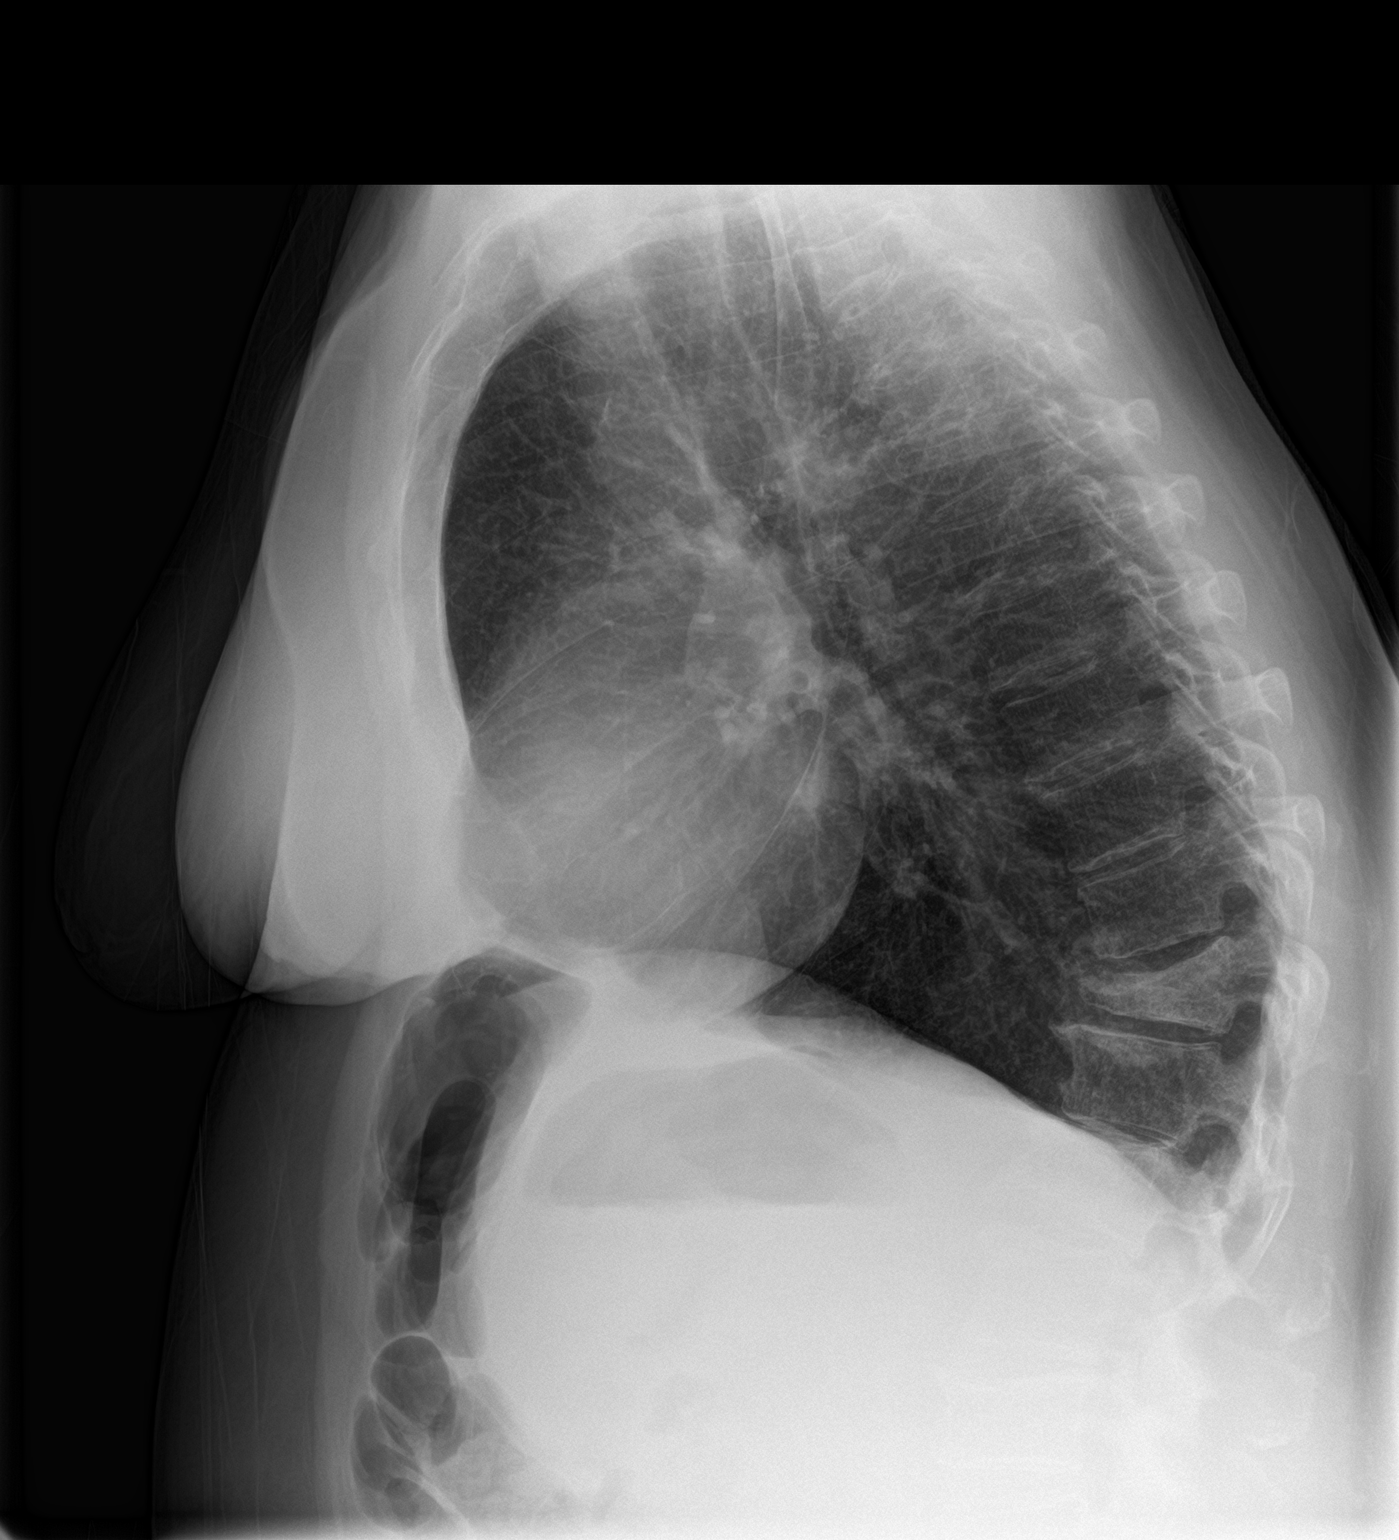

[2 of 2 positions shown; findings below may reference images not displayed]

FINDINGS: The lungs remain hyperinflated with mild hemidiaphragm flattening
and increased AP dimension of the thorax. The heart and pulmonary
vascularity are normal. There is calcification in the wall of the
aortic arch. The mediastinum is normal in width. There is no pleural
effusion. There is stable approximately 70% anterior wedge
compression of T11.
IMPRESSION: COPD. No pneumonia, CHF, nor other acute cardiopulmonary
abnormality. Chronic T11 vertebral compression. The observed ribcage
exhibits no acute abnormality.

## 2017-09-01 IMAGING — CT CT CERVICAL SPINE W/O CM
4 of 8 series · 12 of 33 positions shown, 13 images · non-contrast
Comparison: Head CT December 14, 2015

CLINICAL DATA: Pain following fall 1 week prior

EXAM:
CT HEAD WITHOUT CONTRAST
CT CERVICAL SPINE WITHOUT CONTRAST
TECHNIQUE: Multidetector CT imaging of the head and cervical spine was
performed following the standard protocol without intravenous
contrast. Multiplanar CT image reconstructions of the cervical spine
were also generated.

[Series 4: coronal · coronal · 0.29mm/px · 2 of 65 slices shown]
[im 22/65  bone]
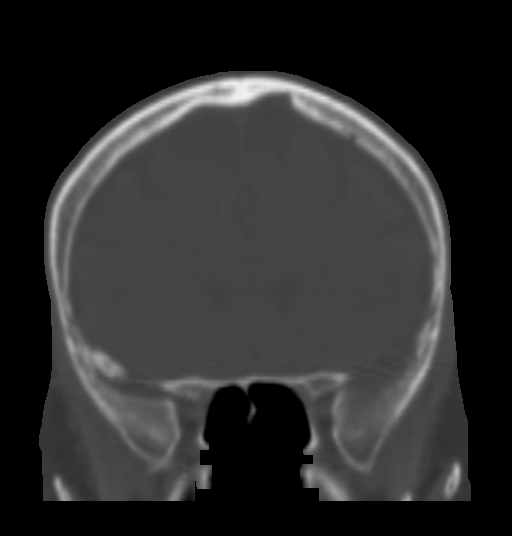
[im 43/65  bone]
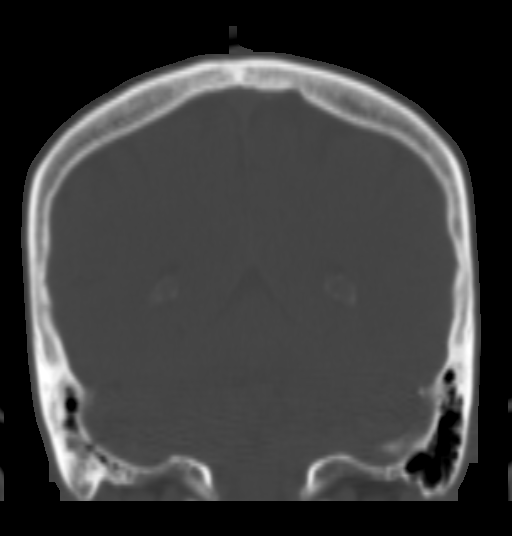

[Series 7: c spine soft · axial · 0.30mm/px · z∈[+1062,+1118]mm · 2 of 85 slices shown]
[im 29/85  soft-tissue]
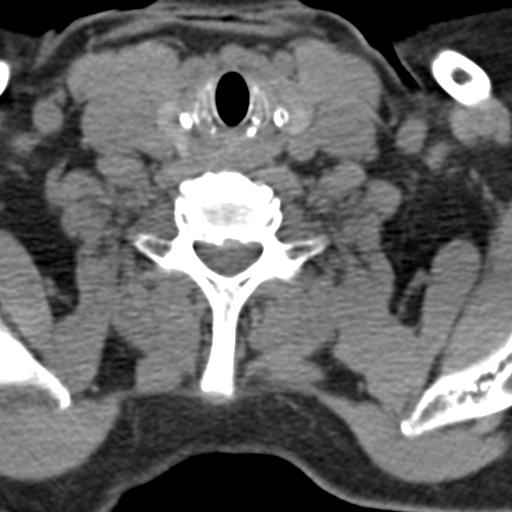
[im 57/85  soft-tissue]
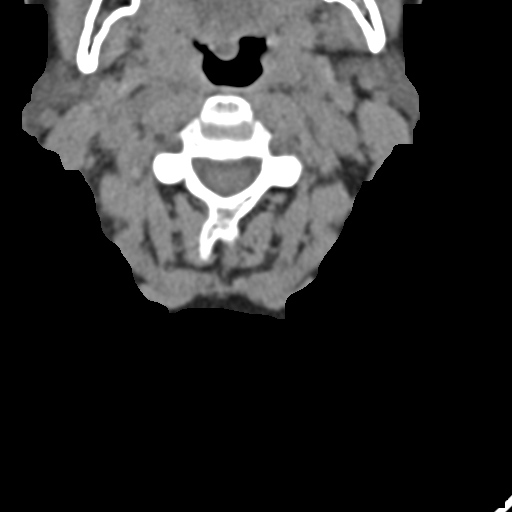

[Series 8: sagittal bone · sagittal · 0.17mm/px · 5 of 59 slices shown]
[im 10/59  bone]
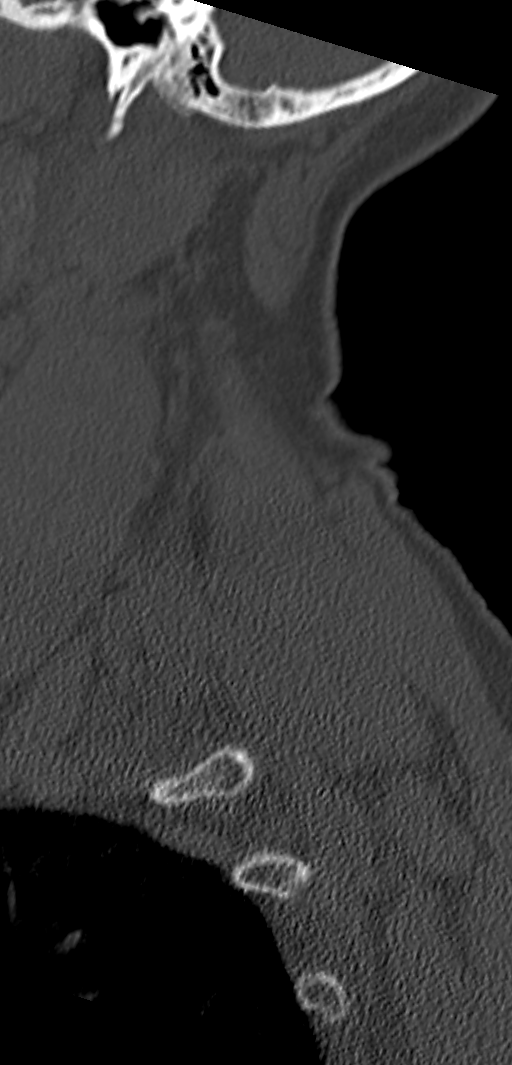
[im 20/59  bone]
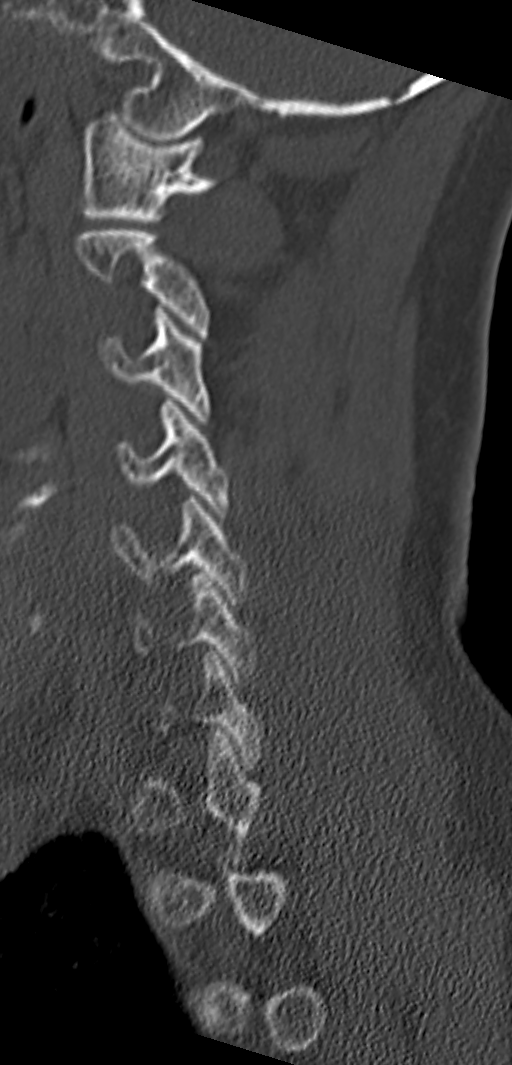
[im 30/59  bone]
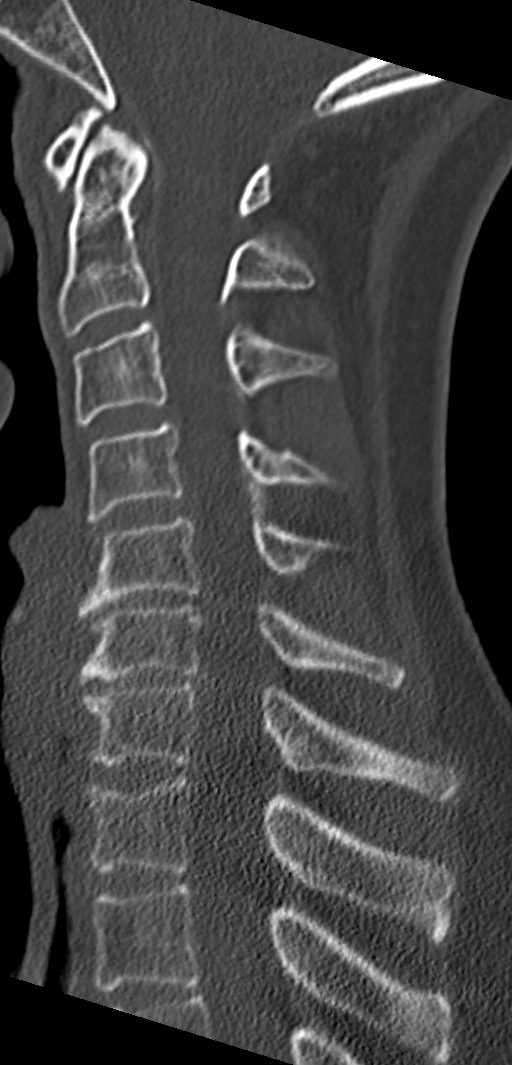
[im 39/59  bone]
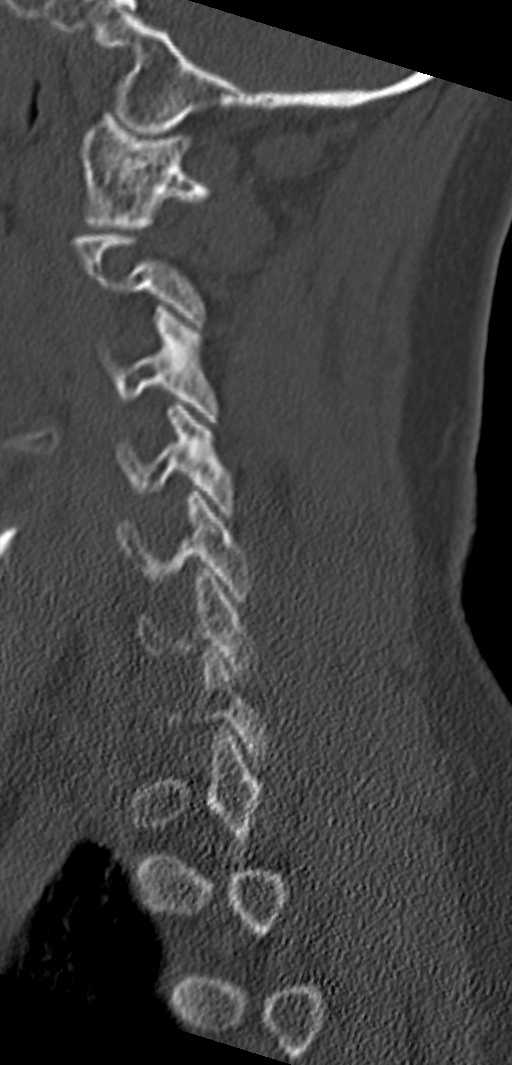
[im 49/59  bone]
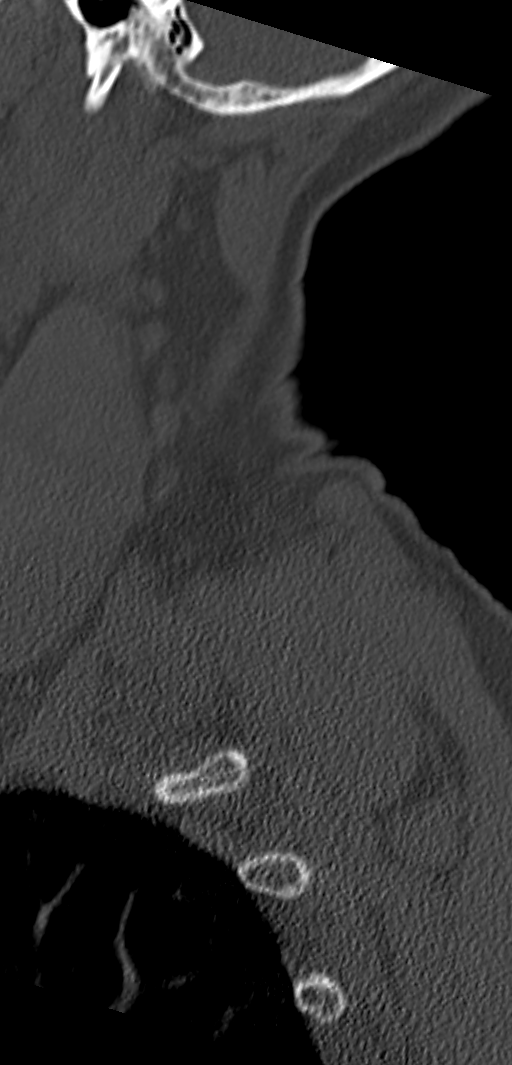

[Series 10: orthogonal axial · axial · 0.18mm/px · z∈[+1026,+1118]mm · 3 of 97 slices shown, 4 images]
[im 25/97  soft-tissue]
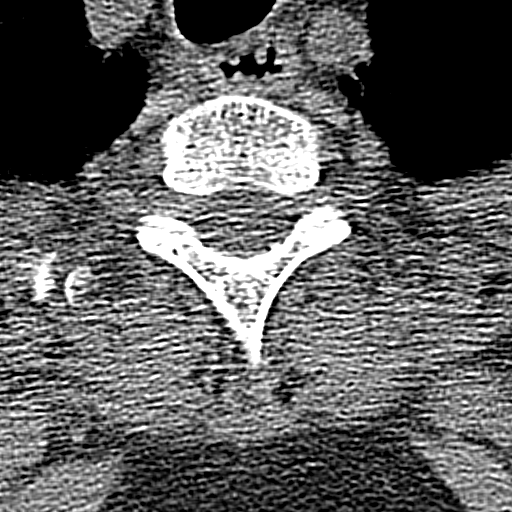
[im 25/97  bone]
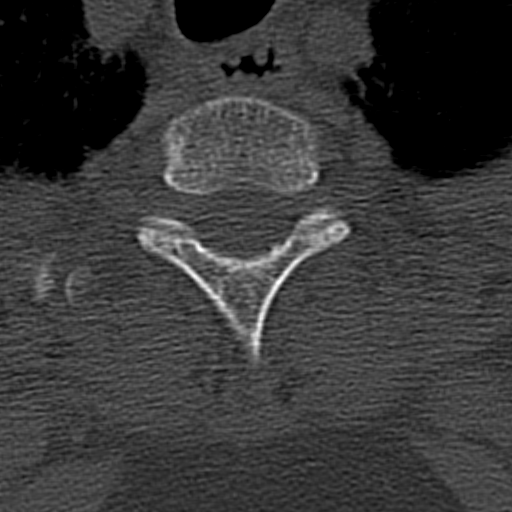
[im 49/97  bone]
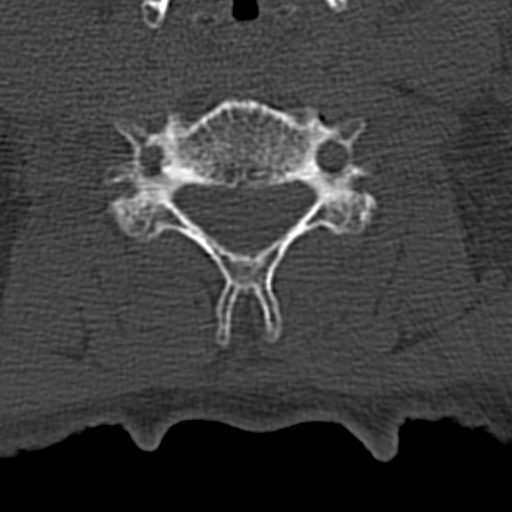
[im 73/97  bone]
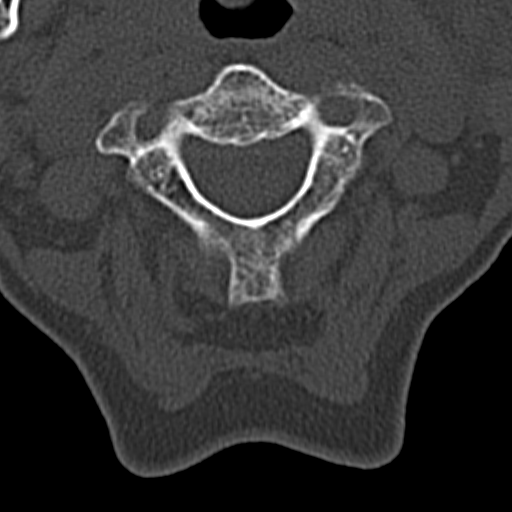

[12 of 33 positions shown; findings below may reference images not displayed]

FINDINGS: CT HEAD FINDINGS

Brain: The ventricles are normal in size and configuration. There is
no intracranial mass, hemorrhage, extra-axial fluid collection, or
midline shift. Gray-white compartments appear normal. No acute
infarct evident.

Vascular: There are no hyperdense vessels. There is calcification in
each carotid siphon region.

Skull: Extradural calcification in the anterior right temporal
region is stable without surrounding edema. The bony calvarium
appears intact.

Sinuses/Orbits: There is an air-fluid level in the left maxillary
antrum. There is mucosal thickening in both maxillary antra as well
as in several anterior ethmoid air cells. Orbits appear symmetric
and unremarkable bilaterally.

Other: There is opacification of several inferior mastoid air cells
on the right. Mastoids elsewhere are clear bilaterally.

CT CERVICAL SPINE FINDINGS

Alignment: There is no appreciable spondylolisthesis.

Skull base and vertebrae: Skull base appears unremarkable. There is
no demonstrable fracture.

Soft tissues and spinal canal: Prevertebral soft tissues and
predental space regions are normal. There is no high-grade spinal
stenosis evident.

Disc levels: There is moderate disc space narrowing at C5-6 and
C6-7. There is slight disc space narrowing at C7-T1. Disc spaces
otherwise appear unremarkable. There is facet hypertrophy at
multiple levels bilaterally. There is no nerve root edema or
effacement.

Upper chest: There is mild bullous disease in the lung apices. Lung
apices otherwise appear clear.

Other: There are foci of carotid artery calcification bilaterally.
IMPRESSION: CT head: No intracranial mass, hemorrhage, or extra-axial fluid
collection. Gray-white compartments are normal. Stable
benign-appearing calcification anteriorly in the right temporal
region, extradural in location. Areas of vascular calcification in
the carotid siphon regions. There is paranasal sinus disease with an
air-fluid level in the left maxillary antrum. There is opacification
of several inferior mastoid air cells on the right. Mastoids
elsewhere clear.

CT cervical spine: No demonstrable fracture or spondylolisthesis.
Osteoarthritic change at multiple levels. There is calcification in
both carotid arteries.

## 2017-09-08 ENCOUNTER — Emergency Department (HOSPITAL_COMMUNITY): Payer: Managed Care, Other (non HMO)

## 2017-09-08 ENCOUNTER — Encounter (HOSPITAL_COMMUNITY): Payer: Self-pay | Admitting: *Deleted

## 2017-09-08 ENCOUNTER — Emergency Department (HOSPITAL_COMMUNITY)
Admission: EM | Admit: 2017-09-08 | Discharge: 2017-09-08 | Disposition: A | Discharge status: Home / Self Care | Payer: Managed Care, Other (non HMO) | Attending: Emergency Medicine | Admitting: Emergency Medicine

## 2017-09-08 DIAGNOSIS — B1710 Acute hepatitis C without hepatic coma: Secondary | ICD-10-CM | POA: Diagnosis not present

## 2017-09-08 DIAGNOSIS — M542 Cervicalgia: Secondary | ICD-10-CM | POA: Diagnosis not present

## 2017-09-08 DIAGNOSIS — F1110 Opioid abuse, uncomplicated: Secondary | ICD-10-CM | POA: Insufficient documentation

## 2017-09-08 DIAGNOSIS — F17210 Nicotine dependence, cigarettes, uncomplicated: Secondary | ICD-10-CM | POA: Insufficient documentation

## 2017-09-08 DIAGNOSIS — I10 Essential (primary) hypertension: Secondary | ICD-10-CM | POA: Insufficient documentation

## 2017-09-08 DIAGNOSIS — M25511 Pain in right shoulder: Secondary | ICD-10-CM | POA: Insufficient documentation

## 2017-09-08 DIAGNOSIS — F419 Anxiety disorder, unspecified: Secondary | ICD-10-CM | POA: Diagnosis not present

## 2017-09-08 DIAGNOSIS — F329 Major depressive disorder, single episode, unspecified: Secondary | ICD-10-CM | POA: Diagnosis not present

## 2017-09-08 DIAGNOSIS — J449 Chronic obstructive pulmonary disease, unspecified: Secondary | ICD-10-CM | POA: Diagnosis not present

## 2017-09-08 MED ORDER — OXYCODONE-ACETAMINOPHEN 5-325 MG PO TABS
1.0000 | ORAL_TABLET | Freq: Once | ORAL | Status: AC
  Administered 2017-09-08: 1 via ORAL
  Filled 2017-09-08: qty 1

## 2017-09-08 NOTE — ED Provider Notes (Signed)
Emergency Department Provider Note   I have reviewed the triage vital signs and the nursing notes.   HISTORY  Chief Complaint Shoulder Pain   HPI [name] is a 66 y.o. female with PMH of COPD, DDD, GERD, and chronic pain followed in pain clinic presents to the emergency department for evaluation of right shoulder pain worsening over the past 2 days. Patient denies any injury. Pain worse with movement. No fevers or chills. No joint warmth or redness. Patient has taken NSAIDs and her Percocet at home with little relief. No radiation of symptoms. Also with some more mild pain in the left shoulder that is chronic.    Past Medical History:  Diagnosis Date  . Anxiety    takes Xanax daily  . Brain tumor (benign) ([place])   . Chronic back pain    bulding disc  . Chronic pain   . COPD (chronic obstructive pulmonary disease) ([place])   . Degenerative disc disease   . Depression    takes Effexor daily  . Disc degeneration   . GERD (gastroesophageal reflux disease)    takes Omeprazole daily  . Headache   . Hepatitis    Hep C  . History of bronchitis    uses inhaler daily  . History of colon polyps   . History of staph infection   . Hyperlipemia    takes Pravastatin daily  . Hypertension    takes Lotrel daily  . Joint pain   . Narcotic overdose 06/13/2015  . Numbness in both hands   . Osteoporosis    Fosamax weekly  . Pinched nerve in shoulder   . Pneumonia 2012  . Substance abuse    benzos, opiates "from non-prescribed sources"    Patient Active Problem List   Diagnosis Date Noted  . History of colonic polyps 03/16/2017  . Taking multiple medications for chronic disease 03/16/2017  . Fracture, stress, metatarsal, right, sequela 01/11/2017  . Acute pain of right shoulder 12/16/2016  . Pain in right foot 12/16/2016  . Fracture of base of fifth metatarsal bone with routine healing 11/29/2016  . Humeral head fracture, right, closed, initial encounter 11/17/2016  .  Acute respiratory failure with hypercapnia ([place]) 06/16/2015  . Acute respiratory acidosis 06/16/2015  . COPD (chronic obstructive pulmonary disease) ([place]) 06/16/2015  . Narcotic overdose 06/15/2015  . Tobacco dependence 05/28/2015  . Acute respiratory failure with hypoxia ([place]) 05/26/2015  . CAP (community acquired pneumonia) 05/25/2015  . Generalized anxiety disorder 01/22/2014    Past Surgical History:  Procedure Laterality Date  . ANKLE FRACTURE SURGERY Bilateral early 2000's  . COLONOSCOPY    . COLONOSCOPY WITH PROPOFOL N/A 04/05/2017   Procedure: COLONOSCOPY WITH PROPOFOL;  Surgeon: Danie Binder, MD;  [facility];  Service: Endoscopy;  Laterality: N/A;  1030-rescheduled 4/17 per Tretha Sciara   . FACIAL FRACTURE SURGERY     in the late 90's  . KNEE SURGERY Right   . ORIF CALCANEOUS FRACTURE Right 07/13/2013   Procedure: RIGHT OPEN REDUCTION INTERNAL FIXATION (ORIF) CALCANEUS FRACTURE;  Surgeon: Newt Minion, MD;  Location: [facility];  Service: Orthopedics;  Laterality: Right;  . POLYPECTOMY  04/05/2017   Procedure: POLYPECTOMY;  Surgeon: Danie Binder, MD;  [facility];  Service: Endoscopy;;  colon  . TUBAL LIGATION      Current Outpatient Rx  . Order #: [id number] Class: Historical Med  . Order #: [id number] Class: Historical Med  . Order #: [id number] Class: Historical Med  .  Order #: [id number] Class: Historical Med  . Order #: [id number] Class: Historical Med  . Order #: [id number] Class: Historical Med  . Order #: [id number] Class: Historical Med  . Order #: [id number] Class: Normal  . Order #: [id number] Class: Historical Med  . Order #: [id number] Class: Normal    Allergies Patient has no known allergies.  Family History  Problem Relation Age of Onset  . Bipolar disorder Daughter   . Drug abuse Son   . Drug abuse Grandchild   . Drug abuse Grandchild   . Diabetes Sister   . Cancer Sister   . Colon cancer Neg Hx     Social History Social History  Substance Use  Topics  . Smoking status: Current Every Day Smoker    Packs/day: 0.50    Years: 40.00    Types: Cigarettes  . Smokeless tobacco: Never Used  . Alcohol use No     Comment: denies    Review of Systems  Constitutional: No fever/chills Eyes: No visual changes. ENT: No sore throat. Cardiovascular: Denies chest pain. Respiratory: Denies shortness of breath. Gastrointestinal: No abdominal pain.  No nausea, no vomiting.  No diarrhea.  No constipation. Genitourinary: Negative for dysuria. Musculoskeletal: Negative for back pain. Positive for right > left shoulder pain (acute on chronic).  Skin: Negative for rash. Neurological: Negative for headaches, focal weakness or numbness.  10-point ROS otherwise negative.  ____________________________________________   PHYSICAL EXAM:  VITAL SIGNS: ED Triage Vitals  Enc Vitals Group     BP 09/08/17 1906 123/69     Pulse Rate 09/08/17 1906 79     Resp 09/08/17 1906 18     Temp 09/08/17 1906 98.5 F (36.9 C)     Temp Source 09/08/17 1906 Oral     SpO2 09/08/17 1906 95 %     Weight 09/08/17 1908 150 lb (68 kg)     Height 09/08/17 1906 5\' 2"  (1.575 m)     Pain Score 09/08/17 1906 10   Constitutional: Alert and oriented. Well appearing and in no acute distress. Eyes: Conjunctivae are normal.  Head: Atraumatic. Nose: No congestion/rhinnorhea. Mouth/Throat: Mucous membranes are moist.  Neck: No stridor.   Cardiovascular: Good peripheral circulation.  Respiratory: Normal respiratory effort.  Gastrointestinal: No distention.  Musculoskeletal: No lower extremity tenderness nor edema. No gross deformities of extremities. Full passive ROM of the right shoulder. Active ROM limited beyond 90 degrees of abduction.  Neurologic:  Normal speech and language. No gross focal neurologic deficits are appreciated.  Skin:  Skin is warm, dry and intact. No rash noted.  ____________________________________________  RADIOLOGY  Dg Cervical Spine  Complete  Result Date: 09/08/2017 CLINICAL DATA:  Patient states pain to the superior right shoulder and right sided neck since having a "hairline break in my arm 1.5 years ago". The pain has become unbearably worse for the past couple of days. EXAM: CERVICAL SPINE - COMPLETE 4+ VIEW COMPARISON:  CT cervical spine dated 08/25/2016. FINDINGS: Mild degenerative change within the lower cervical spine, with slight disc space narrowings and associated osseous spurring. Minimal retrolisthesis of C5 is likely related to the underlying degenerative change. Straightening of the normal cervical lordosis is also likely related to these underlying degenerative changes. No acute or suspicious osseous finding. No fracture line or displaced fracture fragment. No more than mild osseous neural foramen narrowing appreciated at any level on the oblique views. Prevertebral soft tissues are normal in thickness. Carotid atherosclerosis noted bilaterally. IMPRESSION: 1. No acute findings. 2. Mild degenerative change within the lower  cervical spine, as detailed above. 3. Carotid atherosclerosis. Electronically Signed   By: Franki Cabot M.D.   On: 09/08/2017 20:41   Dg Shoulder Right  Result Date: 09/08/2017 CLINICAL DATA:  Patient states pain to the superior right shoulder and right sided neck since having a "hairline break in my arm 1.5 years ago". The pain has become unbearably worse for the past couple of days. EXAM: RIGHT SHOULDER - 2+ VIEW COMPARISON:  Plain film of the right shoulder dated 11/15/2016 FINDINGS: Osseous alignment is normal. Interval healed fracture at the lateral margin of the right humeral head. No acute appearing fracture. No acute or suspicious osseous lesion. Faint linear calcification within the acromiohumeral space, possibly old avulsion fracture fragment or sequela of either rotator cuff tear or calcific tendinopathy. Soft tissues about the right shoulder otherwise unremarkable. IMPRESSION: 1. No acute  findings.  No osseous fracture or dislocation. 2. Old healed fracture within the lateral aspects of the right humeral head. 3. Focal calcification just above the right humeral head (acromiohumeral space), old avulsion fracture fragment versus sequela of rotator cuff tear or chronic calcific tendinopathy. Nonemergent shoulder MRI would be helpful for further characterization of the rotator cuff complex, if clinically indicated. Electronically Signed   By: Franki Cabot M.D.   On: 09/08/2017 20:39    ____________________________________________   PROCEDURES  Procedure(s) performed:   Procedures  None  ____________________________________________   INITIAL IMPRESSION / ASSESSMENT AND PLAN / ED COURSE  Pertinent labs & imaging results that were available during my care of the patient were reviewed by me and considered in my medical decision making (see chart for details).  Patient presents to the ED for evaluation of right shoulder pain that is acute on chronic. No recent injury. No evidence of septic joint. Normal plain film of the shoulder. Cervical spine x-ray ordered from triage is also with no acute findings. No tenderness to palpation of the cervical spine. Possible rotator cuff strain. Advised NSAIDs and active ROM exercises. Will follow with PCP and Orthopedics as needed for evaluation of rotator cuff injury. Patient in pain mgmt clinic so no pain medication provided at discharge.   At this time, I do not feel there is any life-threatening condition present. I have reviewed and discussed all results (EKG, imaging, lab, urine as appropriate), exam findings with patient. I have reviewed nursing notes and appropriate previous records.  I feel the patient is safe to be discharged home without further emergent workup. Discussed usual and customary return precautions. Patient and family (if present) verbalize understanding and are comfortable with this plan.  Patient will follow-up with their  primary care provider. If they do not have a primary care provider, information for follow-up has been provided to them. All questions have been answered.   ____________________________________________  FINAL CLINICAL IMPRESSION(S) / ED DIAGNOSES  Final diagnoses:  Acute pain of right shoulder     MEDICATIONS GIVEN DURING THIS VISIT:  Medications  oxyCODONE-acetaminophen (PERCOCET/ROXICET) 5-325 MG per tablet 1 tablet (1 tablet Oral Given 09/08/17 2053)     NEW OUTPATIENT MEDICATIONS STARTED DURING THIS VISIT:  None   Note:  This document was prepared using Dragon voice recognition software and may include unintentional dictation errors.  Nanda Quinton, MD Emergency Medicine    Roniqua Kintz, [facility], MD 09/09/17 1059

## 2017-09-08 NOTE — ED Triage Notes (Signed)
Pt has right shoulder pain starting 4 days ago. Denies any injury. Pain worsens when she moves her arm. NAD noted

## 2017-09-08 NOTE — Discharge Instructions (Signed)

## 2017-09-09 ENCOUNTER — Other Ambulatory Visit: Payer: Self-pay

## 2017-09-15 DIAGNOSIS — G894 Chronic pain syndrome: Secondary | ICD-10-CM | POA: Diagnosis not present

## 2017-09-15 DIAGNOSIS — Z79899 Other long term (current) drug therapy: Secondary | ICD-10-CM | POA: Diagnosis not present

## 2017-09-15 DIAGNOSIS — Z79891 Long term (current) use of opiate analgesic: Secondary | ICD-10-CM | POA: Diagnosis not present

## 2017-09-15 DIAGNOSIS — M545 Low back pain: Secondary | ICD-10-CM | POA: Diagnosis not present

## 2017-09-15 DIAGNOSIS — M47816 Spondylosis without myelopathy or radiculopathy, lumbar region: Secondary | ICD-10-CM | POA: Diagnosis not present

## 2017-09-29 ENCOUNTER — Ambulatory Visit (INDEPENDENT_AMBULATORY_CARE_PROVIDER_SITE_OTHER): Payer: 59 | Admitting: Orthopedic Surgery

## 2017-09-29 ENCOUNTER — Encounter (INDEPENDENT_AMBULATORY_CARE_PROVIDER_SITE_OTHER): Payer: Self-pay | Admitting: Orthopedic Surgery

## 2017-09-29 DIAGNOSIS — M7541 Impingement syndrome of right shoulder: Secondary | ICD-10-CM

## 2017-09-29 MED ORDER — METHYLPREDNISOLONE ACETATE 40 MG/ML IJ SUSP
40.0000 mg | INTRAMUSCULAR | Status: AC | PRN
  Administered 2017-09-29: 40 mg via INTRA_ARTICULAR

## 2017-09-29 MED ORDER — LIDOCAINE HCL 1 % IJ SOLN
5.0000 mL | INTRAMUSCULAR | Status: AC | PRN
  Administered 2017-09-29: 5 mL

## 2017-09-29 NOTE — Progress Notes (Signed)
Office Visit Note   Patient: [name]           Date of Birth: [birth year]           MRN: [id number] Visit Date: 09/29/2017              Requested by: Celene Squibb, MD [address] [place], [place] [address] PCP: Celene Squibb, MD  Chief Complaint  Patient presents with  . Right Shoulder - Pain  . Left Shoulder - Pain      HPI: Patient is a 66 year old woman presents with chronic right shoulder pain. She states she went to [facility] emergency room on 09/08/2017 radiographs were obtained of the shoulder and neck. Patient states that her pain started worsening about 2 weeks ago she denies any trauma she states she has decreased range of motion of the shoulder worse on the right and left can't raise her arm above her head. Patient complains of pain anteriorly over the biceps. She denies any radicular symptoms.  Assessment & Plan: Visit Diagnoses:  1. Impingement syndrome of right shoulder     Plan: Patient underwent subacromial injection. Plan to follow-up in 3 weeks. If were not showing improvement will obtain an MRI scan.  Follow-Up Instructions: Return in about 3 weeks (around 10/20/2017).   Ortho Exam  Patient is alert, oriented, no adenopathy, well-dressed, normal affect, normal respiratory effort. Examination patient has a normal gait. Review of her outside radiographs shows a fleck of bone in the rotator cuff possible from an evulsion injury. The glenohumeral joint is congruent. Patient has pain with Neer and Hawkins impingement test pain with drop arm test. Passively she has abduction flexion to 90 internal and external rotation of 45. No adhesive capsulitis.  Imaging: No results found. No images are attached to the encounter.  Labs: Lab Results  Component Value Date   REPTSTATUS 08/01/2015 FINAL 07/29/2015   CULT  07/29/2015    50,000 COLONIES/mL ESCHERICHIA COLI Performed at [facility] 07/29/2015    Orders:    No orders of the defined types were placed in this encounter.  No orders of the defined types were placed in this encounter.    Procedures: Large Joint Inj Date/Time: 09/29/2017 8:24 AM Performed by: Sterling Mondo V Authorized by: Newt Minion   Consent Given by:  Patient Site marked: the procedure site was marked   Timeout: prior to procedure the correct patient, procedure, and site was verified   Indications:  Pain and diagnostic evaluation Location:  Shoulder Site:  R subacromial bursa Prep: patient was prepped and draped in usual sterile fashion   Needle Size:  22 G Needle Length:  1.5 inches Approach:  Posterior Ultrasound Guidance: No   Fluoroscopic Guidance: No   Arthrogram: No   Medications:  5 mL lidocaine 1 %; 40 mg methylPREDNISolone acetate 40 MG/ML Aspiration Attempted: No   Patient tolerance:  Patient tolerated the procedure well with no immediate complications    Clinical Data: No additional findings.  ROS:  All other systems negative, except as noted in the HPI. Review of Systems  Objective: Vital Signs: There were no vitals taken for this visit.  Specialty Comments:  No specialty comments available.  PMFS History: Patient Active Problem List   Diagnosis Date Noted  . Impingement syndrome of right shoulder 09/29/2017  . History of colonic polyps 03/16/2017  . Taking multiple medications for chronic disease 03/16/2017  . Fracture, stress,  metatarsal, right, sequela 01/11/2017  . Acute pain of right shoulder 12/16/2016  . Pain in right foot 12/16/2016  . Fracture of base of fifth metatarsal bone with routine healing 11/29/2016  . Humeral head fracture, right, closed, initial encounter 11/17/2016  . Acute respiratory failure with hypercapnia ([place]) 06/16/2015  . Acute respiratory acidosis 06/16/2015  . COPD (chronic obstructive pulmonary disease) ([place]) 06/16/2015  . Narcotic overdose ([place]) 06/15/2015  . Tobacco dependence 05/28/2015  . Acute  respiratory failure with hypoxia ([place]) 05/26/2015  . CAP (community acquired pneumonia) 05/25/2015  . Generalized anxiety disorder 01/22/2014   Past Medical History:  Diagnosis Date  . Anxiety    takes Xanax daily  . Brain tumor (benign) ([place])   . Chronic back pain    bulding disc  . Chronic pain   . COPD (chronic obstructive pulmonary disease) ([place])   . Degenerative disc disease   . Depression    takes Effexor daily  . Disc degeneration   . GERD (gastroesophageal reflux disease)    takes Omeprazole daily  . Headache   . Hepatitis    Hep C  . History of bronchitis    uses inhaler daily  . History of colon polyps   . History of staph infection   . Hyperlipemia    takes Pravastatin daily  . Hypertension    takes Lotrel daily  . Joint pain   . Narcotic overdose ([place]) 06/13/2015  . Numbness in both hands   . Osteoporosis    Fosamax weekly  . Pinched nerve in shoulder   . Pneumonia 2012  . Substance abuse ([place])    benzos, opiates "from non-prescribed sources"    Family History  Problem Relation Age of Onset  . Bipolar disorder Daughter   . Drug abuse Son   . Drug abuse Grandchild   . Drug abuse Grandchild   . Diabetes Sister   . Cancer Sister   . Colon cancer Neg Hx     Past Surgical History:  Procedure Laterality Date  . ANKLE FRACTURE SURGERY Bilateral early 2000's  . COLONOSCOPY    . COLONOSCOPY WITH PROPOFOL N/A 04/05/2017   Procedure: COLONOSCOPY WITH PROPOFOL;  Surgeon: Danie Binder, MD;  [facility];  Service: Endoscopy;  Laterality: N/A;  1030-rescheduled 4/17 per Tretha Sciara   . FACIAL FRACTURE SURGERY     in the late 90's  . KNEE SURGERY Right   . ORIF CALCANEOUS FRACTURE Right 07/13/2013   Procedure: RIGHT OPEN REDUCTION INTERNAL FIXATION (ORIF) CALCANEUS FRACTURE;  Surgeon: Newt Minion, MD;  Location: [facility];  Service: Orthopedics;  Laterality: Right;  . POLYPECTOMY  04/05/2017   Procedure: POLYPECTOMY;  Surgeon: Danie Binder, MD;   [facility];  Service: Endoscopy;;  colon  . TUBAL LIGATION     Social History   Occupational History  . Not on file.   Social History Main Topics  . Smoking status: Current Every Day Smoker    Packs/day: 0.50    Years: 40.00    Types: Cigarettes  . Smokeless tobacco: Never Used  . Alcohol use No     Comment: denies  . Drug use: No     Comment: Previously used marijuana and cocaine (last use about 25 years ago)  . Sexual activity: Yes    Birth control/ protection: Post-menopausal

## 2017-09-30 ENCOUNTER — Emergency Department (HOSPITAL_COMMUNITY)
Admission: EM | Admit: 2017-09-30 | Discharge: 2017-09-30 | Disposition: A | Discharge status: Home / Self Care | Payer: Managed Care, Other (non HMO) | Attending: Emergency Medicine | Admitting: Emergency Medicine

## 2017-09-30 ENCOUNTER — Encounter (HOSPITAL_COMMUNITY): Payer: Self-pay | Admitting: Emergency Medicine

## 2017-09-30 ENCOUNTER — Emergency Department (HOSPITAL_COMMUNITY): Payer: Managed Care, Other (non HMO)

## 2017-09-30 DIAGNOSIS — I10 Essential (primary) hypertension: Secondary | ICD-10-CM | POA: Insufficient documentation

## 2017-09-30 DIAGNOSIS — J40 Bronchitis, not specified as acute or chronic: Secondary | ICD-10-CM | POA: Diagnosis not present

## 2017-09-30 DIAGNOSIS — Z79899 Other long term (current) drug therapy: Secondary | ICD-10-CM | POA: Insufficient documentation

## 2017-09-30 DIAGNOSIS — F17210 Nicotine dependence, cigarettes, uncomplicated: Secondary | ICD-10-CM | POA: Diagnosis not present

## 2017-09-30 DIAGNOSIS — J449 Chronic obstructive pulmonary disease, unspecified: Secondary | ICD-10-CM | POA: Diagnosis not present

## 2017-09-30 DIAGNOSIS — Z7902 Long term (current) use of antithrombotics/antiplatelets: Secondary | ICD-10-CM | POA: Insufficient documentation

## 2017-09-30 DIAGNOSIS — R05 Cough: Secondary | ICD-10-CM | POA: Diagnosis not present

## 2017-09-30 MED ORDER — PREDNISONE 20 MG PO TABS: 40.0000 mg | ORAL_TABLET | Freq: Every day | ORAL | 0 refills | Status: DC

## 2017-09-30 MED ORDER — DOXYCYCLINE HYCLATE 100 MG PO CAPS: 100.0000 mg | ORAL_CAPSULE | Freq: Two times a day (BID) | ORAL | 0 refills | Status: AC

## 2017-09-30 MED ORDER — PREDNISONE 20 MG PO TABS
40.0000 mg | ORAL_TABLET | Freq: Once | ORAL | Status: AC
  Administered 2017-09-30: 40 mg via ORAL
  Filled 2017-09-30: qty 2

## 2017-09-30 MED ORDER — ALBUTEROL SULFATE (2.5 MG/3ML) 0.083% IN NEBU
2.5000 mg | INHALATION_SOLUTION | Freq: Once | RESPIRATORY_TRACT | Status: AC
  Administered 2017-09-30: 2.5 mg via RESPIRATORY_TRACT
  Filled 2017-09-30: qty 3

## 2017-09-30 MED ORDER — IPRATROPIUM-ALBUTEROL 0.5-2.5 (3) MG/3ML IN SOLN
3.0000 mL | Freq: Once | RESPIRATORY_TRACT | Status: AC
  Administered 2017-09-30: 3 mL via RESPIRATORY_TRACT
  Filled 2017-09-30: qty 3

## 2017-09-30 MED ORDER — ALBUTEROL SULFATE (2.5 MG/3ML) 0.083% IN NEBU: 2.5000 mg | INHALATION_SOLUTION | Freq: Four times a day (QID) | RESPIRATORY_TRACT | 0 refills | Status: AC | PRN

## 2017-09-30 NOTE — Discharge Instructions (Signed)
Continue to use your albuterol nebulizer treatment every 4 hrs.  As needed.  Follow-up with your doctor for recheck.  Return here if needed,.

## 2017-09-30 NOTE — ED Triage Notes (Signed)
Pt c/o cough/congestion x 3 days. Reports productive cough with yellow sputum. nad noted.

## 2017-09-30 NOTE — ED Provider Notes (Signed)
[facility] DEPT Provider Note   CSN: [id number] Arrival date & time: 09/30/17  1329     History   Chief Complaint Chief Complaint  Patient presents with  . Cough    HPI [name] is a 66 y.o. female.  HPI  [name] is a 66 y.o. female who presents to the Emergency Department complaining of cough, wheezing and chest tightness.  Symptoms present for 3 days.  She has been using OTC Nyquil without relief.  Cough has been productive of yellow sputum.  She has been using an albuterol inhaler, but states her albuterol vials for her nebulizer has ran out.  She denies chest pain, fever,shortness of breath, and sore throat   Past Medical History:  Diagnosis Date  . Anxiety    takes Xanax daily  . Brain tumor (benign) ([place])   . Chronic back pain    bulding disc  . Chronic pain   . COPD (chronic obstructive pulmonary disease) ([place])   . Degenerative disc disease   . Depression    takes Effexor daily  . Disc degeneration   . GERD (gastroesophageal reflux disease)    takes Omeprazole daily  . Headache   . Hepatitis    Hep C  . History of bronchitis    uses inhaler daily  . History of colon polyps   . History of staph infection   . Hyperlipemia    takes Pravastatin daily  . Hypertension    takes Lotrel daily  . Joint pain   . Narcotic overdose ([place]) 06/13/2015  . Numbness in both hands   . Osteoporosis    Fosamax weekly  . Pinched nerve in shoulder   . Pneumonia 2012  . Substance abuse ([place])    benzos, opiates "from non-prescribed sources"    Patient Active Problem List   Diagnosis Date Noted  . Impingement syndrome of right shoulder 09/29/2017  . History of colonic polyps 03/16/2017  . Taking multiple medications for chronic disease 03/16/2017  . Fracture, stress, metatarsal, right, sequela 01/11/2017  . Acute pain of right shoulder 12/16/2016  . Pain in right foot 12/16/2016  . Fracture of base of fifth metatarsal bone with routine healing  11/29/2016  . Humeral head fracture, right, closed, initial encounter 11/17/2016  . Acute respiratory failure with hypercapnia ([place]) 06/16/2015  . Acute respiratory acidosis 06/16/2015  . COPD (chronic obstructive pulmonary disease) ([place]) 06/16/2015  . Narcotic overdose ([place]) 06/15/2015  . Tobacco dependence 05/28/2015  . Acute respiratory failure with hypoxia ([place]) 05/26/2015  . CAP (community acquired pneumonia) 05/25/2015  . Generalized anxiety disorder 01/22/2014    Past Surgical History:  Procedure Laterality Date  . ANKLE FRACTURE SURGERY Bilateral early 2000's  . COLONOSCOPY    . COLONOSCOPY WITH PROPOFOL N/A 04/05/2017   Procedure: COLONOSCOPY WITH PROPOFOL;  Surgeon: Danie Binder, MD;  [facility];  Service: Endoscopy;  Laterality: N/A;  1030-rescheduled 4/17 per Tretha Sciara   . FACIAL FRACTURE SURGERY     in the late 90's  . KNEE SURGERY Right   . ORIF CALCANEOUS FRACTURE Right 07/13/2013   Procedure: RIGHT OPEN REDUCTION INTERNAL FIXATION (ORIF) CALCANEUS FRACTURE;  Surgeon: Newt Minion, MD;  Location: [facility];  Service: Orthopedics;  Laterality: Right;  . POLYPECTOMY  04/05/2017   Procedure: POLYPECTOMY;  Surgeon: Danie Binder, MD;  [facility];  Service: Endoscopy;;  colon  . TUBAL LIGATION      OB History    Gravida Para  Term Preterm AB Living   3 2 2   1 2    SAB TAB Ectopic Multiple Live Births   1               Home Medications    Prior to Admission medications   Medication Sig Start Date End Date Taking? Authorizing Provider  albuterol (PROVENTIL) (2.5 MG/3ML) 0.083% nebulizer solution Take 3 mLs (2.5 mg total) by nebulization every 6 (six) hours as needed for wheezing or shortness of breath. 09/30/17   Alainah Phang, PA-C  alendronate (FOSAMAX) 70 MG tablet Take 70 mg by mouth every 7 (seven) days. On Mondays. Take with a full glass of water on an empty stomach.    [provider]  amLODipine-benazepril (LOTREL) 5-10 MG per  capsule Take 1 capsule by mouth daily.    [provider]  doxycycline (VIBRAMYCIN) 100 MG capsule Take 1 capsule (100 mg total) by mouth 2 (two) times daily. 09/30/17   Amedio Bowlby, PA-C  gabapentin (NEURONTIN) 800 MG tablet Take 800 mg by mouth 4 (four) times daily.    [provider]  Eloise Levels, Camillia sinensis, (GREEN TEA PO) Take 1 tablet by mouth daily.    [provider]  oxyCODONE-acetaminophen (PERCOCET) 7.5-325 MG tablet Take 1 tablet by mouth every 6 (six) hours as needed (pain).  02/14/17   [provider]  pravastatin (PRAVACHOL) 20 MG tablet Take 20 mg by mouth daily.    [provider]  predniSONE (DELTASONE) 20 MG tablet Take 2 tablets (40 mg total) by mouth daily. 09/30/17   Sterling Mondo, PA-C  tiotropium (SPIRIVA HANDIHALER) 18 MCG inhalation capsule Place 1 capsule (18 mcg total) into inhaler and inhale daily. Pharmacist please instruct in proper use. 05/29/15   Samuella Cota, MD  traZODone (DESYREL) 150 MG tablet Take 150 mg by mouth at bedtime. 04/05/16   [provider]  venlafaxine XR (EFFEXOR-XR) 150 MG 24 hr capsule Take 2 capsules (300 mg total) by mouth daily. 10/08/15   Cloria Spring, MD    Family History Family History  Problem Relation Age of Onset  . Bipolar disorder Daughter   . Drug abuse Son   . Drug abuse Grandchild   . Drug abuse Grandchild   . Diabetes Sister   . Cancer Sister   . Colon cancer Neg Hx     Social History Social History  Substance Use Topics  . Smoking status: Current Every Day Smoker    Packs/day: 0.50    Years: 40.00    Types: Cigarettes  . Smokeless tobacco: Never Used  . Alcohol use No     Comment: denies     Allergies   Patient has no known allergies.   Review of Systems Review of Systems  Constitutional: Negative for appetite change, chills and fever.  HENT: Positive for congestion. Negative for sore throat and trouble swallowing.   Respiratory:  Positive for cough, chest tightness and wheezing. Negative for shortness of breath.   Cardiovascular: Negative for chest pain.  Gastrointestinal: Negative for abdominal pain, nausea and vomiting.  Genitourinary: Negative for dysuria.  Musculoskeletal: Negative for arthralgias and myalgias.  Skin: Negative for rash.  Neurological: Negative for dizziness, weakness and numbness.  Hematological: Negative for adenopathy.  All other systems reviewed and are negative.    Physical Exam Updated Vital Signs BP 138/88   Pulse 76   Temp 98.4 F (36.9 C)   Resp 18   Ht 5\' 2"  (1.575 m)  Wt 65.8 kg (145 lb)   SpO2 97%   BMI 26.52 kg/m   Physical Exam  Constitutional: She is oriented to person, place, and time. She appears well-developed and well-nourished. No distress.  HENT:  Head: Normocephalic and atraumatic.  Right Ear: Tympanic membrane and ear canal normal.  Left Ear: Tympanic membrane and ear canal normal.  Mouth/Throat: Uvula is midline, oropharynx is clear and moist and mucous membranes are normal. No oropharyngeal exudate.  Eyes: Pupils are equal, round, and reactive to light. EOM are normal.  Neck: Normal range of motion, full passive range of motion without pain and phonation normal. Neck supple.  Cardiovascular: Normal rate, regular rhythm and intact distal pulses.   No murmur heard. Pulmonary/Chest: Effort normal. No stridor. No respiratory distress. She has no rales. She exhibits no tenderness.  Coarse lungs sounds bilaterally, few scattered expiratory wheezes  Musculoskeletal: Normal range of motion. She exhibits no edema.  Lymphadenopathy:    She has no cervical adenopathy.  Neurological: She is alert and oriented to person, place, and time. She exhibits normal muscle tone. Coordination normal.  Skin: Skin is warm and dry.  Nursing note and vitals reviewed.    ED Treatments / Results  Labs (all labs ordered are listed, but only abnormal results are displayed) Labs  Reviewed - No data to display  EKG  EKG Interpretation None       Radiology Dg Chest 2 View  Result Date: 09/30/2017 CLINICAL DATA:  Productive cough.  Chest congestion. EXAM: CHEST  2 VIEW COMPARISON:  08/25/2016. FINDINGS: Normal sized heart. Clear lungs. The lungs remain mildly hyperexpanded with mild diffuse peribronchial thickening. Stable thoracolumbar vertebral compression deformities and sclerosis. No acute fractures seen. IMPRESSION: 1. No acute abnormality. 2. Stable mild changes of COPD and chronic bronchitis. Electronically Signed   By: Claudie Revering M.D.   On: 09/30/2017 14:09    Procedures Procedures (including critical care time)  Medications Ordered in ED Medications  predniSONE (DELTASONE) tablet 40 mg (40 mg Oral Given 09/30/17 1600)  ipratropium-albuterol (DUONEB) 0.5-2.5 (3) MG/3ML nebulizer solution 3 mL (3 mLs Nebulization Given 09/30/17 1614)  albuterol (PROVENTIL) (2.5 MG/3ML) 0.083% nebulizer solution 2.5 mg (2.5 mg Nebulization Given 09/30/17 1614)     Initial Impression / Assessment and Plan / ED Course  I have reviewed the triage vital signs and the nursing notes.  Pertinent labs & imaging results that were available during my care of the patient were reviewed by me and considered in my medical decision making (see chart for details).     Lung sounds improved after albuterol neb.  No hypoxia, tachycardia.   Pt stable for d/c.  Return precautions discussed.  Final Clinical Impressions(s) / ED Diagnoses   Final diagnoses:  Bronchitis    New Prescriptions New Prescriptions   ALBUTEROL (PROVENTIL) (2.5 MG/3ML) 0.083% NEBULIZER SOLUTION    Take 3 mLs (2.5 mg total) by nebulization every 6 (six) hours as needed for wheezing or shortness of breath.   DOXYCYCLINE (VIBRAMYCIN) 100 MG CAPSULE    Take 1 capsule (100 mg total) by mouth 2 (two) times daily.   PREDNISONE (DELTASONE) 20 MG TABLET    Take 2 tablets (40 mg total) by mouth daily.       Kem Parkinson, PA-C 10/02/17 2024    Julianne Rice, MD 10/04/17 Valerie Roys

## 2017-10-03 ENCOUNTER — Other Ambulatory Visit: Payer: Self-pay

## 2017-10-13 DIAGNOSIS — Z79899 Other long term (current) drug therapy: Secondary | ICD-10-CM | POA: Diagnosis not present

## 2017-10-13 DIAGNOSIS — Z79891 Long term (current) use of opiate analgesic: Secondary | ICD-10-CM | POA: Diagnosis not present

## 2017-10-13 DIAGNOSIS — G894 Chronic pain syndrome: Secondary | ICD-10-CM | POA: Diagnosis not present

## 2017-10-20 ENCOUNTER — Encounter (INDEPENDENT_AMBULATORY_CARE_PROVIDER_SITE_OTHER): Payer: Self-pay | Admitting: Orthopedic Surgery

## 2017-10-20 ENCOUNTER — Ambulatory Visit (INDEPENDENT_AMBULATORY_CARE_PROVIDER_SITE_OTHER): Payer: 59 | Admitting: Orthopedic Surgery

## 2017-10-20 DIAGNOSIS — G8929 Other chronic pain: Secondary | ICD-10-CM | POA: Diagnosis not present

## 2017-10-20 DIAGNOSIS — M25511 Pain in right shoulder: Secondary | ICD-10-CM

## 2017-10-20 DIAGNOSIS — M7541 Impingement syndrome of right shoulder: Secondary | ICD-10-CM | POA: Diagnosis not present

## 2017-10-20 NOTE — Progress Notes (Signed)
Office Visit Note   Patient: [name]           Date of Birth: 1951/09/01           MRN: [id number] Visit Date: 10/20/2017              Requested by: Celene Squibb, MD [address] [place], [place] [address] PCP: Celene Squibb, MD  Chief Complaint  Patient presents with  . Right Shoulder - Follow-up    Status post right subacromial injection 09/29/17      HPI: Patient is a 66 year old woman who has had chronic pain in both shoulders worse pain in the right shoulder at this time she has had very minimal relief with a subacromial injection.  Patient's radiographs shows a fleck of bone in the subacromial space most likely secondary to avulsion of the rotator cuff.  Assessment & Plan: Visit Diagnoses:  1. Impingement syndrome of right shoulder   2. Chronic right shoulder pain     Plan: RI scan to further evaluate rotator cuff pathology.  Anticipate patient may benefit from arthroscopic debridement of the right shoulder.  Follow-up after the MRI is obtained.  Follow-Up Instructions: Return if symptoms worsen or fail to improve.   Ortho Exam  Patient is alert, oriented, no adenopathy, well-dressed, normal affect, normal respiratory effort. Examination patient has abduction and flexion to 90 degrees.  She has pain with Neer and Hawkins impingement test pain with a drop arm test.  The biceps tendon is tender to palpation.  She has no radicular symptoms in her right upper extremity is neurovascular intact.  Imaging: No results found. No images are attached to the encounter.  Labs: Lab Results  Component Value Date   REPTSTATUS 08/01/2015 FINAL 07/29/2015   CULT  07/29/2015    50,000 COLONIES/mL ESCHERICHIA COLI Performed at [facility] 07/29/2015    Orders:  Orders Placed This Encounter  Procedures  . MR SHOULDER RIGHT WO CONTRAST   No orders of the defined types were placed in this encounter.    Procedures: No  procedures performed  Clinical Data: No additional findings.  ROS:  All other systems negative, except as noted in the HPI. Review of Systems  Objective: Vital Signs: There were no vitals taken for this visit.  Specialty Comments:  No specialty comments available.  PMFS History: Patient Active Problem List   Diagnosis Date Noted  . Impingement syndrome of right shoulder 09/29/2017  . History of colonic polyps 03/16/2017  . Taking multiple medications for chronic disease 03/16/2017  . Fracture, stress, metatarsal, right, sequela 01/11/2017  . Acute pain of right shoulder 12/16/2016  . Pain in right foot 12/16/2016  . Fracture of base of fifth metatarsal bone with routine healing 11/29/2016  . Humeral head fracture, right, closed, initial encounter 11/17/2016  . Acute respiratory failure with hypercapnia ([place]) 06/16/2015  . Acute respiratory acidosis 06/16/2015  . COPD (chronic obstructive pulmonary disease) ([place]) 06/16/2015  . Narcotic overdose ([place]) 06/15/2015  . Tobacco dependence 05/28/2015  . Acute respiratory failure with hypoxia ([place]) 05/26/2015  . CAP (community acquired pneumonia) 05/25/2015  . Generalized anxiety disorder 01/22/2014   Past Medical History:  Diagnosis Date  . Anxiety    takes Xanax daily  . Brain tumor (benign) ([place])   . Chronic back pain    bulding disc  . Chronic pain   . COPD (chronic obstructive pulmonary disease) ([place])   . Degenerative disc  disease   . Depression    takes Effexor daily  . Disc degeneration   . GERD (gastroesophageal reflux disease)    takes Omeprazole daily  . Headache   . Hepatitis    Hep C  . History of bronchitis    uses inhaler daily  . History of colon polyps   . History of staph infection   . Hyperlipemia    takes Pravastatin daily  . Hypertension    takes Lotrel daily  . Joint pain   . Narcotic overdose ([place]) 06/13/2015  . Numbness in both hands   . Osteoporosis    Fosamax weekly  . Pinched nerve in  shoulder   . Pneumonia 2012  . Substance abuse ([place])    benzos, opiates "from non-prescribed sources"    Family History  Problem Relation Age of Onset  . Bipolar disorder Daughter   . Drug abuse Son   . Drug abuse Grandchild   . Drug abuse Grandchild   . Diabetes Sister   . Cancer Sister   . Colon cancer Neg Hx     Past Surgical History:  Procedure Laterality Date  . ANKLE FRACTURE SURGERY Bilateral early 2000's  . COLONOSCOPY    . COLONOSCOPY WITH PROPOFOL N/A 04/05/2017   Procedure: COLONOSCOPY WITH PROPOFOL;  Surgeon: Danie Binder, MD;  [facility];  Service: Endoscopy;  Laterality: N/A;  1030-rescheduled 4/17 per Tretha Sciara   . FACIAL FRACTURE SURGERY     in the late 90's  . KNEE SURGERY Right   . ORIF CALCANEOUS FRACTURE Right 07/13/2013   Procedure: RIGHT OPEN REDUCTION INTERNAL FIXATION (ORIF) CALCANEUS FRACTURE;  Surgeon: Newt Minion, MD;  Location: [facility];  Service: Orthopedics;  Laterality: Right;  . POLYPECTOMY  04/05/2017   Procedure: POLYPECTOMY;  Surgeon: Danie Binder, MD;  [facility];  Service: Endoscopy;;  colon  . TUBAL LIGATION     Social History   Occupational History  . Not on file.   Social History Main Topics  . Smoking status: Current Every Day Smoker    Packs/day: 0.50    Years: 40.00    Types: Cigarettes  . Smokeless tobacco: Never Used  . Alcohol use No     Comment: denies  . Drug use: No     Comment: Previously used marijuana and cocaine (last use about 25 years ago)  . Sexual activity: Yes    Birth control/ protection: Post-menopausal

## 2017-10-29 ENCOUNTER — Emergency Department (HOSPITAL_COMMUNITY): Payer: Managed Care, Other (non HMO)

## 2017-10-29 ENCOUNTER — Other Ambulatory Visit: Payer: Self-pay

## 2017-10-29 ENCOUNTER — Emergency Department (HOSPITAL_COMMUNITY)
Admission: EM | Admit: 2017-10-29 | Discharge: 2017-10-29 | Disposition: A | Discharge status: Home / Self Care | Payer: Managed Care, Other (non HMO) | Attending: Emergency Medicine | Admitting: Emergency Medicine

## 2017-10-29 ENCOUNTER — Encounter (HOSPITAL_COMMUNITY): Payer: Self-pay | Admitting: Emergency Medicine

## 2017-10-29 DIAGNOSIS — F17210 Nicotine dependence, cigarettes, uncomplicated: Secondary | ICD-10-CM | POA: Insufficient documentation

## 2017-10-29 DIAGNOSIS — R0602 Shortness of breath: Secondary | ICD-10-CM | POA: Diagnosis not present

## 2017-10-29 DIAGNOSIS — J441 Chronic obstructive pulmonary disease with (acute) exacerbation: Secondary | ICD-10-CM | POA: Diagnosis not present

## 2017-10-29 DIAGNOSIS — Z79899 Other long term (current) drug therapy: Secondary | ICD-10-CM | POA: Insufficient documentation

## 2017-10-29 DIAGNOSIS — R05 Cough: Secondary | ICD-10-CM | POA: Diagnosis not present

## 2017-10-29 DIAGNOSIS — I10 Essential (primary) hypertension: Secondary | ICD-10-CM | POA: Diagnosis not present

## 2017-10-29 DIAGNOSIS — R059 Cough, unspecified: Secondary | ICD-10-CM

## 2017-10-29 LAB — COMPREHENSIVE METABOLIC PANEL
ALT: 39 U/L (ref 14–54)
AST: 37 U/L (ref 15–41)
Albumin: 3.1 g/dL — ABNORMAL LOW (ref 3.5–5.0)
Alkaline Phosphatase: 52 U/L (ref 38–126)
Anion gap: 5 (ref 5–15)
BILIRUBIN TOTAL: 0.4 mg/dL (ref 0.3–1.2)
BUN: 11 mg/dL (ref 6–20)
CO2: 29 mmol/L (ref 22–32)
CREATININE: 0.69 mg/dL (ref 0.44–1.00)
Calcium: 8.7 mg/dL — ABNORMAL LOW (ref 8.9–10.3)
Chloride: 100 mmol/L — ABNORMAL LOW (ref 101–111)
GFR calc Af Amer: >60 mL/min (ref >60)
Glucose, Bld: 106 mg/dL — ABNORMAL HIGH (ref 65–99)
Potassium: 4.5 mmol/L (ref 3.5–5.1)
Sodium: 134 mmol/L — ABNORMAL LOW (ref 135–145)
TOTAL PROTEIN: 6.7 g/dL (ref 6.5–8.1)

## 2017-10-29 LAB — CBC WITH DIFFERENTIAL/PLATELET
BASOS ABS: 0 10*3/uL (ref 0.0–0.1)
Basophils Relative: 0 %
EOS PCT: 2 %
Eosinophils Absolute: 0.2 10*3/uL (ref 0.0–0.7)
HEMATOCRIT: 37.4 % (ref 36.0–46.0)
Hemoglobin: 12.3 g/dL (ref 12.0–15.0)
Lymphocytes Relative: 19 %
Lymphs Abs: 2.3 10*3/uL (ref 0.7–4.0)
MCH: 32.3 pg (ref 26.0–34.0)
MCHC: 32.9 g/dL (ref 30.0–36.0)
MCV: 98.2 fL (ref 78.0–100.0)
MONO ABS: 1.3 10*3/uL — AB (ref 0.1–1.0)
MONOS PCT: 11 %
Neutro Abs: 8.3 10*3/uL — ABNORMAL HIGH (ref 1.7–7.7)
Neutrophils Relative %: 68 %
Platelets: 244 10*3/uL (ref 150–400)
RBC: 3.81 MIL/uL — ABNORMAL LOW (ref 3.87–5.11)
RDW: 14.4 % (ref 11.5–15.5)
WBC: 12.2 10*3/uL — ABNORMAL HIGH (ref 4.0–10.5)

## 2017-10-29 MED ORDER — PREDNISONE 20 MG PO TABS: 40.0000 mg | ORAL_TABLET | Freq: Every day | ORAL | 0 refills | Status: AC

## 2017-10-29 MED ORDER — IPRATROPIUM-ALBUTEROL 0.5-2.5 (3) MG/3ML IN SOLN
3.0000 mL | Freq: Once | RESPIRATORY_TRACT | Status: AC
  Administered 2017-10-29: 3 mL via RESPIRATORY_TRACT
  Filled 2017-10-29: qty 3

## 2017-10-29 MED ORDER — ALBUTEROL SULFATE HFA 108 (90 BASE) MCG/ACT IN AERS: 1.0000 | INHALATION_SPRAY | Freq: Four times a day (QID) | RESPIRATORY_TRACT | 0 refills | Status: AC | PRN

## 2017-10-29 MED ORDER — AZITHROMYCIN 250 MG PO TABS: 250.0000 mg | ORAL_TABLET | Freq: Every day | ORAL | 0 refills | Status: AC

## 2017-10-29 NOTE — ED Triage Notes (Signed)
Patient c/o productive cough with thick green/yellow sputum x3 weeks. Per patient was seen here in ED 3 weeks ago and diagnosed with URI infection. Patient given antibiotics and prednisone. Patent states getting progressively worse despite taking medications. Denies any fevers, nausea, vomiting, ear pain, or throat pain. Per patient body aches.

## 2017-10-29 NOTE — ED Provider Notes (Signed)
Emergency Department Provider Note   I have reviewed the triage vital signs and the nursing notes.   HISTORY  Chief Complaint Cough   HPI SAMATHA ANSPACH is a 66 y.o. female with PMH of COPD, Hep C, chronic back pain, and HTN presents to the emergency department for evaluation of productive cough worsening over the past 3 weeks.  She states that she was seen and treated in the emergency department toward the beginning of her symptoms with antibiotics and steroids.  She continued taking this but symptoms did not improve.  She has been treating herself at home with albuterol with no significant relief.  She denies any fevers but states she has had some body aches.  She had one episode of vomiting and one episode of diarrhea earlier this week but those symptoms have not persisted.  Her great grandson was diagnosed with pneumonia recently and has been on antibiotics.  The sputum.  She does have some left-sided chest discomfort only with coughing with no symptoms at rest. Patient continues to smoke cigarettes but is trying to cut back.    Past Medical History:  Diagnosis Date  . Anxiety    takes Xanax daily  . Brain tumor (benign) ([place])   . Chronic back pain    bulding disc  . Chronic pain   . COPD (chronic obstructive pulmonary disease) ([place])   . Degenerative disc disease   . Depression    takes Effexor daily  . Disc degeneration   . GERD (gastroesophageal reflux disease)    takes Omeprazole daily  . Headache   . Hepatitis    Hep C  . History of bronchitis    uses inhaler daily  . History of colon polyps   . History of staph infection   . Hyperlipemia    takes Pravastatin daily  . Hypertension    takes Lotrel daily  . Joint pain   . Narcotic overdose ([place]) 06/13/2015  . Numbness in both hands   . Osteoporosis    Fosamax weekly  . Pinched nerve in shoulder   . Pneumonia 2012  . Substance abuse ([place])    benzos, opiates "from non-prescribed sources"    Patient  Active Problem List   Diagnosis Date Noted  . Impingement syndrome of right shoulder 09/29/2017  . History of colonic polyps 03/16/2017  . Taking multiple medications for chronic disease 03/16/2017  . Fracture, stress, metatarsal, right, sequela 01/11/2017  . Acute pain of right shoulder 12/16/2016  . Pain in right foot 12/16/2016  . Fracture of base of fifth metatarsal bone with routine healing 11/29/2016  . Humeral head fracture, right, closed, initial encounter 11/17/2016  . Acute respiratory failure with hypercapnia ([place]) 06/16/2015  . Acute respiratory acidosis 06/16/2015  . COPD (chronic obstructive pulmonary disease) ([place]) 06/16/2015  . Narcotic overdose ([place]) 06/15/2015  . Tobacco dependence 05/28/2015  . Acute respiratory failure with hypoxia ([place]) 05/26/2015  . CAP (community acquired pneumonia) 05/25/2015  . Generalized anxiety disorder 01/22/2014    Past Surgical History:  Procedure Laterality Date  . ANKLE FRACTURE SURGERY Bilateral early 2000's  . COLONOSCOPY    . FACIAL FRACTURE SURGERY     in the late 90's  . KNEE SURGERY Right   . TUBAL LIGATION      Current Outpatient Rx  . Order #: [id number] Class: Print  . Order #: [id number] Class: Print  . Order #: [id number] Class: Historical Med  . Order #: [id number] Class: Historical Med  . Order #: [id number] Class: Print  .  Order #: [id number] Class: Print  . Order #: [id number] Class: Historical Med  . Order #: [id number] Class: Historical Med  . Order #: [id number] Class: Historical Med  . Order #: [id number] Class: Historical Med  . Order #: [id number] Class: Print  . Order #: [id number] Class: Normal  . Order #: [id number] Class: Historical Med  . Order #: [id number] Class: Normal    Allergies Patient has no known allergies.  Family History  Problem Relation Age of Onset  . Bipolar disorder Daughter   . Drug abuse Son   . Drug abuse Grandchild   . Drug abuse Grandchild   . Diabetes Sister   . Cancer Sister   . Colon cancer  Neg Hx     Social History Social History   Tobacco Use  . Smoking status: Current Every Day Smoker    Packs/day: 0.50    Years: 40.00    Pack years: 20.00    Types: Cigarettes  . Smokeless tobacco: Never Used  Substance Use Topics  . Alcohol use: No    Comment: denies  . Drug use: No    Comment: Previously used marijuana and cocaine (last use about 25 years ago)    Review of Systems  Constitutional: No fever/chills. Positive body aches and fatigue.  Eyes: No visual changes. ENT: No sore throat. Cardiovascular: Denies chest pain. Respiratory: Denies shortness of breath. Positive productive cough.  Gastrointestinal: No abdominal pain. Positive nausea. Vomiting and diarrhea x 1 earlier this week have resolved.  No constipation. Genitourinary: Negative for dysuria. Musculoskeletal: Negative for back pain. Skin: Negative for rash. Neurological: Negative for headaches, focal weakness or numbness.  10-point ROS otherwise negative.  ____________________________________________   PHYSICAL EXAM:  VITAL SIGNS: ED Triage Vitals  Enc Vitals Group     BP 10/29/17 1426 134/80     Pulse Rate 10/29/17 1426 89     Resp 10/29/17 1426 18     Temp 10/29/17 1426 98.6 F (37 C)     Temp Source 10/29/17 1426 Oral     SpO2 10/29/17 1427 94 %     Weight 10/29/17 1426 145 lb (65.8 kg)     Height 10/29/17 1426 5\' 2"  (1.575 m)     Pain Score 10/29/17 1427 6   Constitutional: Alert and oriented. Well appearing and in no acute distress. Eyes: Conjunctivae are normal.  Head: Atraumatic. Nose: No congestion/rhinnorhea. Mouth/Throat: Mucous membranes are moist.  Oropharynx non-erythematous. Neck: No stridor.  Cardiovascular: Normal rate, regular rhythm. Good peripheral circulation. Grossly normal heart sounds.   Respiratory: Increased respiratory effort.  No retractions. Lungs with faint diffuse end-expiratory wheezing throughout.  Gastrointestinal: Soft and nontender. No distention.    Musculoskeletal: No lower extremity tenderness nor edema. No gross deformities of extremities. Neurologic:  Normal speech and language. No gross focal neurologic deficits are appreciated.  Skin:  Skin is warm, dry and intact. No rash noted.  ____________________________________________   LABS (all labs ordered are listed, but only abnormal results are displayed)  Labs Reviewed  CBC WITH DIFFERENTIAL/PLATELET - Abnormal; Notable for the following components:      Result Value   WBC 12.2 (*)    RBC 3.81 (*)    Neutro Abs 8.3 (*)    Monocytes Absolute 1.3 (*)    All other components within normal limits  COMPREHENSIVE METABOLIC PANEL - Abnormal; Notable for the following components:   Sodium 134 (*)    Chloride 100 (*)    Glucose, Bld 106 (*)    Calcium 8.7 (*)    Albumin  3.1 (*)    All other components within normal limits   ____________________________________________  RADIOLOGY  Dg Chest 2 View  Result Date: 10/29/2017 CLINICAL DATA:  Shortness of breath and cough. EXAM: CHEST  2 VIEW COMPARISON:  September 19, 2017 FINDINGS: No pneumothorax. Bronchitic changes in the lungs are stable with no focal infiltrate. The cardiomediastinal silhouette is normal. No nodules or masses. Wedging of thoracic vertebral bodies is stable. IMPRESSION: 1. Stable bronchitic changes in the lungs. 2. Stable wedging of thoracic vertebral bodies. Electronically Signed   By: Dorise Bullion III M.D   On: 10/29/2017 15:44    ____________________________________________   PROCEDURES  Procedure(s) performed:   Procedures  None ____________________________________________   INITIAL IMPRESSION / ASSESSMENT AND PLAN / ED COURSE  Pertinent labs & imaging results that were available during my care of the patient were reviewed by me and considered in my medical decision making (see chart for details).  Patient presents to the emergency department for evaluation of productive cough worsening over  the past 3 weeks.  She has COPD and continues to smoke cigarettes.  No fevers but has had some body aches.  Vital signs including saturation is relatively unremarkable here.  She does have some faint wheezing on exam.  Plan for chest x-ray, lab work, reassess.  Suspect COPD flare with lower suspicion for pneumonia will follow chest x-ray.  04:13 PM Chest x-ray negative for pneumonia.  Suspect COPD symptoms.  I had a Christain Niznik discussion with the patient regard cessation and that stopping smoking may improve her COPD symptoms significantly.  She follows up with her primary care physician in the next 4 days.  At that time she will discuss possible referral to pulmonology.  Plan for discharge with steroid burst, albuterol inhaler, and brief abx course.   At this time, I do not feel there is any life-threatening condition present. I have reviewed and discussed all results (EKG, imaging, lab, urine as appropriate), exam findings with patient. I have reviewed nursing notes and appropriate previous records.  I feel the patient is safe to be discharged home without further emergent workup. Discussed usual and customary return precautions. Patient and family (if present) verbalize understanding and are comfortable with this plan.  Patient will follow-up with their primary care provider. If they do not have a primary care provider, information for follow-up has been provided to them. All questions have been answered.  ____________________________________________  FINAL CLINICAL IMPRESSION(S) / ED DIAGNOSES  Final diagnoses:  COPD exacerbation ([place])  Cough     MEDICATIONS GIVEN DURING THIS VISIT:  Medications  ipratropium-albuterol (DUONEB) 0.5-2.5 (3) MG/3ML nebulizer solution 3 mL (3 mLs Nebulization Given 10/29/17 1512)    Note:  This document was prepared using Dragon voice recognition software and may include unintentional dictation errors.  Nanda Quinton, MD Emergency Medicine    [facility] [place], [facility],  MD 10/29/17 [phone]

## 2017-10-29 NOTE — Discharge Instructions (Signed)
We believe that your symptoms are caused today by an exacerbation of your COPD, and possibly bronchitis.  Please take the prescribed medications and any medications that you have at home for your COPD.  Follow up with your doctor as recommended.  If you develop any new or worsening symptoms, including but not limited to fever, persistent vomiting, worsening shortness of breath, or other symptoms that concern you, please return to the Emergency Department immediately. ° ° °Chronic Obstructive Pulmonary Disease °Chronic obstructive pulmonary disease (COPD) is a common lung condition in which airflow from the lungs is limited. COPD is a general term that can be used to describe many different lung problems that limit airflow, including both chronic bronchitis and emphysema.  If you have COPD, your lung function will probably never return to normal, but there are measures you can take to improve lung function and make yourself feel better.  °CAUSES  °Smoking (common).   °Exposure to secondhand smoke.   °Genetic problems. °Chronic inflammatory lung diseases or recurrent infections. °SYMPTOMS  °Shortness of breath, especially with physical activity.   °Deep, persistent (chronic) cough with a large amount of thick mucus.   °Wheezing.   °Rapid breaths (tachypnea).   °Gray or bluish discoloration (cyanosis) of the skin, especially in fingers, toes, or lips.   °Fatigue.   °Weight loss.   °Frequent infections or episodes when breathing symptoms become much worse (exacerbations).   °Chest tightness. °DIAGNOSIS  °Your health care provider will take a medical history and perform a physical examination to make the initial diagnosis.  Additional tests for COPD may include:  °Lung (pulmonary) function tests. °Chest X-ray. °CT scan. °Blood tests. °TREATMENT  °Treatment available to help you feel better when you have COPD includes:  °Inhaler and nebulizer medicines. These help manage the symptoms of COPD and make your breathing more  comfortable. °Supplemental oxygen. Supplemental oxygen is only helpful if you have a low oxygen level in your blood.   °Exercise and physical activity. These are beneficial for nearly all people with COPD. Some people may also benefit from a pulmonary rehabilitation program. °HOME CARE INSTRUCTIONS  °Take all medicines (inhaled or pills) as directed by your health care provider. °Avoid over-the-counter medicines or cough syrups that dry up your airway (such as antihistamines) and slow down the elimination of secretions unless instructed otherwise by your health care provider.   °If you are a smoker, the most important thing that you can do is stop smoking. Continuing to smoke will cause further lung damage and breathing trouble. Ask your health care provider for help with quitting smoking. He or she can direct you to community resources or hospitals that provide support. °Avoid exposure to irritants such as smoke, chemicals, and fumes that aggravate your breathing. °Use oxygen therapy and pulmonary rehabilitation if directed by your health care provider. If you require home oxygen therapy, ask your health care provider whether you should purchase a pulse oximeter to measure your oxygen level at home.   °Avoid contact with individuals who have a contagious illness. °Avoid extreme temperature and humidity changes. °Eat healthy foods. Eating smaller, more frequent meals and resting before meals may help you maintain your strength. °Stay active, but balance activity with periods of rest. Exercise and physical activity will help you maintain your ability to do things you want to do. °Preventing infection and hospitalization is very important when you have COPD. Make sure to receive all the vaccines your health care provider recommends, especially the pneumococcal and influenza   vaccines. Ask your health care provider whether you need a pneumonia vaccine. °Learn and use relaxation techniques to manage stress. °Learn and  use controlled breathing techniques as directed by your health care provider. Controlled breathing techniques include:   °Pursed lip breathing. Start by breathing in (inhaling) through your nose for 1 second. Then, purse your lips as if you were going to whistle and breathe out (exhale) through the pursed lips for 2 seconds.   °Diaphragmatic breathing. Start by putting one hand on your abdomen just above your waist. Inhale slowly through your nose. The hand on your abdomen should move out. Then purse your lips and exhale slowly. You should be able to feel the hand on your abdomen moving in as you exhale.   °Learn and use controlled coughing to clear mucus from your lungs. Controlled coughing is a series of short, progressive coughs. The steps of controlled coughing are:   °Lean your head slightly forward.   °Breathe in deeply using diaphragmatic breathing.   °Try to hold your breath for 3 seconds.   °Keep your mouth slightly open while coughing twice.   °Spit any mucus out into a tissue.   °Rest and repeat the steps once or twice as needed. °SEEK MEDICAL CARE IF:  °You are coughing up more mucus than usual.   °There is a change in the color or thickness of your mucus.   °Your breathing is more labored than usual.   °Your breathing is faster than usual.   °SEEK IMMEDIATE MEDICAL CARE IF:  °You have shortness of breath while you are resting.   °You have shortness of breath that prevents you from: °Being able to talk.   °Performing your usual physical activities.   °You have chest pain lasting longer than 5 minutes.   °Your skin color is more cyanotic than usual. °You measure low oxygen saturations for longer than 5 minutes with a pulse oximeter. °MAKE SURE YOU:  °Understand these instructions. °Will watch your condition. °Will get help right away if you are not doing well or get worse. °Document Released: 09/15/2005 Document Revised: 04/22/2014 Document Reviewed: 08/02/2013 °ExitCare® Patient Information ©2015  [employer]. This information is not intended to replace advice given to you by your health care provider. Make sure you discuss any questions you have with your health care provider. ° °

## 2017-11-02 DIAGNOSIS — J441 Chronic obstructive pulmonary disease with (acute) exacerbation: Secondary | ICD-10-CM | POA: Diagnosis not present

## 2017-11-07 ENCOUNTER — Ambulatory Visit
Admission: RE | Admit: 2017-11-07 | Discharge: 2017-11-07 | Disposition: A | Discharge status: Home / Self Care | Payer: Medicare Other | Source: Ambulatory Visit | Attending: Orthopedic Surgery | Admitting: Orthopedic Surgery

## 2017-11-07 DIAGNOSIS — M25511 Pain in right shoulder: Principal | ICD-10-CM

## 2017-11-07 DIAGNOSIS — G8929 Other chronic pain: Secondary | ICD-10-CM

## 2017-11-08 ENCOUNTER — Encounter (HOSPITAL_COMMUNITY): Payer: Self-pay | Admitting: *Deleted

## 2017-11-08 ENCOUNTER — Other Ambulatory Visit: Payer: Self-pay

## 2017-11-08 ENCOUNTER — Emergency Department (HOSPITAL_COMMUNITY)
Admission: EM | Admit: 2017-11-08 | Discharge: 2017-11-08 | Disposition: A | Discharge status: Home / Self Care | Payer: Managed Care, Other (non HMO) | Attending: Emergency Medicine | Admitting: Emergency Medicine

## 2017-11-08 DIAGNOSIS — T401X1A Poisoning by heroin, accidental (unintentional), initial encounter: Secondary | ICD-10-CM | POA: Diagnosis not present

## 2017-11-08 DIAGNOSIS — F17210 Nicotine dependence, cigarettes, uncomplicated: Secondary | ICD-10-CM | POA: Diagnosis not present

## 2017-11-08 DIAGNOSIS — J449 Chronic obstructive pulmonary disease, unspecified: Secondary | ICD-10-CM | POA: Insufficient documentation

## 2017-11-08 DIAGNOSIS — M79603 Pain in arm, unspecified: Secondary | ICD-10-CM | POA: Diagnosis not present

## 2017-11-08 DIAGNOSIS — G8929 Other chronic pain: Secondary | ICD-10-CM | POA: Diagnosis not present

## 2017-11-08 DIAGNOSIS — I10 Essential (primary) hypertension: Secondary | ICD-10-CM | POA: Diagnosis not present

## 2017-11-08 DIAGNOSIS — T50904A Poisoning by unspecified drugs, medicaments and biological substances, undetermined, initial encounter: Secondary | ICD-10-CM | POA: Diagnosis not present

## 2017-11-08 MED ORDER — NALOXONE HCL 4 MG/0.1ML NA LIQD: NASAL | 0 refills | Status: AC

## 2017-11-08 NOTE — Discharge Instructions (Signed)
Substance Abuse Treatment Programs ° °Intensive Outpatient Programs °[facility]     °[address]      °[place], [place]                   °[phone]      ° °[facility] °[address] °[place], [place] °[phone] ° °[facility]     °(Inpatient and outpatient)     °[address]           °[phone]   ° °[facility] °[phone] (Suboxone and Methadone) ° °[address]      °[place], [place] [address]      °[phone]      ° °[address] °[place], [place] °852-3033 ° °[facility] (Outpatient/Inpatient, [facility])    °(insurance only) [phone]      °       °[facility] (Groups & Residential) °[place], [place] °[phone] ° °   °[facility]     °[address]     °[place], [place]      °[phone]      ° °[facility] (for caregivers and family) °[address] °[place], [place] °336-299-4655 ° ° ° ° ° °Residential Treatment Programs °[facility]      °[address], [place], [place] [address]  °([phone]      ° °[facility] °[address] [place], [place] [address] °[phone] ° °[facility]        °336-248-8914      ° °[facility] °1-800-659-3381 ° °[facility]             °[address]                                         °[place], [place]                                                °877-615-2722 or [phone]                              ° °[facility] [place] °[address] °[place], [address] °1.877.941.8954 ° °[facility]    °[address]      °[place], [place]     °[phone]      ° °[facility] °[address] °[place], [place] °336-285-9073 ° °[facility]   °[address]     °[place], [place] [address]     °[phone]      °Admissions: 8am-3pm M-F ° °[facility] °[address] °[place] °336-227-7417 ° °[facility]: Residential Program (90 Days)   °[place], [place]      °336-725-8389 or 800-758-6077    ° °[facility] °[place],  °(Walk in Hours over the weekend or by referral) ° °[facility] °[address], [place],  [address] °([phone] ° °Crisis Mobile: [facility]:  1-877-626-1772 (for crisis response 24 hours a day) °[facility] Hotline:      1-800-256-2452 °Outpatient Psychiatry and Counseling ° °[facility]: Mobile Crisis   Management 24 hours:  1-877-626-1772 ° °[facility] sliding scale fee and walk in schedule: M-F 8am-12pm/1pm-3pm °[address]  °[place], [place] [address] °[phone] ° °[facility] °[address] °[place], [place] [address] °336-703-9650 ° °[facility] ([facility])- new patient walk-in appointments available Monday - Friday 8am -3pm.          °[address] °[place], [place] [address] °336-676-6840 or crisis line- 336-676-6905 ° °[facility]/ Intensive Outpatient Therapy Program °[address] °[place], [place] [address] °336-832-9804 ° °[facility]                  °Crisis Services      °[phone]      °[address]     °[place], [place] [address]                ° °[facility]   °[facility] °[phone] °[address] °[place], [place] [address] ° ° °[facility]          °[address]  °[place], [place] [address]       °[phone] ° °[facility] °[address] °[place], [place] [address] °336-292-1510 ° °[facility]    °[address]    °[place], [place] [address]     °[phone]      ° °Parish McKinney, MD     °[address]     °[place] [address]     °[phone]     °  °[facility] °[address] °[place] [address] ° °[facility]     °[address]     °[place], [place]      °[phone]      ° °[facility] °Shamsher Ahluwalia, MD °[address] °[place], [place] [address] °[phone] ° °[facility]     °[address]     °[place], [place] [address]     °336-274-1237      ° °[facility] °[address] °[place], [place] [address] °336-854-4450 °Resources for Temporary Residential Assistance/Crisis Centers ° °DAY CENTERS °[facility] °M-F 8am-3pm   °[address] [place], [place] [address]   336-332-0824 °Services include: laundry, barbering, support groups, case management, phone  & computer access, showers, AA/NA mtgs, mental health/substance abuse nurse, job skills class, disability information, VA assistance, spiritual classes, etc.  ° °HOMELESS SHELTERS ° °[facility]     °[facility]   °[address], [place]     °[phone]       °       °[facility] (women and children)       °[address] °[place], [place] [address] °336-275-0820 °[email or web address] for application and process °Application Required ° °[facility]   °[address]    °[place] [address]     °[phone]       °             °[facility] °[address] °[place], [place] [address] °336.273.5572 °336-235-0363(schedule application appt.) °Application Required ° °[facility] (women only)    °[address]     °[place], [place] [address]     °336-884-1039      °  Intake starts 6pm daily °Need valid ID, SSC, & Police report °[facility] [place] °[address] °[place], [place] °336-881-5420 °Application Required ° °[facility] (men only)     °[address]      °[place], [place]     °[phone]      ° °[facility] °(Pregnant women only) °[address] °[place], [place] °336-275-0206 ° °[facility]      °[address]      °[place], [place] [address]     °[phone]      °       °[facility] °[address] °[place], [place] °336-723-1848 °90 day commitment/SA/Application process ° °[facility](men only)     °[address]     °[place], [place]     °[phone]       °Check-in at 7pm     °       °[facility] °[address] °[place], [place] [address] °[phone] °Men/Women/Women and Children must be there by 7 pm ° °[facility] °[place], [place] °336-722-8721                ° °

## 2017-11-08 NOTE — ED Triage Notes (Signed)
Pt brought in by [facility] for c/o overdose on heroin; pt states she used it to get pain relief from bilateral arm pain; pt's son administered narcan 2mg  at home pta of ems; pt is alert and oriented at this time; ems reports pt was less alert en route to hospital

## 2017-11-08 NOTE — ED Provider Notes (Signed)
Emergency Department Provider Note   I have reviewed the triage vital signs and the nursing notes.   HISTORY  Chief Complaint Drug Overdose   HPI [name] is a 66 y.o. female with PMH of COPD, GERD, Hep C, and polysubstance abuse presents to the emergency department for evaluation after unintentional heroin overdose.  Patient states that she suffers from chronic arm pain and was cut off from her pain clinic.  Today she decided to snort a small amount of heroin times.  She denies doing this in the past.  She denies IV drug use.  She was apparently found at home unresponsive.  The patient's son administered 2 mg of home Narcan and EMS was called.  The patient was somewhat drowsy in route but did not require additional Narcan.   At the time of my evaluation she denies any chest pain or difficulty breathing.  She states that this was an unintentional overdose and she denies any thoughts of self-harm.    Past Medical History:  Diagnosis Date  . Anxiety    takes Xanax daily  . Brain tumor (benign) ([place])   . Chronic back pain    bulding disc  . Chronic pain   . COPD (chronic obstructive pulmonary disease) ([place])   . Degenerative disc disease   . Depression    takes Effexor daily  . Disc degeneration   . GERD (gastroesophageal reflux disease)    takes Omeprazole daily  . Headache   . Hepatitis    Hep C  . History of bronchitis    uses inhaler daily  . History of colon polyps   . History of staph infection   . Hyperlipemia    takes Pravastatin daily  . Hypertension    takes Lotrel daily  . Joint pain   . Narcotic overdose ([place]) 06/13/2015  . Numbness in both hands   . Osteoporosis    Fosamax weekly  . Pinched nerve in shoulder   . Pneumonia 2012  . Substance abuse ([place])    benzos, opiates "from non-prescribed sources"    Patient Active Problem List   Diagnosis Date Noted  . Impingement syndrome of right shoulder 09/29/2017  . History of colonic polyps  03/16/2017  . Taking multiple medications for chronic disease 03/16/2017  . Fracture, stress, metatarsal, right, sequela 01/11/2017  . Acute pain of right shoulder 12/16/2016  . Pain in right foot 12/16/2016  . Fracture of base of fifth metatarsal bone with routine healing 11/29/2016  . Humeral head fracture, right, closed, initial encounter 11/17/2016  . Acute respiratory failure with hypercapnia ([place]) 06/16/2015  . Acute respiratory acidosis 06/16/2015  . COPD (chronic obstructive pulmonary disease) ([place]) 06/16/2015  . Narcotic overdose ([place]) 06/15/2015  . Tobacco dependence 05/28/2015  . Acute respiratory failure with hypoxia ([place]) 05/26/2015  . CAP (community acquired pneumonia) 05/25/2015  . Generalized anxiety disorder 01/22/2014    Past Surgical History:  Procedure Laterality Date  . ANKLE FRACTURE SURGERY Bilateral early 2000's  . COLONOSCOPY    . COLONOSCOPY WITH PROPOFOL N/A 04/05/2017   Procedure: COLONOSCOPY WITH PROPOFOL;  Surgeon: Danie Binder, MD;  [facility];  Service: Endoscopy;  Laterality: N/A;  1030-rescheduled 4/17 per Tretha Sciara   . FACIAL FRACTURE SURGERY     in the late 90's  . KNEE SURGERY Right   . ORIF CALCANEOUS FRACTURE Right 07/13/2013   Procedure: RIGHT OPEN REDUCTION INTERNAL FIXATION (ORIF) CALCANEUS FRACTURE;  Surgeon: Newt Minion, MD;  Location:  [facility];  Service: Orthopedics;  Laterality: Right;  . POLYPECTOMY  04/05/2017   Procedure: POLYPECTOMY;  Surgeon: Danie Binder, MD;  [facility];  Service: Endoscopy;;  colon  . TUBAL LIGATION      Current Outpatient Rx  . Order #: [id number] Class: Print  . Order #: [id number] Class: Print  . Order #: [id number] Class: Historical Med  . Order #: [id number] Class: Historical Med  . Order #: [id number] Class: Historical Med  . Order #: [id number] Class: Historical Med  . Order #: [id number] Class: Historical Med  . Order #: [id number] Class: Historical Med  . Order #: [id number] Class: Historical  Med  . Order #: [id number] Class: Historical Med  . Order #: [id number] Class: Normal  . Order #: [id number] Class: Print  . Order #: [id number] Class: Print  . Order #: [id number] Class: Print    Allergies Patient has no known allergies.  Family History  Problem Relation Age of Onset  . Bipolar disorder Daughter   . Drug abuse Son   . Drug abuse Grandchild   . Drug abuse Grandchild   . Diabetes Sister   . Cancer Sister   . Colon cancer Neg Hx     Social History Social History   Tobacco Use  . Smoking status: Current Every Day Smoker    Packs/day: 0.50    Years: 40.00    Pack years: 20.00    Types: Cigarettes  . Smokeless tobacco: Never Used  Substance Use Topics  . Alcohol use: No    Comment: denies  . Drug use: No    Comment: Previously used marijuana and cocaine (last use about 25 years ago)    Review of Systems  Constitutional: No fever/chills Eyes: No visual changes. ENT: No sore throat. Cardiovascular: Denies chest pain. Respiratory: Denies shortness of breath. Gastrointestinal: No abdominal pain.  No nausea, no vomiting.  No diarrhea.  No constipation. Genitourinary: Negative for dysuria. Musculoskeletal: Negative for back pain. Chronic arm pain.  Skin: Negative for rash. Neurological: Negative for headaches, focal weakness or numbness.  10-point ROS otherwise negative.  ____________________________________________   PHYSICAL EXAM:  VITAL SIGNS: ED Triage Vitals  Enc Vitals Group     BP 11/08/17 2112 (!) 104/39     Pulse Rate 11/08/17 2112 82     Resp 11/08/17 2112 13     Temp 11/08/17 2122 100 F (37.8 C)     Temp Source 11/08/17 2122 Oral     SpO2 11/08/17 2112 (!) 87 %     Weight 11/08/17 2111 145 lb (65.8 kg)     Height 11/08/17 2111 '5\' 2"'$  (1.575 m)     Pain Score 11/08/17 2111 6   Constitutional: Alert and oriented. Well appearing and in no acute distress. Conversational.  Eyes: Conjunctivae are normal. PERRL (3 mm).  Head:  Atraumatic. Nose: No congestion/rhinnorhea. Mouth/Throat: Mucous membranes are moist. Neck: No stridor.   Cardiovascular: Normal rate, regular rhythm. Good peripheral circulation. Grossly normal heart sounds.   Respiratory: Normal respiratory effort.  No retractions. Lungs CTAB. Gastrointestinal: Soft and nontender. No distention.  Musculoskeletal: No lower extremity tenderness nor edema. No gross deformities of extremities. Neurologic:  Normal speech and language. No gross focal neurologic deficits are appreciated.  Skin:  Skin is warm, dry and intact. No rash noted. ____________________________________________  EKG   EKG Interpretation  Date/Time:  Tuesday November 08 2017 21:13:25 EST Ventricular Rate:  83 PR Interval:    QRS Duration: 87 QT Interval:  342 QTC Calculation: 402 R Axis:   85 Text Interpretation:  Sinus rhythm Short PR interval Low voltage with right axis deviation No STEMI.  Confirmed by Nanda Quinton [phone] on 11/08/2017 11:09:13 PM       ____________________________________________  RADIOLOGY  None ____________________________________________   PROCEDURES  Procedure(s) performed:   Procedures  None ____________________________________________   INITIAL IMPRESSION / ASSESSMENT AND PLAN / ED COURSE  Pertinent labs & imaging results that were available during my care of the patient were reviewed by me and considered in my medical decision making (see chart for details).  Patient presents to the emergency department for evaluation of unintentional heroin overdose.  She was administered 2 mg of Narcan on scene by family member and regained consciousness.  No residual chest pain or dyspnea.  The patient is awake and alert on my initial exam.  Plan for ED observation and reassessment. EKG reviewed from triage. No indication for lab work or additional narcan at this time. Plan to discharge home with narcan Rx.   11:30 PM Patient remains awake and  alert. Some initial hypoxemia but clinical picture is complicated by COPD history. She appears well. Plan for Rx for Narcan. Family at bedside to take her home and watch for additional signs of OD. Provided list of recourses for outpatient detox and substance abuse assistance.   At this time, I do not feel there is any life-threatening condition present. I have reviewed and discussed all results (EKG, imaging, lab, urine as appropriate), exam findings with patient. I have reviewed nursing notes and appropriate previous records.  I feel the patient is safe to be discharged home without further emergent workup. Discussed usual and customary return precautions. Patient and family (if present) verbalize understanding and are comfortable with this plan.  Patient will follow-up with their primary care provider. If they do not have a primary care provider, information for follow-up has been provided to them. All questions have been answered.  ____________________________________________  FINAL CLINICAL IMPRESSION(S) / ED DIAGNOSES  Final diagnoses:  Accidental overdose of heroin, initial encounter ([place])     MEDICATIONS GIVEN DURING THIS VISIT:  None  NEW OUTPATIENT MEDICATIONS STARTED DURING THIS VISIT:  Narcan nasal spray kit.   Note:  This document was prepared using Dragon voice recognition software and may include unintentional dictation errors.  Nanda Quinton, MD Emergency Medicine    Long, [facility], MD 11/08/17 [phone]

## 2017-11-16 DIAGNOSIS — J449 Chronic obstructive pulmonary disease, unspecified: Secondary | ICD-10-CM | POA: Diagnosis not present

## 2017-11-17 ENCOUNTER — Ambulatory Visit (INDEPENDENT_AMBULATORY_CARE_PROVIDER_SITE_OTHER): Payer: Medicare Other | Admitting: Orthopedic Surgery

## 2017-11-21 ENCOUNTER — Other Ambulatory Visit (HOSPITAL_COMMUNITY): Payer: Self-pay | Admitting: Respiratory Therapy

## 2017-11-21 DIAGNOSIS — J441 Chronic obstructive pulmonary disease with (acute) exacerbation: Secondary | ICD-10-CM

## 2017-11-28 ENCOUNTER — Ambulatory Visit (INDEPENDENT_AMBULATORY_CARE_PROVIDER_SITE_OTHER): Payer: Medicare Other | Admitting: Orthopedic Surgery

## 2017-12-01 ENCOUNTER — Ambulatory Visit (HOSPITAL_COMMUNITY): Admission: RE | Admit: 2017-12-01 | Payer: Medicare Other | Source: Ambulatory Visit

## 2017-12-21 ENCOUNTER — Encounter (INDEPENDENT_AMBULATORY_CARE_PROVIDER_SITE_OTHER): Payer: Self-pay | Admitting: Family

## 2017-12-21 ENCOUNTER — Ambulatory Visit (INDEPENDENT_AMBULATORY_CARE_PROVIDER_SITE_OTHER): Payer: Medicare Other

## 2017-12-21 ENCOUNTER — Ambulatory Visit (INDEPENDENT_AMBULATORY_CARE_PROVIDER_SITE_OTHER): Payer: 59 | Admitting: Family

## 2017-12-21 DIAGNOSIS — G5761 Lesion of plantar nerve, right lower limb: Secondary | ICD-10-CM | POA: Diagnosis not present

## 2017-12-21 DIAGNOSIS — M79671 Pain in right foot: Secondary | ICD-10-CM | POA: Diagnosis not present

## 2017-12-21 MED ORDER — METHYLPREDNISOLONE ACETATE 40 MG/ML IJ SUSP
40.0000 mg | INTRAMUSCULAR | Status: AC | PRN
  Administered 2017-12-21: 40 mg via INTRA_ARTICULAR

## 2017-12-21 MED ORDER — LIDOCAINE HCL 1 % IJ SOLN
1.0000 mL | INTRAMUSCULAR | Status: AC | PRN
  Administered 2017-12-21: 1 mL

## 2017-12-21 NOTE — Progress Notes (Signed)
Office Visit Note   Patient: [name]           Date of Birth: March 15, 1951           MRN: [id number] Visit Date: 12/21/2017              Requested by: Celene Squibb, MD [place], [place] [address] PCP: Celene Squibb, MD  Chief Complaint  Patient presents with  . Right Foot - Pain      HPI: The patient is a 67 year old woman seen today for evaluation of right forefoot pain. States is concerned she has re broken her foot. Had a 5th MT fracture about a year ago. No known injury. Pain a rest, is worse with ambulation. No swelling or redness.  Assessment & Plan: Visit Diagnoses:  1. Pain in right foot   2. Morton neuroma, right     Plan: encouraged wide toe box shoes. Morton neuroma injected today. Follow up as needed.   Follow-Up Instructions: Return in about 4 weeks (around 01/18/2018), or if symptoms worsen or fail to improve.   Ortho Exam  Patient is alert, oriented, no adenopathy, well-dressed, normal affect, normal respiratory effort. On examination of right foot. Foot is plantigrade. No erythema or swelling. 5th MT is nontender. Does have pain with lateral compression of MT heads. Tender in all 4 webspaces. Is exquisitely tender to the 3rd webspace.   Imaging: Xr Foot Complete Right  Result Date: 12/21/2017 Radiographs of right foot show no acute finding. Healed base of 5th MT fracture seen.   No images are attached to the encounter.  Labs: Lab Results  Component Value Date   REPTSTATUS 08/01/2015 FINAL 07/29/2015   CULT  07/29/2015    50,000 COLONIES/mL ESCHERICHIA COLI Performed at [facility] 07/29/2015    @LABSALLVALUES (HGBA1)@  There is no height or weight on file to calculate BMI.  Orders:  Orders Placed This Encounter  Procedures  . Small Joint Inj  . XR Foot Complete Right   No orders of the defined types were placed in this encounter.    Procedures: Small Joint Inj (3rd webspace)  on 12/21/2017 9:15 AM Indications: pain Details: 25 G needle, dorsal approach Medications: 1 mL lidocaine 1 %; 40 mg methylPREDNISolone acetate 40 MG/ML Consent was given by the patient.      Clinical Data: No additional findings.  ROS:  All other systems negative, except as noted in the HPI. Review of Systems  Constitutional: Negative for chills and fever.  Musculoskeletal: Positive for arthralgias.  Neurological: Negative for weakness and numbness.    Objective: Vital Signs: There were no vitals taken for this visit.  Specialty Comments:  No specialty comments available.  PMFS History: Patient Active Problem List   Diagnosis Date Noted  . Impingement syndrome of right shoulder 09/29/2017  . History of colonic polyps 03/16/2017  . Taking multiple medications for chronic disease 03/16/2017  . Fracture, stress, metatarsal, right, sequela 01/11/2017  . Acute pain of right shoulder 12/16/2016  . Pain in right foot 12/16/2016  . Fracture of base of fifth metatarsal bone with routine healing 11/29/2016  . Humeral head fracture, right, closed, initial encounter 11/17/2016  . Acute respiratory failure with hypercapnia ([place]) 06/16/2015  . Acute respiratory acidosis 06/16/2015  . COPD (chronic obstructive pulmonary disease) ([place]) 06/16/2015  . Narcotic overdose ([place]) 06/15/2015  . Tobacco dependence 05/28/2015  . Acute respiratory failure with hypoxia (  [place]) 05/26/2015  . CAP (community acquired pneumonia) 05/25/2015  . Generalized anxiety disorder 01/22/2014   Past Medical History:  Diagnosis Date  . Anxiety    takes Xanax daily  . Brain tumor (benign) ([place])   . Chronic back pain    bulding disc  . Chronic pain   . COPD (chronic obstructive pulmonary disease) ([place])   . Degenerative disc disease   . Depression    takes Effexor daily  . Disc degeneration   . GERD (gastroesophageal reflux disease)    takes Omeprazole daily  . Headache   . Hepatitis    Hep C  .  History of bronchitis    uses inhaler daily  . History of colon polyps   . History of staph infection   . Hyperlipemia    takes Pravastatin daily  . Hypertension    takes Lotrel daily  . Joint pain   . Narcotic overdose ([place]) 06/13/2015  . Numbness in both hands   . Osteoporosis    Fosamax weekly  . Pinched nerve in shoulder   . Pneumonia 2012  . Substance abuse ([place])    benzos, opiates "from non-prescribed sources"    Family History  Problem Relation Age of Onset  . Bipolar disorder Daughter   . Drug abuse Son   . Drug abuse Grandchild   . Drug abuse Grandchild   . Diabetes Sister   . Cancer Sister   . Colon cancer Neg Hx     Past Surgical History:  Procedure Laterality Date  . ANKLE FRACTURE SURGERY Bilateral early 2000's  . COLONOSCOPY    . COLONOSCOPY WITH PROPOFOL N/A 04/05/2017   Procedure: COLONOSCOPY WITH PROPOFOL;  Surgeon: Danie Binder, MD;  [facility];  Service: Endoscopy;  Laterality: N/A;  1030-rescheduled 4/17 per Tretha Sciara   . FACIAL FRACTURE SURGERY     in the late 90's  . KNEE SURGERY Right   . ORIF CALCANEOUS FRACTURE Right 07/13/2013   Procedure: RIGHT OPEN REDUCTION INTERNAL FIXATION (ORIF) CALCANEUS FRACTURE;  Surgeon: Newt Minion, MD;  Location: [facility];  Service: Orthopedics;  Laterality: Right;  . POLYPECTOMY  04/05/2017   Procedure: POLYPECTOMY;  Surgeon: Danie Binder, MD;  [facility];  Service: Endoscopy;;  colon  . TUBAL LIGATION     Social History   Occupational History  . Not on file  Tobacco Use  . Smoking status: Current Every Day Smoker    Packs/day: 0.50    Years: 40.00    Pack years: 20.00    Types: Cigarettes  . Smokeless tobacco: Never Used  Substance and Sexual Activity  . Alcohol use: No    Comment: denies  . Drug use: No    Comment: Previously used marijuana and cocaine (last use about 25 years ago)  . Sexual activity: Yes    Birth control/protection: Post-menopausal

## 2017-12-26 DIAGNOSIS — I469 Cardiac arrest, cause unspecified: Secondary | ICD-10-CM | POA: Diagnosis not present

## 2018-01-20 DIAGNOSIS — 419620001 Death: Secondary | SNOMED CT | POA: Diagnosis not present

## 2018-01-20 DEATH — deceased
